# Patient Record
Sex: Male | Born: 1960 | Race: Asian | Hispanic: Yes | Marital: Married | Smoking: Never smoker
Health system: Southern US, Community
[De-identification: ages and names within clinical notes are randomized; demographics above are authoritative.]

## PROBLEM LIST (undated history)

## (undated) DIAGNOSIS — D649 Anemia, unspecified: Secondary | ICD-10-CM

## (undated) DIAGNOSIS — E119 Type 2 diabetes mellitus without complications: Secondary | ICD-10-CM

## (undated) DIAGNOSIS — I10 Essential (primary) hypertension: Secondary | ICD-10-CM

## (undated) DIAGNOSIS — N183 Chronic kidney disease, stage 3 unspecified: Secondary | ICD-10-CM

## (undated) DIAGNOSIS — G603 Idiopathic progressive neuropathy: Secondary | ICD-10-CM

## (undated) DIAGNOSIS — N2581 Secondary hyperparathyroidism of renal origin: Secondary | ICD-10-CM

## (undated) HISTORY — DX: Type 2 diabetes mellitus without complications: E11.9

## (undated) HISTORY — DX: Secondary hyperparathyroidism of renal origin: N25.81

## (undated) HISTORY — DX: Chronic kidney disease, stage 3 unspecified: N18.30

## (undated) HISTORY — DX: Essential (primary) hypertension: I10

## (undated) HISTORY — DX: Idiopathic progressive neuropathy: G60.3

## (undated) HISTORY — DX: Anemia, unspecified: D64.9

---

## 2020-06-03 NOTE — Progress Notes (Signed)
Patient referred by Tim Seashore, MD for leg edema, orthostatic hypotension  Subjective:   Tim Spencer, male    DOB: 1961-06-12, 59 y.o.   MRN: [id number]   Chief Complaint  Patient presents with  . Orthostatic Hypotension    With Orthostatic Vitals  . New Patient (Initial Visit)    Dr. Ashby Dawes     HPI  59 y.o. Tim Spencer male with hypertension, CKD 3, idiopathic progressive neuropathy, referred for evaluation of leg edema, orthostatic hypotension  Patient is here with his wife today.  He has been diabetic for over 10 years, has strugglesd with peripheral neuropathy. He denies chest pain, shortness of breath, palpitations, leg edema, orthopnea, PND, TIA/syncope.   He has had significant postural dizziness and significant stops in blood pressure with standing, to as low as 94/64 mmHg. Fortunately, he has never had a fall with it. His blood pressure while supine, is as high as SBP 200s at times, at night. He also had leg edema, which improved with stopping gabapentin but did not change with stopping amlodipine. While the amlodipine was held, the orthostatic hypotension was no better.      Past Medical History:  Diagnosis Date  . Anemia   . CKD (chronic kidney disease) stage 3, GFR 30-59 ml/min   . Hypertension   . Idiopathic progressive neuropathy   . Secondary hyperparathyroidism, renal ([place])   . Type 2 diabetes mellitus ([place])      History reviewed. No pertinent surgical history.   Social History   Tobacco Use  Smoking Status Never Smoker  Smokeless Tobacco Never Used    Social History   Substance and Sexual Activity  Alcohol Use Never     Family History  Problem Relation Age of Onset  . Stroke Mother   . Hypertension Mother   . Diabetes Father      Current Outpatient Medications on File Prior to Visit  Medication Sig Dispense Refill  . amLODipine (NORVASC) 5 MG tablet Take 5 mg by mouth daily.    . Ascorbic Acid (VITAMIN C CR)  500 MG TBCR Take 1 tablet by mouth daily.    Marland Kitchen aspirin EC 81 MG tablet Take 81 mg by mouth daily. Swallow whole.    Marland Kitchen atorvastatin (LIPITOR) 20 MG tablet Take 20 mg by mouth daily.    . Cholecalciferol (VITAMIN D3) 125 MCG (5000 UT) CAPS Take 1 capsule by mouth daily.    . Continuous Blood Gluc Sensor (FREESTYLE LIBRE 2 SENSOR) MISC Inject into the skin every 14 (fourteen) days.    . ferrous sulfate 325 (65 FE) MG tablet Take 325 mg by mouth daily with breakfast.    . gabapentin (NEURONTIN) 300 MG capsule Take 300 mg by mouth 2 (two) times daily.    Marland Kitchen HUMALOG KWIKPEN 100 UNIT/ML KwikPen Inject into the skin as directed.    Marland Kitchen levothyroxine (SYNTHROID) 100 MCG tablet Take 100 mcg by mouth every morning.    Marland Kitchen losartan (COZAAR) 100 MG tablet Take 100 mg by mouth daily.    . Magnesium 250 MG TABS Take 1 tablet by mouth daily.    . metoprolol succinate (TOPROL-XL) 50 MG 24 hr tablet Take 50 mg by mouth daily. Reduce to 1/2 tab daily, then stop (9/13/20210    . Multiple Vitamin (MULTIVITAMIN) tablet Take 1 tablet by mouth daily.    . Multiple Vitamins-Minerals (EQ VISION FORMULA 50+ PO) Take 1 tablet by mouth daily.    . Omega-3 1000 MG CAPS Take  2 capsules by mouth daily.    Nelva Nay SOLOSTAR 300 UNIT/ML Solostar Pen Inject 30 Units into the skin daily. 30    . vitamin B-6 (PYRIDOXINE) 25 MG tablet Take 25 mg by mouth daily.     No current facility-administered medications on file prior to visit.    Cardiovascular and other pertinent studies:  EKG 06/06/2020: Sinus rhythm 70 bpm Occaisonal PVC   Echocardiogram 03/03/2020:  Normal LVEF Mild left atrial enlargement Trace MR, TR. RVSP 23 mmHg   Recent labs: 03/29/2020: Glucose 131, BUN/Cr 24/1.6. EGFR 47. Na/K 135/4.9.  ALB: 3.3 Chol 117, TG 40, HDL 53, LDL 56 TSH 6.48 normal BNP 183    Review of Systems  Cardiovascular: Negative for chest pain, dyspnea on exertion, leg swelling, palpitations and syncope.  Musculoskeletal:        Right toe pain  Neurological: Positive for dizziness and light-headedness.         Vitals:   06/06/20 1258 06/06/20 1259  BP:    Pulse:    Resp:    SpO2: 100% 99%    Orthostatic vitals: Supine: 153/74 mmHg, HR 70/min Sitting: 129/81 mmHg, HR 69/min standing: 121/57 mmHg, HR 81/min    Body mass index is 24.28 kg/m. Filed Weights   06/06/20 1257  Weight: 155 lb (70.3 kg)     Objective:   Physical Exam Vitals and nursing note reviewed.  Constitutional:      General: He is not in acute distress. Neck:     Vascular: No JVD.  Cardiovascular:     Rate and Rhythm: Normal rate and regular rhythm.     Pulses:          Carotid pulses are on the right side with bruit.      Dorsalis pedis pulses are 2+ on the right side and 2+ on the left side.       Posterior tibial pulses are 2+ on the right side and 2+ on the left side.     Heart sounds: Normal heart sounds. No murmur heard.   Pulmonary:     Effort: Pulmonary effort is normal.     Breath sounds: Normal breath sounds. No wheezing or rales.           Assessment & Recommendations:   59 y.o. Tim Spencer male with hypertension, CKD 3, idiopathic progressive neuropathy, with orthostatic hypotension  Orthostatic hypotension/supine hypertension: Likely related to autonomic dysfunction in diabetic patient. Metoprolol and amlodipine, both could contribute.  His orthostatic hypotension did not improve while he was holding amlodipine.  Therefore, I suspect metoprolol could potentially be the culprit.  I recommend following.  Reduce metoprolol succinate to 1/2 tab of 50 mg, and then stop should there be no significant increase in hypertension. Recommend starting clonidine 0.1 mg at night. He would take additional 0.1 mg, should his BP be >180 mmHg at night.   Recommend liberal hydration and using compression stockings.   Right carotid bruit: Recommend carotid US  Toe pain: Do not suspect critical limb ischemia.  Likely musculoskeletal or neuropathic etiology.   F/u in 3-4 weeks   Thank you for referring the patient to [place]. Please feel free to contact with any questions.   Nigel Mormon, MD Pager: [phone] Office: [phone]

## 2020-06-06 ENCOUNTER — Other Ambulatory Visit: Payer: Self-pay

## 2020-06-06 ENCOUNTER — Encounter: Payer: Self-pay | Admitting: Cardiology

## 2020-06-06 ENCOUNTER — Ambulatory Visit: Payer: BC Managed Care – PPO | Admitting: Cardiology

## 2020-06-06 VITALS — BP 171/73 | HR 70 | Resp 16 | Ht 67.0 in | Wt 155.0 lb

## 2020-06-06 DIAGNOSIS — I951 Orthostatic hypotension: Secondary | ICD-10-CM | POA: Insufficient documentation

## 2020-06-06 DIAGNOSIS — I10 Essential (primary) hypertension: Secondary | ICD-10-CM

## 2020-06-06 DIAGNOSIS — R0989 Other specified symptoms and signs involving the circulatory and respiratory systems: Secondary | ICD-10-CM

## 2020-06-06 MED ORDER — CLONIDINE HCL 0.1 MG PO TABS: 0.1000 mg | ORAL_TABLET | Freq: Every day | ORAL | 2 refills | Status: DC

## 2020-06-07 ENCOUNTER — Other Ambulatory Visit: Payer: Self-pay

## 2020-06-07 DIAGNOSIS — I951 Orthostatic hypotension: Secondary | ICD-10-CM

## 2020-06-07 MED ORDER — CLONIDINE HCL 0.1 MG PO TABS: 0.1000 mg | ORAL_TABLET | Freq: Every day | ORAL | 2 refills | Status: DC

## 2020-06-20 ENCOUNTER — Ambulatory Visit: Payer: BC Managed Care – PPO

## 2020-06-20 ENCOUNTER — Other Ambulatory Visit: Payer: Self-pay

## 2020-06-20 DIAGNOSIS — R0989 Other specified symptoms and signs involving the circulatory and respiratory systems: Secondary | ICD-10-CM

## 2020-06-22 NOTE — Progress Notes (Signed)
Called and spoke with patient regarding his CAD results.

## 2020-07-04 ENCOUNTER — Telehealth: Payer: Self-pay

## 2020-07-04 DIAGNOSIS — E871 Hypo-osmolality and hyponatremia: Secondary | ICD-10-CM

## 2020-07-04 DIAGNOSIS — I951 Orthostatic hypotension: Secondary | ICD-10-CM

## 2020-07-04 DIAGNOSIS — I10 Essential (primary) hypertension: Secondary | ICD-10-CM

## 2020-07-04 NOTE — Telephone Encounter (Signed)
Patient called wanting to speak to you eagerly regarding his sodium levels patient states he had an appt with his pcp and they told him his levels were low patient is wanting to know if he can take sodium due to him having hypertension and not controlled patient would like to speak to you personally

## 2020-07-05 DIAGNOSIS — E871 Hypo-osmolality and hyponatremia: Secondary | ICD-10-CM | POA: Insufficient documentation

## 2020-07-05 MED ORDER — HYDRALAZINE HCL 25 MG PO TABS: 25.0000 mg | ORAL_TABLET | Freq: Three times a day (TID) | ORAL | 2 refills | Status: DC | PRN

## 2020-07-05 NOTE — Telephone Encounter (Addendum)
I had a long conversation with the patient.  It appears that his serum sodium level is down to 125, based on recent blood work obtained by his PCP.  Patient is very concerned about this finding.  Etiology is include possible SIADH, or medication induced hyponatremia from clonidine.  Patient is also very concerned about his elevated blood pressure readings, especially while laying down.  I have recommended him the following.  Reduce clonidine from 0.1 mg twice daily in the evening, to once daily.  Take losartan 100 mg in the evening, instead of morning.  Should he have systolic blood pressure greater than 180 mmHg while sitting or standing, he may take hydralazine 25 mg 3 times daily as needed.  I will obtain labs from PCP.  I also recommend repeating BMP on 07/18/2020 prior to his appointment with me on 07/21/2020.  I will forward our conversation to his PCP Dr. Ashby Dawes, nephrologist Dr. Elmarie Shiley, and endocrinologist Dr. Amalia Greenhouse.  Thanks MJP  Staff, please obtain labs from PCP, and forward this encounter to the physicians mentioned above. Thanks.  Time spent: 10 min   --------------------------------------------------  Addendum 07/06/2020:  06/30/2020: Glucose 169, BUN/Cr 26/2.0. EGFR 36. Na/K 125/5.5. Chloride 92. Ca 10.1. Rest of the CMP normal H/H 10.6/29.8. MCV 84. Platelets 262 HbA1C 7.3% Chol 99, TG 83, HDL 41, LDL 45 TSH 11.2 high  05/10/2020: Glucose 230, BUN/Cr 33/1.84. EGFR 39. Na/K 127/5.3. Cl 92.   03/29/2020: BNP 183  I received further labs from Dr. Mathis Fare office. Review of above labs suggests that his hyponatremia predates initiation of clonidine. Thus, less likely to be the primary etiology. His BNP was 183 in 03/2020, however clinical exam and echocardiogram do not suggest florid heart failure, and therefore, hypervolemic or dilutaitonal hyponatremia. His CKD has progressed, Cr now 2.0. He has multiple electrolyte abnormalities, that may be at least  partially related to his CKD. I would request Dr. Elmarie Shiley to guide with management of this situation.    Nigel Mormon, MD Pager: [phone] Office: [phone]

## 2020-07-06 ENCOUNTER — Other Ambulatory Visit: Payer: Self-pay

## 2020-07-07 NOTE — Telephone Encounter (Signed)
Thank you :)

## 2020-07-07 NOTE — Telephone Encounter (Signed)
This has been completed.   Tim Spencer

## 2020-07-21 ENCOUNTER — Other Ambulatory Visit: Payer: Self-pay

## 2020-07-21 ENCOUNTER — Ambulatory Visit: Payer: BC Managed Care – PPO | Admitting: Cardiology

## 2020-07-21 ENCOUNTER — Encounter: Payer: Self-pay | Admitting: Cardiology

## 2020-07-21 VITALS — Resp 17 | Ht 67.0 in | Wt 161.0 lb

## 2020-07-21 DIAGNOSIS — I951 Orthostatic hypotension: Secondary | ICD-10-CM

## 2020-07-21 DIAGNOSIS — E871 Hypo-osmolality and hyponatremia: Secondary | ICD-10-CM

## 2020-07-21 DIAGNOSIS — I10 Essential (primary) hypertension: Secondary | ICD-10-CM

## 2020-07-21 MED ORDER — FUROSEMIDE 20 MG PO TABS: 20.0000 mg | ORAL_TABLET | Freq: Every day | ORAL | 3 refills | Status: DC

## 2020-07-21 MED ORDER — HYDRALAZINE HCL 25 MG PO TABS: 25.0000 mg | ORAL_TABLET | Freq: Three times a day (TID) | ORAL | 2 refills | Status: DC

## 2020-07-21 NOTE — Progress Notes (Signed)
Patient referred by Tim Seashore, MD for leg edema, orthostatic hypotension  Subjective:   Tim Spencer, male    DOB: 1961/08/09, 59 y.o.   MRN: [id number]   Chief Complaint  Patient presents with  . hyponatremia  . Follow-up    3 -4 week     HPI  59 y.o. Tim Spencer male with hypertension, CKD 3, idiopathic progressive neuropathy, orthostatic hypotension/ supine hypertension, hyponatremia.  Hypertension chronic renal, and detailed, contacted patient concerning his hyponatremia.  Recommended him to reduce clonidine.  Subsequent lab work shows increase in sodium from 125 to 130, and improvement in creatinine from 2.0-1.6.  Serum osmolality is reduced at 267.  Patient has noticed recent leg edema, after he increased his sodium intake, as he felt that would increase his serum sodium level.  Home blood pressure log reviewed.  Blood pressure ranges from 1 40-80/70/90 mmHg.  Patient has a lot of anxiety related to his blood pressure numbers.  He constantly worries about his hypertension causing him stroke and what that would mean for his wife.  On a separate not, he recently had an equivocal cologuard test. He is going to see gastroenterologist Dr. Collene Spencer  Please see telephone encounter 06/30/2020: I had a long conversation with the patient.  It appears that his serum sodium level is down to 125, based on recent blood work obtained by his PCP.  Patient is very concerned about this finding.  Etiology is include possible SIADH, or medication induced hyponatremia from clonidine.  Patient is also very concerned about his elevated blood pressure readings, especially while laying down.  I have recommended him the following.  Reduce clonidine from 0.1 mg twice daily in the evening, to once daily.  Take losartan 100 mg in the evening, instead of morning.  Should he have systolic blood pressure greater than 180 mmHg while sitting or standing, he may take hydralazine 25 mg 3 times daily  as needed.  I will obtain labs from PCP.  I also recommend repeating BMP on 07/18/2020 prior to his appointment with me on 07/21/2020.  I will forward our conversation to his PCP Dr. Ashby Spencer, nephrologist Dr. Elmarie Spencer, and endocrinologist Dr. Amalia Spencer.  Thanks MJP  Staff, please obtain labs from PCP, and forward this encounter to the physicians mentioned above. Thanks.  Time spent: 10 min   --------------------------------------------------  Addendum 07/06/2020:  06/30/2020: Glucose 169, BUN/Cr 26/2.0. EGFR 36. Na/K 125/5.5. Chloride 92. Ca 10.1. Rest of the CMP normal H/H 10.6/29.8. MCV 84. Platelets 262 HbA1C 7.3% Chol 99, TG 83, HDL 41, LDL 45 TSH 11.2 high  05/10/2020: Glucose 230, BUN/Cr 33/1.84. EGFR 39. Na/K 127/5.3. Cl 92.   03/29/2020: BNP 183  I received further labs from Dr. Mathis Spencer office. Review of above labs suggests that his hyponatremia predates initiation of clonidine. Thus, less likely to be the primary etiology. His BNP was 183 in 03/2020, however clinical exam and echocardiogram do not suggest florid heart failure, and therefore, hypervolemic or dilutaitonal hyponatremia. His CKD has progressed, Cr now 2.0. He has multiple electrolyte abnormalities, that may be at least partially related to his CKD. I would request Dr. Elmarie Spencer to guide with management of this situation.   Initial consultation HPI 05/2020:  Patient is here with his wife today.  He has been diabetic for over 10 years, has strugglesd with peripheral neuropathy. He denies chest pain, shortness of breath, palpitations, leg edema, orthopnea, PND, TIA/syncope.   He has had significant postural dizziness and  significant stops in blood pressure with standing, to as low as 94/64 mmHg. Fortunately, he has never had a fall with it. His blood pressure while supine, is as high as SBP 200s at times, at night. He also had leg edema, which improved with stopping gabapentin but did not  change with stopping amlodipine. While the amlodipine was held, the orthostatic hypotension was no better.      Current Outpatient Medications on File Prior to Visit  Medication Sig Dispense Refill  . amLODipine (NORVASC) 5 MG tablet Take 5 mg by mouth in the morning and at bedtime.     . Ascorbic Acid (VITAMIN C CR) 500 MG TBCR Take 1 tablet by mouth daily.    Marland Kitchen aspirin EC 81 MG tablet Take 81 mg by mouth daily. Swallow whole.    Marland Kitchen atorvastatin (LIPITOR) 20 MG tablet Take 20 mg by mouth daily.    . cloNIDine (CATAPRES) 0.1 MG tablet Take 1 tablet (0.1 mg total) by mouth daily. Take 0.1 mg at night. May take additional 0.1 mg at night,>180 90 tablet 2  . Continuous Blood Gluc Sensor (FREESTYLE LIBRE 2 SENSOR) MISC Inject into the skin every 14 (fourteen) days.    . ferrous sulfate 325 (65 FE) MG tablet Take 325 mg by mouth daily with breakfast.    . gabapentin (NEURONTIN) 300 MG capsule Take 300 mg by mouth 2 (two) times daily.    Marland Kitchen HUMALOG KWIKPEN 100 UNIT/ML KwikPen Inject into the skin as directed.    . hydrALAZINE (APRESOLINE) 25 MG tablet Take 1 tablet (25 mg total) by mouth 3 (three) times daily as needed. 90 tablet 2  . levothyroxine (SYNTHROID) 125 MCG tablet Take 125 mcg by mouth every morning.     Marland Kitchen losartan (COZAAR) 100 MG tablet Take 100 mg by mouth daily.    . Magnesium 250 MG TABS Take 1 tablet by mouth daily.    . metoprolol succinate (TOPROL-XL) 50 MG 24 hr tablet Take 50 mg by mouth daily. Reduce to 1/2 tab daily, then stop (9/13/20210    . Multiple Vitamin (MULTIVITAMIN) tablet Take 1 tablet by mouth daily.    . Multiple Vitamins-Minerals (EQ VISION FORMULA 50+ PO) Take 1 tablet by mouth daily.    . Omega-3 1000 MG CAPS Take 2 capsules by mouth daily.    Nelva Nay SOLOSTAR 300 UNIT/ML Solostar Pen Inject 30 Units into the skin daily. 30    . vitamin B-6 (PYRIDOXINE) 25 MG tablet Take 25 mg by mouth daily.     No current facility-administered medications on file prior to  visit.    Cardiovascular and other pertinent studies:  Carotid artery duplex 06/20/2020:  Minimal stenosis in the right internal carotid artery (1-15%).  Minimal stenosis in the left internal carotid artery (1-15%).  Mild heterogeneous plaque noted in R>L carotid arteries.  Antegrade right vertebral artery flow. Antegrade left vertebral artery  Flow.  EKG 06/06/2020: Sinus rhythm 70 bpm Occaisonal PVC   Echocardiogram 03/03/2020:  Normal LVEF Mild left atrial enlargement Trace MR, TR. RVSP 23 mmHg   Recent labs: 07/18/2020: Glucose 130 BUN/creatinine 27/1.6.  eGFR 47/56.  NA/K 130/4.5. H/H 10.6/29.8 MCV 84.  Platelets 262. Serum osmolality 267 (275-295)  06/30/2020: Glucose 169, BUN/Cr 26/2.0. EGFR 36. Na/K 125/5.5. Chloride 92. Ca 10.1. Rest of the CMP normal H/H 10.6/29.8. MCV 84. Platelets 262 HbA1C 7.3% Chol 99, TG 83, HDL 41, LDL 45 TSH 11.2 high  05/10/2020: Glucose 230, BUN/Cr 33/1.84. EGFR 39. Na/K 127/5.3.  Cl 92.   03/29/2020: BNP 183  03/29/2020: Glucose 131, BUN/Cr 24/1.6. EGFR 47. Na/K 135/4.9.  ALB: 3.3 Chol 117, TG 40, HDL 53, LDL 56 TSH 6.48 normal BNP 183    Review of Systems  Cardiovascular: Negative for chest pain, dyspnea on exertion, leg swelling, palpitations and syncope.  Musculoskeletal:       Right toe pain  Neurological: Positive for dizziness and light-headedness.         Vitals:   07/21/20 0948 07/21/20 0949  Resp:    SpO2: 98% 98%    Orthostatic VS for the past 72 hrs (Last 3 readings):  Orthostatic BP Patient Position BP Location Cuff Size Orthostatic Pulse  07/21/20 0949 164/73 Standing Right Arm Normal 81  07/21/20 0948 175/83 Sitting Right Arm Normal 79  07/21/20 0947 185/89 Supine Right Arm Normal 87      Body mass index is 25.22 kg/m. Filed Weights   07/21/20 0947  Weight: 161 lb (73 kg)     Objective:   Physical Exam Vitals and nursing note reviewed.  Constitutional:      General: He is not in  acute distress. Neck:     Vascular: No JVD.  Cardiovascular:     Rate and Rhythm: Normal rate and regular rhythm.     Pulses:          Carotid pulses are on the right side with bruit.      Dorsalis pedis pulses are 2+ on the right side and 2+ on the left side.       Posterior tibial pulses are 2+ on the right side and 2+ on the left side.     Heart sounds: Normal heart sounds. No murmur heard.   Pulmonary:     Effort: Pulmonary effort is normal.     Breath sounds: Normal breath sounds. No wheezing or rales.           Assessment & Recommendations:   59 y.o. Tim Spencer male with hypertension, CKD 3, idiopathic progressive neuropathy, orthostatic hypotension/ supine hypertension, hyponatremia.  Orthostatic hypotension/supine hypertension: Likely related to autonomic dysfunction in diabetic patient. Metoprolol and amlodipine, both could contribute.  His orthostatic hypotension did not improve while he was holding amlodipine.  Difficult to stop metoprolol when his BP still remains elevated.  Started hydralazine 25 mg tid.  Take losartan, metoprolol, and clonidine at night, amlodipine in the morning. He know sto take additional 1-2 25 mg doses of hydralazome   Hyponatremia: Likely dilutional. While his echocardiogram was unremarkable, leg edema, elevated BNP, and low sr osmolality point towards hyponatremia related to volume overload. Added lasix 20 mg daily. Repeat BMP, BNP, Sr osmolality in 3 weeks  On a separate note, I have asked him to stop Aspirin, given equivocal cologuard test. In absence of known ASCVD, risks of Aspirin outweigh benefits.  F/u in 4 weeks  Time spent: 27 min   Nigel Mormon, MD Pager: [phone] Office: [phone]

## 2020-08-25 ENCOUNTER — Ambulatory Visit: Payer: BC Managed Care – PPO | Admitting: Cardiology

## 2020-08-29 ENCOUNTER — Ambulatory Visit: Payer: BC Managed Care – PPO | Admitting: Cardiology

## 2020-08-29 ENCOUNTER — Telehealth: Payer: Self-pay

## 2020-08-29 NOTE — Telephone Encounter (Signed)
Telephone encounter:  Reason for call: patient called to ask if you still need to see him . He said that his sodium level is now 141.   Usual provider: MP  Last office visit: 07/21/20  Next office visit: NA   Last hospitalization:NA  Current Outpatient Medications on File Prior to Visit  Medication Sig Dispense Refill  . amLODipine (NORVASC) 5 MG tablet Take 5 mg by mouth every evening.     . Ascorbic Acid (VITAMIN C CR) 500 MG TBCR Take 500 mg by mouth daily after lunch.     Marland Kitchen atorvastatin (LIPITOR) 20 MG tablet Take 20 mg by mouth daily after lunch.     . B Complex-C (B-COMPLEX WITH VITAMIN C) tablet Take 1 tablet by mouth daily after supper.    . cloNIDine (CATAPRES) 0.1 MG tablet Take 1 tablet (0.1 mg total) by mouth daily. Take 0.1 mg at night. May take additional 0.1 mg at night,>180 (Patient taking differently: Take 0.1 mg by mouth every evening. ) 90 tablet 2  . Continuous Blood Gluc Sensor (FREESTYLE LIBRE 2 SENSOR) MISC Inject into the skin every 14 (fourteen) days.    . ferrous sulfate 325 (65 FE) MG tablet Take 650 mg by mouth daily after supper.     . furosemide (LASIX) 20 MG tablet Take 1 tablet (20 mg total) by mouth daily. (Patient taking differently: Take 20 mg by mouth daily after lunch. ) 90 tablet 3  . HUMALOG KWIKPEN 100 UNIT/ML KwikPen Inject 10-15 Units into the skin 3 (three) times daily before meals. Sliding Scale Insulin    . hydrALAZINE (APRESOLINE) 25 MG tablet Take 1 tablet (25 mg total) by mouth 3 (three) times daily. 270 tablet 2  . levothyroxine (SYNTHROID) 125 MCG tablet Take 125 mcg by mouth daily before breakfast.     . losartan (COZAAR) 100 MG tablet Take 100 mg by mouth daily.    . magnesium oxide (MAG-OX) 400 MG tablet Take 400 mg by mouth daily after lunch.    . metoprolol succinate (TOPROL-XL) 50 MG 24 hr tablet Take 25 mg by mouth daily.     . Multiple Vitamin (MULTIVITAMIN WITH MINERALS) TABS tablet Take 1 tablet by mouth daily after lunch.    .  Multiple Vitamins-Minerals (EQ VISION FORMULA 50+ PO) Take 1 tablet by mouth every evening.     . Omega-3 1000 MG CAPS Take 2,000 mg by mouth at bedtime.     Nelva Nay SOLOSTAR 300 UNIT/ML Solostar Pen Inject 30 Units into the skin daily. 30    . vitamin B-12 (CYANOCOBALAMIN) 1000 MCG tablet Take 1,000 mcg by mouth daily.     No current facility-administered medications on file prior to visit.

## 2020-08-29 NOTE — Telephone Encounter (Signed)
I leave it to the patient. Either is fine with me.  Thanks MJP

## 2020-08-30 NOTE — Progress Notes (Signed)
Mr/ Posey Pronto left a voice message asking to cal him, he has questions. I called Mr. Degroat back and informed him that he needs to call Dr. Lorie Apley office with questions, that our department does not work with the procedure he is having. "I talked to someone at [facility] for those questions, I just nee to Know if there is valet parking and if there is a wheel chair I can use when I arrive."   I said yes to both questions.

## 2020-09-01 ENCOUNTER — Encounter (HOSPITAL_COMMUNITY)
Admission: RE | Disposition: A | Discharge status: Home / Self Care | Payer: Self-pay | Source: Home / Self Care | Attending: Gastroenterology

## 2020-09-01 ENCOUNTER — Ambulatory Visit (HOSPITAL_COMMUNITY)
Admission: RE | Admit: 2020-09-01 | Discharge: 2020-09-01 | Disposition: A | Discharge status: Home / Self Care | Payer: BC Managed Care – PPO | Attending: Gastroenterology | Admitting: Gastroenterology

## 2020-09-01 DIAGNOSIS — K633 Ulcer of intestine: Secondary | ICD-10-CM | POA: Diagnosis not present

## 2020-09-01 DIAGNOSIS — D509 Iron deficiency anemia, unspecified: Secondary | ICD-10-CM | POA: Diagnosis not present

## 2020-09-01 HISTORY — PX: GIVENS CAPSULE STUDY: SHX5432

## 2020-09-01 SURGERY — IMAGING PROCEDURE, GI TRACT, INTRALUMINAL, VIA CAPSULE

## 2020-09-01 SURGICAL SUPPLY — 1 items: TOWEL COTTON PACK 4EA (MISCELLANEOUS) ×6 IMPLANT

## 2020-09-01 NOTE — Progress Notes (Signed)
Pt scheduled for Givens capsule study at [facility].  Givens capsule ingested at 7:45am.  No issues swallowing.  Instructions given.  Pt verbalized understanding.  Pt to return belt and monitor to [facility] later today or tomorrow.

## 2020-09-04 ENCOUNTER — Encounter (HOSPITAL_COMMUNITY): Payer: Self-pay | Admitting: Gastroenterology

## 2020-09-13 ENCOUNTER — Other Ambulatory Visit: Payer: Self-pay | Admitting: Gastroenterology

## 2020-09-13 DIAGNOSIS — K6389 Other specified diseases of intestine: Secondary | ICD-10-CM

## 2020-09-13 DIAGNOSIS — D372 Neoplasm of uncertain behavior of small intestine: Secondary | ICD-10-CM

## 2020-09-14 ENCOUNTER — Other Ambulatory Visit: Payer: Self-pay

## 2020-09-14 ENCOUNTER — Ambulatory Visit
Admission: RE | Admit: 2020-09-14 | Discharge: 2020-09-14 | Disposition: A | Discharge status: Home / Self Care | Payer: BC Managed Care – PPO | Source: Ambulatory Visit | Attending: Gastroenterology | Admitting: Gastroenterology

## 2020-09-14 DIAGNOSIS — K6389 Other specified diseases of intestine: Secondary | ICD-10-CM

## 2020-09-14 MED ORDER — IOPAMIDOL (ISOVUE-300) INJECTION 61%
100.0000 mL | Freq: Once | INTRAVENOUS | Status: AC | PRN
  Administered 2020-09-14: 09:00:00 100 mL via INTRAVENOUS

## 2020-09-21 ENCOUNTER — Ambulatory Visit: Payer: BC Managed Care – PPO | Admitting: Cardiology

## 2021-06-28 ENCOUNTER — Inpatient Hospital Stay (HOSPITAL_COMMUNITY)
Admission: EM | Admit: 2021-06-28 | Discharge: 2021-07-01 | DRG: 641 | Disposition: A | Discharge status: Home / Self Care | Payer: BC Managed Care – PPO | Attending: Internal Medicine | Admitting: Internal Medicine

## 2021-06-28 ENCOUNTER — Other Ambulatory Visit: Payer: Self-pay

## 2021-06-28 ENCOUNTER — Encounter (HOSPITAL_COMMUNITY): Payer: Self-pay | Admitting: Oncology

## 2021-06-28 DIAGNOSIS — E871 Hypo-osmolality and hyponatremia: Principal | ICD-10-CM | POA: Diagnosis present

## 2021-06-28 DIAGNOSIS — E1122 Type 2 diabetes mellitus with diabetic chronic kidney disease: Secondary | ICD-10-CM | POA: Diagnosis present

## 2021-06-28 DIAGNOSIS — E119 Type 2 diabetes mellitus without complications: Secondary | ICD-10-CM | POA: Diagnosis not present

## 2021-06-28 DIAGNOSIS — I10 Essential (primary) hypertension: Secondary | ICD-10-CM

## 2021-06-28 DIAGNOSIS — N2581 Secondary hyperparathyroidism of renal origin: Secondary | ICD-10-CM | POA: Diagnosis present

## 2021-06-28 DIAGNOSIS — E861 Hypovolemia: Secondary | ICD-10-CM | POA: Diagnosis present

## 2021-06-28 DIAGNOSIS — Z23 Encounter for immunization: Secondary | ICD-10-CM

## 2021-06-28 DIAGNOSIS — E1165 Type 2 diabetes mellitus with hyperglycemia: Secondary | ICD-10-CM | POA: Diagnosis present

## 2021-06-28 DIAGNOSIS — G603 Idiopathic progressive neuropathy: Secondary | ICD-10-CM | POA: Diagnosis present

## 2021-06-28 DIAGNOSIS — Z833 Family history of diabetes mellitus: Secondary | ICD-10-CM

## 2021-06-28 DIAGNOSIS — I129 Hypertensive chronic kidney disease with stage 1 through stage 4 chronic kidney disease, or unspecified chronic kidney disease: Secondary | ICD-10-CM | POA: Diagnosis present

## 2021-06-28 DIAGNOSIS — Z20822 Contact with and (suspected) exposure to covid-19: Secondary | ICD-10-CM | POA: Diagnosis present

## 2021-06-28 DIAGNOSIS — Z8249 Family history of ischemic heart disease and other diseases of the circulatory system: Secondary | ICD-10-CM

## 2021-06-28 DIAGNOSIS — I160 Hypertensive urgency: Secondary | ICD-10-CM | POA: Diagnosis not present

## 2021-06-28 DIAGNOSIS — N1831 Chronic kidney disease, stage 3a: Secondary | ICD-10-CM | POA: Diagnosis not present

## 2021-06-28 DIAGNOSIS — Z794 Long term (current) use of insulin: Secondary | ICD-10-CM

## 2021-06-28 DIAGNOSIS — Z79890 Hormone replacement therapy (postmenopausal): Secondary | ICD-10-CM

## 2021-06-28 DIAGNOSIS — Z79899 Other long term (current) drug therapy: Secondary | ICD-10-CM

## 2021-06-28 DIAGNOSIS — E039 Hypothyroidism, unspecified: Secondary | ICD-10-CM | POA: Diagnosis present

## 2021-06-28 DIAGNOSIS — G4700 Insomnia, unspecified: Secondary | ICD-10-CM | POA: Diagnosis present

## 2021-06-28 DIAGNOSIS — R350 Frequency of micturition: Secondary | ICD-10-CM | POA: Diagnosis present

## 2021-06-28 DIAGNOSIS — D649 Anemia, unspecified: Secondary | ICD-10-CM | POA: Diagnosis present

## 2021-06-28 LAB — CBC WITH DIFFERENTIAL/PLATELET
Abs Immature Granulocytes: 0.03 10*3/uL (ref 0.00–0.07)
Basophils Absolute: 0 10*3/uL (ref 0.0–0.1)
Basophils Relative: 0 %
Eosinophils Absolute: 0 10*3/uL (ref 0.0–0.5)
Eosinophils Relative: 0 %
HCT: 32.4 % — ABNORMAL LOW (ref 39.0–52.0)
Hemoglobin: 11.3 g/dL — ABNORMAL LOW (ref 13.0–17.0)
Immature Granulocytes: 0 %
Lymphocytes Relative: 19 %
Lymphs Abs: 1.8 10*3/uL (ref 0.7–4.0)
MCH: 30.6 pg (ref 26.0–34.0)
MCHC: 34.9 g/dL (ref 30.0–36.0)
MCV: 87.8 fL (ref 80.0–100.0)
Monocytes Absolute: 0.8 10*3/uL (ref 0.1–1.0)
Monocytes Relative: 8 %
Neutro Abs: 7.1 10*3/uL (ref 1.7–7.7)
Neutrophils Relative %: 73 %
Platelets: 233 10*3/uL (ref 150–400)
RBC: 3.69 MIL/uL — ABNORMAL LOW (ref 4.22–5.81)
RDW: 11.9 % (ref 11.5–15.5)
WBC: 9.7 10*3/uL (ref 4.0–10.5)
nRBC: 0 % (ref 0.0–0.2)

## 2021-06-28 LAB — URINALYSIS, ROUTINE W REFLEX MICROSCOPIC
Bacteria, UA: NONE SEEN
Bilirubin Urine: NEGATIVE
Glucose, UA: 150 mg/dL — AB
Hgb urine dipstick: NEGATIVE
Ketones, ur: 5 mg/dL — AB
Leukocytes,Ua: NEGATIVE
Nitrite: NEGATIVE
Protein, ur: >=300 mg/dL — AB
Specific Gravity, Urine: 1.009 (ref 1.005–1.030)
pH: 7 (ref 5.0–8.0)

## 2021-06-28 LAB — RESP PANEL BY RT-PCR (FLU A&B, COVID) ARPGX2
Influenza A by PCR: NEGATIVE
Influenza B by PCR: NEGATIVE
SARS Coronavirus 2 by RT PCR: NEGATIVE

## 2021-06-28 LAB — COMPREHENSIVE METABOLIC PANEL
ALT: 28 U/L (ref 0–44)
AST: 40 U/L (ref 15–41)
Albumin: 3.4 g/dL — ABNORMAL LOW (ref 3.5–5.0)
Alkaline Phosphatase: 55 U/L (ref 38–126)
Anion gap: 11 (ref 5–15)
BUN: 21 mg/dL (ref 8–23)
CO2: 21 mmol/L — ABNORMAL LOW (ref 22–32)
Calcium: 10 mg/dL (ref 8.9–10.3)
Chloride: 91 mmol/L — ABNORMAL LOW (ref 98–111)
Creatinine, Ser: 1.73 mg/dL — ABNORMAL HIGH (ref 0.61–1.24)
GFR, Estimated: 44 mL/min — ABNORMAL LOW (ref >60)
Glucose, Bld: 221 mg/dL — ABNORMAL HIGH (ref 70–99)
Potassium: 4.2 mmol/L (ref 3.5–5.1)
Sodium: 123 mmol/L — ABNORMAL LOW (ref 135–145)
Total Bilirubin: 0.9 mg/dL (ref 0.3–1.2)
Total Protein: 6.6 g/dL (ref 6.5–8.1)

## 2021-06-28 LAB — LACTIC ACID, PLASMA: Lactic Acid, Venous: 1.2 mmol/L (ref 0.5–1.9)

## 2021-06-28 LAB — CBG MONITORING, ED
Glucose-Capillary: 185 mg/dL — ABNORMAL HIGH (ref 70–99)
Glucose-Capillary: 299 mg/dL — ABNORMAL HIGH (ref 70–99)

## 2021-06-28 LAB — ETHANOL: Alcohol, Ethyl (B): <10 mg/dL (ref <10)

## 2021-06-28 LAB — BRAIN NATRIURETIC PEPTIDE: B Natriuretic Peptide: 429.3 pg/mL — ABNORMAL HIGH (ref 0.0–100.0)

## 2021-06-28 MED ORDER — ONDANSETRON 8 MG PO TBDP
8.0000 mg | ORAL_TABLET | Freq: Once | ORAL | Status: AC
  Administered 2021-06-28: 8 mg via ORAL
  Filled 2021-06-28: qty 1

## 2021-06-28 MED ORDER — SODIUM CHLORIDE 0.9 % IV SOLN: INTRAVENOUS | Status: AC

## 2021-06-28 MED ORDER — HYDRALAZINE HCL 25 MG PO TABS
25.0000 mg | ORAL_TABLET | Freq: Four times a day (QID) | ORAL | Status: DC | PRN
  Administered 2021-06-28 – 2021-06-29 (×3): 25 mg via ORAL
  Filled 2021-06-28 (×2): qty 1

## 2021-06-28 MED ORDER — SENNOSIDES-DOCUSATE SODIUM 8.6-50 MG PO TABS
1.0000 | ORAL_TABLET | Freq: Every evening | ORAL | Status: DC | PRN
  Administered 2021-07-01: 1 via ORAL
  Filled 2021-06-28: qty 1

## 2021-06-28 MED ORDER — ATORVASTATIN CALCIUM 10 MG PO TABS
20.0000 mg | ORAL_TABLET | Freq: Every day | ORAL | Status: DC
  Administered 2021-06-29 – 2021-07-01 (×3): 20 mg via ORAL
  Filled 2021-06-28 (×3): qty 2

## 2021-06-28 MED ORDER — DILTIAZEM HCL 60 MG PO TABS
60.0000 mg | ORAL_TABLET | Freq: Every day | ORAL | Status: DC
  Administered 2021-06-29: 60 mg via ORAL
  Filled 2021-06-28: qty 2
  Filled 2021-06-28: qty 1

## 2021-06-28 MED ORDER — ACETAMINOPHEN 325 MG PO TABS: 650.0000 mg | ORAL_TABLET | Freq: Four times a day (QID) | ORAL | Status: DC | PRN

## 2021-06-28 MED ORDER — INSULIN GLARGINE-YFGN 100 UNIT/ML ~~LOC~~ SOLN
15.0000 [IU] | Freq: Every day | SUBCUTANEOUS | Status: DC
  Administered 2021-06-29: 15 [IU] via SUBCUTANEOUS
  Filled 2021-06-28: qty 0.15

## 2021-06-28 MED ORDER — LEVOTHYROXINE SODIUM 125 MCG PO TABS
125.0000 ug | ORAL_TABLET | Freq: Every day | ORAL | Status: DC
  Administered 2021-06-29 – 2021-07-01 (×3): 125 ug via ORAL
  Filled 2021-06-28 (×3): qty 1

## 2021-06-28 MED ORDER — ENOXAPARIN SODIUM 40 MG/0.4ML IJ SOSY
40.0000 mg | PREFILLED_SYRINGE | INTRAMUSCULAR | Status: DC
  Administered 2021-06-28 – 2021-06-30 (×3): 40 mg via SUBCUTANEOUS
  Filled 2021-06-28 (×2): qty 0.4

## 2021-06-28 MED ORDER — CLONIDINE HCL 0.1 MG PO TABS
0.1000 mg | ORAL_TABLET | Freq: Every evening | ORAL | Status: DC
  Administered 2021-06-29: 0.1 mg via ORAL
  Filled 2021-06-28: qty 1

## 2021-06-28 MED ORDER — AMLODIPINE BESYLATE 5 MG PO TABS: 5.0000 mg | ORAL_TABLET | Freq: Every evening | ORAL | Status: DC

## 2021-06-28 MED ORDER — INSULIN ASPART 100 UNIT/ML IJ SOLN
0.0000 [IU] | Freq: Every day | INTRAMUSCULAR | Status: DC
  Administered 2021-06-28 – 2021-06-30 (×3): 3 [IU] via SUBCUTANEOUS
  Filled 2021-06-28: qty 0.05

## 2021-06-28 MED ORDER — GABAPENTIN 300 MG PO CAPS
300.0000 mg | ORAL_CAPSULE | Freq: Three times a day (TID) | ORAL | Status: DC
  Administered 2021-06-28 – 2021-07-01 (×9): 300 mg via ORAL
  Filled 2021-06-28 (×9): qty 1

## 2021-06-28 MED ORDER — INSULIN GLARGINE-YFGN 100 UNIT/ML ~~LOC~~ SOLN
15.0000 [IU] | Freq: Every day | SUBCUTANEOUS | Status: DC
  Filled 2021-06-28: qty 0.15

## 2021-06-28 MED ORDER — LABETALOL HCL 5 MG/ML IV SOLN
10.0000 mg | Freq: Once | INTRAVENOUS | Status: AC
  Administered 2021-06-28: 10 mg via INTRAVENOUS
  Filled 2021-06-28: qty 4

## 2021-06-28 MED ORDER — SODIUM CHLORIDE 0.9 % IV SOLN: Freq: Once | INTRAVENOUS | Status: DC

## 2021-06-28 MED ORDER — ACETAMINOPHEN 650 MG RE SUPP: 650.0000 mg | Freq: Four times a day (QID) | RECTAL | Status: DC | PRN

## 2021-06-28 MED ORDER — INFLUENZA VAC SPLIT QUAD 0.5 ML IM SUSY
0.5000 mL | PREFILLED_SYRINGE | INTRAMUSCULAR | Status: AC
  Administered 2021-06-29: 0.5 mL via INTRAMUSCULAR
  Filled 2021-06-28: qty 0.5

## 2021-06-28 MED ORDER — LORAZEPAM 0.5 MG PO TABS
0.5000 mg | ORAL_TABLET | Freq: Every evening | ORAL | Status: DC | PRN
  Administered 2021-06-28: 0.5 mg via ORAL
  Filled 2021-06-28: qty 1

## 2021-06-28 MED ORDER — METOPROLOL SUCCINATE ER 25 MG PO TB24
25.0000 mg | ORAL_TABLET | Freq: Every day | ORAL | Status: DC
  Administered 2021-06-28 – 2021-06-29 (×2): 25 mg via ORAL
  Filled 2021-06-28 (×2): qty 1

## 2021-06-28 MED ORDER — INSULIN ASPART 100 UNIT/ML IJ SOLN
0.0000 [IU] | Freq: Three times a day (TID) | INTRAMUSCULAR | Status: DC
  Administered 2021-06-29 (×2): 5 [IU] via SUBCUTANEOUS
  Administered 2021-06-29: 3 [IU] via SUBCUTANEOUS
  Administered 2021-06-30: 2 [IU] via SUBCUTANEOUS
  Administered 2021-06-30 – 2021-07-01 (×2): 3 [IU] via SUBCUTANEOUS
  Administered 2021-07-01: 2 [IU] via SUBCUTANEOUS
  Filled 2021-06-28: qty 0.09

## 2021-06-28 MED ORDER — HYDRALAZINE HCL 25 MG PO TABS
25.0000 mg | ORAL_TABLET | Freq: Three times a day (TID) | ORAL | Status: DC
  Administered 2021-06-29 (×2): 25 mg via ORAL
  Filled 2021-06-28 (×3): qty 1

## 2021-06-28 NOTE — ED Provider Notes (Addendum)
[name]   CSN: [id number] Arrival date & time: 06/28/21  1614     History Chief Complaint  Patient presents with   Urinary Frequency    Tim Spencer is a 60 y.o. male.  Patient presents to ER chief complaint of generalized weakness urinary frequency and tremors.  Symptoms ongoing for the past 3 days.  Otherwise denies any pain at this time.  No complaints of headache no chest pain.  He previously had some abdominal pain but currently denies.  Denies any flank pain or back pain.  Denies any fall or trauma.  He states he had been on Lasix for the past couple of weeks but has not taken it for the past 3 to 4 days.      Past Medical History:  Diagnosis Date   Anemia    CKD (chronic kidney disease) stage 3, GFR 30-59 ml/min (HCC)    Hypertension    Idiopathic progressive neuropathy    Secondary hyperparathyroidism, renal (HCC)    Type 2 diabetes mellitus ([place])     Patient Active Problem List   Diagnosis Date Noted   Hypertensive urgency 06/28/2021   Chronic kidney disease, stage 3a ([place])    Hyponatremia 07/05/2020   Orthostatic hypotension 06/06/2020   Bruit of right carotid artery 06/06/2020    Past Surgical History:  Procedure Laterality Date   GIVENS CAPSULE STUDY N/A 09/01/2020   Procedure: GIVENS CAPSULE STUDY;  Surgeon: Juanita Craver, MD;  Location: [facility] ENDOSCOPY;  Service: Endoscopy;  Laterality: N/A;       Family History  Problem Relation Age of Onset   Stroke Mother    Hypertension Mother    Diabetes Father     Social History   Tobacco Use   Smoking status: Never   Smokeless tobacco: Never  Vaping Use   Vaping Use: Never used  Substance Use Topics   Alcohol use: Never   Drug use: Never    Home Medications Prior to Admission medications   Medication Sig Start Date End Date Taking? Authorizing Provider  amLODipine (NORVASC) 5 MG tablet Take 5 mg by mouth every evening.  05/13/20   [provider]  Ascorbic Acid (VITAMIN C CR) 500 MG TBCR Take 500 mg by mouth daily after lunch.     [provider]  atorvastatin (LIPITOR) 20 MG tablet Take 20 mg by mouth daily after lunch.  04/12/20   [provider]  B Complex-C (B-COMPLEX WITH VITAMIN C) tablet Take 1 tablet by mouth daily after supper.    [provider]  cloNIDine (CATAPRES) 0.1 MG tablet Take 1 tablet (0.1 mg total) by mouth daily. Take 0.1 mg at night. May take additional 0.1 mg at night,>180 Patient taking differently: Take 0.1 mg by mouth every evening.  06/07/20   Patwardhan, Reynold Bowen, MD  Continuous Blood Gluc Sensor (FREESTYLE LIBRE 2 SENSOR) MISC Inject into the skin every 14 (fourteen) days. 05/08/20   [provider]  ferrous sulfate 325 (65 FE) MG tablet Take 650 mg by mouth daily after supper.     [provider]  furosemide (LASIX) 20 MG tablet Take 1 tablet (20 mg total) by mouth daily. Patient taking differently: Take 20 mg by mouth daily after lunch.  07/21/20 10/19/20  Patwardhan, Reynold Bowen, MD  HUMALOG KWIKPEN 100 UNIT/ML KwikPen Inject 10-15 Units into the skin 3 (three) times daily before meals. Sliding Scale Insulin 05/09/20   [provider]  hydrALAZINE (APRESOLINE)  25 MG tablet Take 1 tablet (25 mg total) by mouth 3 (three) times daily. 07/21/20 10/19/20  Nigel Mormon, MD  levothyroxine (SYNTHROID) 125 MCG tablet Take 125 mcg by mouth daily before breakfast.  04/12/20   [provider]  losartan (COZAAR) 100 MG tablet Take 100 mg by mouth daily. 04/12/20   [provider]  magnesium oxide (MAG-OX) 400 MG tablet Take 400 mg by mouth daily after lunch.    [provider]  metoprolol succinate (TOPROL-XL) 50 MG 24 hr tablet Take 25 mg by mouth daily.  05/29/20   [provider]  Multiple Vitamin (MULTIVITAMIN WITH MINERALS) TABS tablet Take 1 tablet by mouth daily after lunch.    [provider]  Multiple  Vitamins-Minerals (EQ VISION FORMULA 50+ PO) Take 1 tablet by mouth every evening.     [provider]  Omega-3 1000 MG CAPS Take 2,000 mg by mouth at bedtime.     [provider]  TOUJEO SOLOSTAR 300 UNIT/ML Solostar Pen Inject 30 Units into the skin daily. 30 05/08/20   [provider]  vitamin B-12 (CYANOCOBALAMIN) 1000 MCG tablet Take 1,000 mcg by mouth daily.    [provider]    Allergies    Patient has no known allergies.  Review of Systems   Review of Systems  Constitutional:  Negative for fever.  HENT:  Negative for ear pain and sore throat.   Eyes:  Negative for pain.  Respiratory:  Negative for cough.   Cardiovascular:  Negative for chest pain.  Gastrointestinal:  Positive for abdominal pain.  Genitourinary:  Negative for flank pain.  Musculoskeletal:  Negative for back pain.  Skin:  Negative for color change and rash.  Neurological:  Negative for syncope.  All other systems reviewed and are negative.  Physical Exam Updated Vital Signs BP (!) 186/111   Pulse 83   Temp 98.9 F (37.2 C) (Oral)   Resp (!) 22   Ht 5\' 7"  (1.702 m)   Wt 73 kg   SpO2 100%   BMI 25.22 kg/m   Physical Exam Constitutional:      Appearance: He is well-developed.  HENT:     Head: Normocephalic.     Nose: Nose normal.  Eyes:     Extraocular Movements: Extraocular movements intact.  Cardiovascular:     Rate and Rhythm: Normal rate.  Pulmonary:     Effort: Pulmonary effort is normal.  Abdominal:     Tenderness: There is no abdominal tenderness. There is no right CVA tenderness, left CVA tenderness, guarding or rebound.  Skin:    Coloration: Skin is not jaundiced.  Neurological:     General: No focal deficit present.     Mental Status: He is alert and oriented to person, place, and time. Mental status is at baseline.     Cranial Nerves: No cranial nerve deficit.     Motor: No weakness.    ED Results / Procedures / Treatments   Labs (all labs  ordered are listed, but only abnormal results are displayed) Labs Reviewed  COMPREHENSIVE METABOLIC PANEL - Abnormal; Notable for the following components:      Result Value   Sodium 123 (*)    Chloride 91 (*)    CO2 21 (*)    Glucose, Bld 221 (*)    Creatinine, Ser 1.73 (*)    Albumin 3.4 (*)    GFR, Estimated 44 (*)    All other components within normal limits  CBC WITH DIFFERENTIAL/PLATELET - Abnormal; Notable for the following components:   RBC 3.69 (*)    Hemoglobin 11.3 (*)    HCT 32.4 (*)    All other components within normal limits  URINALYSIS, ROUTINE W REFLEX MICROSCOPIC - Abnormal; Notable for the following components:   Glucose, UA 150 (*)    Ketones, ur 5 (*)    Protein, ur >=300 (*)    All other components within normal limits  BRAIN NATRIURETIC PEPTIDE - Abnormal; Notable for the following components:   B Natriuretic Peptide 429.3 (*)    All other components within normal limits  CBG MONITORING, ED - Abnormal; Notable for the following components:   Glucose-Capillary 185 (*)    All other components within normal limits  RESP PANEL BY RT-PCR (FLU A&B, COVID) ARPGX2  CULTURE, BLOOD (ROUTINE X 2)  CULTURE, BLOOD (ROUTINE X 2)  URINE CULTURE  ETHANOL  LACTIC ACID, PLASMA    EKG EKG Interpretation  Date/Time:  Wednesday June 28 2021 17:24:12 EDT Ventricular Rate:  84 PR Interval:    QRS Duration: 92 QT Interval:  373 QTC Calculation: 441 R Axis:   65 Text Interpretation: Sinus rhythm Confirmed by Godfrey Pick ([id number]) on 06/28/2021 5:35:29 PM  Radiology No results found.  Procedures Procedures   Medications Ordered in ED Medications  ondansetron (ZOFRAN-ODT) disintegrating tablet 8 mg (8 mg Oral Given 06/28/21 1730)  labetalol (NORMODYNE) injection 10 mg (10 mg Intravenous Given 06/28/21 1849)    ED Course  I have reviewed the triage vital signs and the nursing notes.  Pertinent labs & imaging results that were available during my care of the  patient were reviewed by me and considered in my medical decision making (see chart for details).    MDM Rules/Calculators/A&P                           Labs show sodium of 123, creatinine of 1.7.  Unable to find any recent baseline lab levels.  Blood pressure appears improved with labetalol given here.  Given his symptoms of shakiness and hyponatremia, patient to be admitted to the hospitalist team.  Patient otherwise has no focal weakness noted.  Final Clinical Impression(s) / ED Diagnoses Final diagnoses:  Hyponatremia  Hypertension, unspecified type    Rx / DC Orders ED Discharge Orders     None        Luna Fuse, MD 06/28/21 1940    Luna Fuse, MD 06/28/21 [phone]

## 2021-06-28 NOTE — H&P (Signed)
History and Physical    Tim Spencer UQJ:[id number] DOB: 12/19/1960 DOA: 06/28/2021  PCP: [name], MD   Patient coming from: Home   Chief Complaint: Nausea, general weakness, malaise   HPI: Tim Spencer is a 60 y.o. male with medical history significant for hypertension, hypothyroidism, insulin-dependent diabetes mellitus, and CKD III A, now presenting to the emergency department with general weakness/malaise and nausea.  Patient reports that he had some leg swelling recently for which she was started on Lasix approximately 3 weeks ago.  His leg swelling resolved but then over the past 3 days or so he developed general weakness, malaise, loss of appetite, and nausea which have all been worsening.  He has also had insomnia for the past 3 nights.  He had 2 episodes of vomiting yesterday and has not eaten much at all.  He denies any chest pain, shortness of breath, cough, burning with urination, abdominal pain, or flank pain.  ED Course: Upon arrival to the ED, patient is found to be afebrile, saturating well on room air, and blood pressure as high as 200/90.  EKG features a sinus rhythm.  Chemistry panel notable for glucose 221, creatinine 1.73, and sodium 123.  CBC with mild normocytic anemia.  Lactic acid is normal and BNP elevated 429.  Urinalysis with proteinuria, ketonuria, and glucose urea.  Urine and blood cultures were collected in the emergency department and the patient was treated with IV labetalol and Zofran.  Review of Systems:  All other systems reviewed and apart from HPI, are negative.  Past Medical History:  Diagnosis Date   Anemia    CKD (chronic kidney disease) stage 3, GFR 30-59 ml/min (HCC)    Hypertension    Idiopathic progressive neuropathy    Secondary hyperparathyroidism, renal (HCC)    Type 2 diabetes mellitus ([place])     Past Surgical History:  Procedure Laterality Date   GIVENS CAPSULE STUDY N/A 09/01/2020   Procedure: GIVENS CAPSULE STUDY;   Surgeon: Juanita Craver, MD;  Location: [facility] ENDOSCOPY;  Service: Endoscopy;  Laterality: N/A;    Social History:   reports that he has never smoked. He has never used smokeless tobacco. He reports that he does not drink alcohol and does not use drugs.  No Known Allergies  Family History  Problem Relation Age of Onset   Stroke Mother    Hypertension Mother    Diabetes Father      Prior to Admission medications   Medication Sig Start Date End Date Taking? Authorizing Provider  amLODipine (NORVASC) 5 MG tablet Take 5 mg by mouth every evening.  05/13/20   [provider]  Ascorbic Acid (VITAMIN C CR) 500 MG TBCR Take 500 mg by mouth daily after lunch.     [provider]  atorvastatin (LIPITOR) 20 MG tablet Take 20 mg by mouth daily after lunch.  04/12/20   [provider]  B Complex-C (B-COMPLEX WITH VITAMIN C) tablet Take 1 tablet by mouth daily after supper.    [provider]  cloNIDine (CATAPRES) 0.1 MG tablet Take 1 tablet (0.1 mg total) by mouth daily. Take 0.1 mg at night. May take additional 0.1 mg at night,>180 Patient taking differently: Take 0.1 mg by mouth every evening.  06/07/20   Patwardhan, Reynold Bowen, MD  Continuous Blood Gluc Sensor (FREESTYLE LIBRE 2 SENSOR) MISC Inject into the skin every 14 (fourteen) days. 05/08/20   [provider]  ferrous sulfate 325 (65 FE) MG tablet Take 650 mg by mouth  daily after supper.     [provider]  furosemide (LASIX) 20 MG tablet Take 1 tablet (20 mg total) by mouth daily. Patient taking differently: Take 20 mg by mouth daily after lunch.  07/21/20 10/19/20  Patwardhan, Reynold Bowen, MD  HUMALOG KWIKPEN 100 UNIT/ML KwikPen Inject 10-15 Units into the skin 3 (three) times daily before meals. Sliding Scale Insulin 05/09/20   [provider]  hydrALAZINE (APRESOLINE) 25 MG tablet Take 1 tablet (25 mg total) by mouth 3 (three) times daily. 07/21/20 10/19/20  Nigel Mormon, MD   levothyroxine (SYNTHROID) 125 MCG tablet Take 125 mcg by mouth daily before breakfast.  04/12/20   [provider]  losartan (COZAAR) 100 MG tablet Take 100 mg by mouth daily. 04/12/20   [provider]  magnesium oxide (MAG-OX) 400 MG tablet Take 400 mg by mouth daily after lunch.    [provider]  metoprolol succinate (TOPROL-XL) 50 MG 24 hr tablet Take 25 mg by mouth daily.  05/29/20   [provider]  Multiple Vitamin (MULTIVITAMIN WITH MINERALS) TABS tablet Take 1 tablet by mouth daily after lunch.    [provider]  Multiple Vitamins-Minerals (EQ VISION FORMULA 50+ PO) Take 1 tablet by mouth every evening.     [provider]  Omega-3 1000 MG CAPS Take 2,000 mg by mouth at bedtime.     [provider]  TOUJEO SOLOSTAR 300 UNIT/ML Solostar Pen Inject 30 Units into the skin daily. 30 05/08/20   [provider]  vitamin B-12 (CYANOCOBALAMIN) 1000 MCG tablet Take 1,000 mcg by mouth daily.    [provider]    Physical Exam: Vitals:   06/28/21 1638 06/28/21 1800 06/28/21 1848 06/28/21 1900  BP: (!) 199/92 (!) 199/85 (!) 189/81 (!) 186/111  Pulse: 82 81 86 83  Resp: 20 19 (!) 27 (!) 22  Temp: 98.5 F (36.9 C) 98.9 F (37.2 C)    TempSrc: Oral Oral    SpO2: 99% 100% 100% 100%  Weight:      Height:        Constitutional: NAD, calm  Eyes: PERTLA, lids and conjunctivae normal ENMT: Mucous membranes are moist. Posterior pharynx clear of any exudate or lesions.   Neck: normal, supple, no masses, no thyromegaly Respiratory: no wheezing, no crackles. No accessory muscle use.  Cardiovascular: S1 & S2 heard, regular rate and rhythm. No extremity edema.   Abdomen: No distension, no tenderness, soft. Bowel sounds active.  Musculoskeletal: no clubbing / cyanosis. No joint deformity upper and lower extremities.   Skin: no significant rashes, lesions, ulcers. Warm, dry, well-perfused. Neurologic: CN 2-12 grossly  intact. Moving all extremities. Alert and oriented.  Psychiatric: Anxious. Cooperative.    Labs and Imaging on Admission: I have personally reviewed following labs and imaging studies  CBC: Recent Labs  Lab 06/28/21 1727  WBC 9.7  NEUTROABS 7.1  HGB 11.3*  HCT 32.4*  MCV 87.8  PLT 967   Basic Metabolic Panel: Recent Labs  Lab 06/28/21 1727  NA 123*  K 4.2  CL 91*  CO2 21*  GLUCOSE 221*  BUN 21  CREATININE 1.73*  CALCIUM 10.0   GFR: Estimated Creatinine Clearance: 41.9 mL/min (A) (by C-G formula based on SCr of 1.73 mg/dL (H)). Liver Function Tests: Recent Labs  Lab 06/28/21 1727  AST 40  ALT 28  ALKPHOS 55  BILITOT 0.9  PROT 6.6  ALBUMIN 3.4*   No results for input(s): LIPASE, AMYLASE in the  last 168 hours. No results for input(s): AMMONIA in the last 168 hours. Coagulation Profile: No results for input(s): INR, PROTIME in the last 168 hours. Cardiac Enzymes: No results for input(s): CKTOTAL, CKMB, CKMBINDEX, TROPONINI in the last 168 hours. BNP (last 3 results) No results for input(s): PROBNP in the last 8760 hours. HbA1C: No results for input(s): HGBA1C in the last 72 hours. CBG: Recent Labs  Lab 06/28/21 1715  GLUCAP 185*   Lipid Profile: No results for input(s): CHOL, HDL, LDLCALC, TRIG, CHOLHDL, LDLDIRECT in the last 72 hours. Thyroid Function Tests: No results for input(s): TSH, T4TOTAL, FREET4, T3FREE, THYROIDAB in the last 72 hours. Anemia Panel: No results for input(s): VITAMINB12, FOLATE, FERRITIN, TIBC, IRON, RETICCTPCT in the last 72 hours. Urine analysis:    Component Value Date/Time   COLORURINE YELLOW 06/28/2021 1721   APPEARANCEUR CLEAR 06/28/2021 1721   LABSPEC 1.009 06/28/2021 1721   PHURINE 7.0 06/28/2021 1721   GLUCOSEU 150 (A) 06/28/2021 1721   HGBUR NEGATIVE 06/28/2021 1721   BILIRUBINUR NEGATIVE 06/28/2021 1721   KETONESUR 5 (A) 06/28/2021 1721   PROTEINUR >=300 (A) 06/28/2021 1721   NITRITE NEGATIVE 06/28/2021 1721    LEUKOCYTESUR NEGATIVE 06/28/2021 1721   Sepsis Labs: @LABRCNTIP (procalcitonin:4,lacticidven:4) ) Recent Results (from the past 240 hour(s))  Resp Panel by RT-PCR (Flu A&B, Covid)     Status: None   Collection Time: 06/28/21  5:20 PM   Specimen: Nasopharyngeal(NP) swabs in vial transport medium  Result Value Ref Range Status   SARS Coronavirus 2 by RT PCR NEGATIVE NEGATIVE Final    Comment: (NOTE) SARS-CoV-2 target nucleic acids are NOT DETECTED.  The SARS-CoV-2 RNA is generally detectable in upper respiratory specimens during the acute phase of infection. The lowest concentration of SARS-CoV-2 viral copies this assay can detect is 138 copies/mL. A negative result does not preclude SARS-Cov-2 infection and should not be used as the sole basis for treatment or other patient management decisions. A negative result may occur with  improper specimen collection/handling, submission of specimen other than nasopharyngeal swab, presence of viral mutation(s) within the areas targeted by this assay, and inadequate number of viral copies(<138 copies/mL). A negative result must be combined with clinical observations, patient history, and epidemiological information. The expected result is Negative.  Fact Sheet for Patients:  EntrepreneurPulse.com.au  Fact Sheet for Healthcare Providers:  IncredibleEmployment.be  This test is no t yet approved or cleared by the [place] FDA and  has been authorized for detection and/or diagnosis of SARS-CoV-2 by FDA under an Emergency Use Authorization (EUA). This EUA will remain  in effect (meaning this test can be used) for the duration of the COVID-19 declaration under Section 564(b)(1) of the Act, 21 U.S.C.section 360bbb-3(b)(1), unless the authorization is terminated  or revoked sooner.       Influenza A by PCR NEGATIVE NEGATIVE Final   Influenza B by PCR NEGATIVE NEGATIVE Final    Comment: (NOTE) The  Xpert Xpress SARS-CoV-2/FLU/RSV plus assay is intended as an aid in the diagnosis of influenza from Nasopharyngeal swab specimens and should not be used as a sole basis for treatment. Nasal washings and aspirates are unacceptable for Xpert Xpress SARS-CoV-2/FLU/RSV testing.  Fact Sheet for Patients: EntrepreneurPulse.com.au  Fact Sheet for Healthcare Providers: IncredibleEmployment.be  This test is not yet approved or cleared by the [place] FDA and has been authorized for detection and/or diagnosis of SARS-CoV-2 by FDA under an Emergency Use Authorization (EUA). This EUA will remain in effect (meaning this test  can be used) for the duration of the COVID-19 declaration under Section 564(b)(1) of the Act, 21 U.S.C. section 360bbb-3(b)(1), unless the authorization is terminated or revoked.  Performed at [facility], [place] [address] [place], [place] [address]      Radiological Exams on Admission: No results found.  EKG: Independently reviewed. Sinus rhythm.   Assessment/Plan   1. Hyponatremia  - Serum sodium is 123 on admission, corrected to 126 when accounting for hyperglycemia  - He appears hypovolemic in setting of recent course or Lasix, poor appetite, and N/V  - He has nausea and malaise but no severe symptoms  - Hold diuretic, hydrate with NS, and repeat labs in am   2. Hypertensive urgency  - BP as high as 199/92 in ED without signs or symptoms  - Continue Norvasc, clonidine, diltiazem, Toprol, and hydralazine    3. CKD IIIa  - SCr is 1.73 on admission, up from 1.51 in May 2021  - Renally-dose medications, hold ARB initially, monitor    4. Type II DM  - A1c was 6.9% a year ago  - Continue CBG checks and insulin    DVT prophylaxis: Lovenox  Code Status: Full  Level of Care: Level of care: Telemetry Family Communication: nephew updated at bedside   Disposition Plan:  Patient is from: Home   Anticipated d/c is to: Home  Anticipated d/c date is: Possible as early as 06/29/21 Patient currently: Pending repeat labs, BP control  Consults called: None  Admission status: Observation     Vianne Bulls, MD [facility]  06/28/2021, 7:49 PM

## 2021-06-28 NOTE — ED Provider Notes (Signed)
Emergency Medicine Provider Triage Evaluation Note  Jacorian Golaszewski , a 60 y.o. male  was evaluated in triage.  Pt complains of nausea/vomiting.  Patient states he was referred to a nephrologist about 3 weeks ago due to pedal edema and was started on Lasix.  States he has been taking that but stopped about 2 days ago due to his pedal edema improving.  States he is feeling shaky and having intermittent bouts of increased warmth as well as chills.  Reports intermittent nausea/vomiting.  Also reports urinary frequency.  Denies any chest pain, shortness of breath, dysuria.  Physical Exam  BP (!) 199/92 (BP Location: Right Arm)   Pulse 82   Temp 98.5 F (36.9 C) (Oral)   Resp 20   Ht 5\' 7"  (1.702 m)   Wt 73 kg   SpO2 99%   BMI 25.22 kg/m  Gen:   Awake, no distress   Resp:  Normal effort  MSK:   Moves extremities without difficulty  Other:    Medical Decision Making  Medically screening exam initiated at 4:44 PM.  Appropriate orders placed.  Tryson Glassburn was informed that the remainder of the evaluation will be completed by another provider, this initial triage assessment does not replace that evaluation, and the importance of remaining in the ED until their evaluation is complete.   Rayna Sexton, PA-C 06/28/21 1645    Luna Fuse, MD 06/28/21 [phone]

## 2021-06-28 NOTE — ED Triage Notes (Addendum)
Pt bib [facility] from home d/t frequent urination, N/V.  Pt has been on lasix approximately 3 weeks.  Pt has not taken lasix x 2 days and reported to EMS no decrease in urinary frequency.  Pt told EMS he has hx of hyponatremia that felt similar. Pt reports to this writer feeling uneasy.  Denies pain currently.

## 2021-06-29 DIAGNOSIS — N1831 Chronic kidney disease, stage 3a: Secondary | ICD-10-CM | POA: Diagnosis present

## 2021-06-29 DIAGNOSIS — Z23 Encounter for immunization: Secondary | ICD-10-CM | POA: Diagnosis not present

## 2021-06-29 DIAGNOSIS — Z79890 Hormone replacement therapy (postmenopausal): Secondary | ICD-10-CM | POA: Diagnosis not present

## 2021-06-29 DIAGNOSIS — E039 Hypothyroidism, unspecified: Secondary | ICD-10-CM | POA: Diagnosis present

## 2021-06-29 DIAGNOSIS — Z794 Long term (current) use of insulin: Secondary | ICD-10-CM | POA: Diagnosis not present

## 2021-06-29 DIAGNOSIS — Z79899 Other long term (current) drug therapy: Secondary | ICD-10-CM | POA: Diagnosis not present

## 2021-06-29 DIAGNOSIS — Z833 Family history of diabetes mellitus: Secondary | ICD-10-CM | POA: Diagnosis not present

## 2021-06-29 DIAGNOSIS — I129 Hypertensive chronic kidney disease with stage 1 through stage 4 chronic kidney disease, or unspecified chronic kidney disease: Secondary | ICD-10-CM | POA: Diagnosis present

## 2021-06-29 DIAGNOSIS — G4700 Insomnia, unspecified: Secondary | ICD-10-CM | POA: Diagnosis present

## 2021-06-29 DIAGNOSIS — E871 Hypo-osmolality and hyponatremia: Secondary | ICD-10-CM | POA: Diagnosis present

## 2021-06-29 DIAGNOSIS — R350 Frequency of micturition: Secondary | ICD-10-CM | POA: Diagnosis present

## 2021-06-29 DIAGNOSIS — Z20822 Contact with and (suspected) exposure to covid-19: Secondary | ICD-10-CM | POA: Diagnosis present

## 2021-06-29 DIAGNOSIS — Z8249 Family history of ischemic heart disease and other diseases of the circulatory system: Secondary | ICD-10-CM | POA: Diagnosis not present

## 2021-06-29 DIAGNOSIS — E861 Hypovolemia: Secondary | ICD-10-CM | POA: Diagnosis present

## 2021-06-29 DIAGNOSIS — G603 Idiopathic progressive neuropathy: Secondary | ICD-10-CM | POA: Diagnosis present

## 2021-06-29 DIAGNOSIS — E1122 Type 2 diabetes mellitus with diabetic chronic kidney disease: Secondary | ICD-10-CM | POA: Diagnosis present

## 2021-06-29 DIAGNOSIS — E1165 Type 2 diabetes mellitus with hyperglycemia: Secondary | ICD-10-CM | POA: Diagnosis present

## 2021-06-29 DIAGNOSIS — D649 Anemia, unspecified: Secondary | ICD-10-CM | POA: Diagnosis present

## 2021-06-29 DIAGNOSIS — N2581 Secondary hyperparathyroidism of renal origin: Secondary | ICD-10-CM | POA: Diagnosis present

## 2021-06-29 DIAGNOSIS — I160 Hypertensive urgency: Secondary | ICD-10-CM | POA: Diagnosis present

## 2021-06-29 LAB — BASIC METABOLIC PANEL
Anion gap: 5 (ref 5–15)
BUN: 17 mg/dL (ref 8–23)
CO2: 26 mmol/L (ref 22–32)
Calcium: 9.5 mg/dL (ref 8.9–10.3)
Chloride: 97 mmol/L — ABNORMAL LOW (ref 98–111)
Creatinine, Ser: 1.82 mg/dL — ABNORMAL HIGH (ref 0.61–1.24)
GFR, Estimated: 42 mL/min — ABNORMAL LOW (ref >60)
Glucose, Bld: 200 mg/dL — ABNORMAL HIGH (ref 70–99)
Potassium: 3.9 mmol/L (ref 3.5–5.1)
Sodium: 128 mmol/L — ABNORMAL LOW (ref 135–145)

## 2021-06-29 LAB — HEMOGLOBIN A1C
Hgb A1c MFr Bld: 6.9 % — ABNORMAL HIGH (ref 4.8–5.6)
Mean Plasma Glucose: 151.33 mg/dL

## 2021-06-29 LAB — CBC
HCT: 27.3 % — ABNORMAL LOW (ref 39.0–52.0)
Hemoglobin: 9.6 g/dL — ABNORMAL LOW (ref 13.0–17.0)
MCH: 30.6 pg (ref 26.0–34.0)
MCHC: 35.2 g/dL (ref 30.0–36.0)
MCV: 86.9 fL (ref 80.0–100.0)
Platelets: 161 10*3/uL (ref 150–400)
RBC: 3.14 MIL/uL — ABNORMAL LOW (ref 4.22–5.81)
RDW: 11.9 % (ref 11.5–15.5)
WBC: 8.3 10*3/uL (ref 4.0–10.5)
nRBC: 0 % (ref 0.0–0.2)

## 2021-06-29 LAB — GLUCOSE, CAPILLARY
Glucose-Capillary: 273 mg/dL — ABNORMAL HIGH (ref 70–99)
Glucose-Capillary: 260 mg/dL — ABNORMAL HIGH (ref 70–99)
Glucose-Capillary: 259 mg/dL — ABNORMAL HIGH (ref 70–99)

## 2021-06-29 LAB — BRAIN NATRIURETIC PEPTIDE: B Natriuretic Peptide: 530.5 pg/mL — ABNORMAL HIGH (ref 0.0–100.0)

## 2021-06-29 LAB — CBG MONITORING, ED: Glucose-Capillary: 241 mg/dL — ABNORMAL HIGH (ref 70–99)

## 2021-06-29 LAB — OSMOLALITY, URINE: Osmolality, Ur: 225 mOsm/kg — ABNORMAL LOW (ref 300–900)

## 2021-06-29 LAB — SODIUM, URINE, RANDOM
Sodium, Ur: 17 mmol/L
Sodium, Ur: 18 mmol/L

## 2021-06-29 LAB — SODIUM: Sodium: 128 mmol/L — ABNORMAL LOW (ref 135–145)

## 2021-06-29 MED ORDER — INSULIN GLARGINE-YFGN 100 UNIT/ML ~~LOC~~ SOLN
25.0000 [IU] | Freq: Every day | SUBCUTANEOUS | Status: DC
  Administered 2021-06-30 – 2021-07-01 (×2): 25 [IU] via SUBCUTANEOUS
  Filled 2021-06-29 (×2): qty 0.25

## 2021-06-29 MED ORDER — INSULIN ASPART 100 UNIT/ML IJ SOLN: 5.0000 [IU] | Freq: Three times a day (TID) | INTRAMUSCULAR | Status: DC

## 2021-06-29 MED ORDER — HYDRALAZINE HCL 50 MG PO TABS
50.0000 mg | ORAL_TABLET | Freq: Three times a day (TID) | ORAL | Status: DC
  Administered 2021-06-29 – 2021-06-30 (×2): 50 mg via ORAL
  Filled 2021-06-29 (×3): qty 1

## 2021-06-29 MED ORDER — CLONIDINE HCL 0.1 MG PO TABS
0.2000 mg | ORAL_TABLET | Freq: Every evening | ORAL | Status: DC
  Administered 2021-06-30: 0.2 mg via ORAL
  Filled 2021-06-29: qty 2

## 2021-06-29 MED ORDER — HYDRALAZINE HCL 25 MG PO TABS
25.0000 mg | ORAL_TABLET | Freq: Four times a day (QID) | ORAL | Status: DC | PRN
  Administered 2021-06-29 – 2021-06-30 (×3): 25 mg via ORAL
  Filled 2021-06-29 (×2): qty 1

## 2021-06-29 MED ORDER — CARVEDILOL 12.5 MG PO TABS
12.5000 mg | ORAL_TABLET | Freq: Two times a day (BID) | ORAL | Status: DC
  Administered 2021-06-29 – 2021-06-30 (×2): 12.5 mg via ORAL
  Filled 2021-06-29 (×2): qty 1

## 2021-06-29 MED ORDER — SODIUM CHLORIDE 0.9 % IV SOLN: INTRAVENOUS | Status: DC

## 2021-06-29 MED ORDER — CLONIDINE HCL 0.1 MG PO TABS
0.1000 mg | ORAL_TABLET | Freq: Every evening | ORAL | Status: DC | PRN
  Administered 2021-06-30: 0.1 mg via ORAL
  Filled 2021-06-29: qty 1

## 2021-06-29 NOTE — Progress Notes (Addendum)
PROGRESS NOTE    Tim Spencer   IFO:[id number]  DOB: [birth year]  PCP: Aretta Nip, MD    DOA: 06/28/2021 LOS: 0    Brief Narrative / Hospital Course to Date:   60 year old male with past medical history of hypertension, hypothyroidism, insulin-dependent type 2 diabetes and CKD stage IIIa presented to the [facility] ED on 06/28/2021 with progressive generalized weakness/malaise, loss of appetite and nausea with 2 episodes of nonbloody nonbilious emesis the day prior to presenting.  He had been prescribed course of Lasix for lower extremity swelling proximately 3 weeks ago.  Evaluation in the ED revealed uncontrolled blood pressure 200/90, hyperglycemia glucose 221, creatinine 1.73 and hyponatremia with sodium 123.  UA showed proteinuria, glucosuria and ketonuria.  Admitted to [facility] service for further evaluation management of hyponatremia and hypertensive urgency.  Assessment & Plan   Principal Problem:   Hyponatremia Active Problems:   Hypertensive urgency   Chronic kidney disease, stage 3a ([place])   Insulin-requiring or dependent type II diabetes mellitus (HCC)   Hypothyroidism   Hyponatremia -presented with sodium 123, corrected to 126 in setting of hyperglycemia.  Suspect hypovolemic in the setting of 2 weeks of Lasix, poor appetite with nausea and vomiting.  Patient was prescribed 2 weeks Lasix for lower extremity edema. -- Diuretics on hold --Continue IV hydration --Repeat sodium level this afternoon --BMP in the morning --Check AM cortisol, TSH, serum and urine osmolality, urine sodium for further evaluation.  Hypertensive urgency -BP as high as 199/92 in the ED.  Patient asymptomatic.  Recent changes to antihypertensives made in the outpatient setting.  He and wife report current medications include hydralazine, olmesartan, metoprolol, amlodipine had been changed to diltiazem, clonidine. 10/6: BP remains poorly controlled today. --Amlodipine changed to diltiazem as  outpatient. --DC amlodipine --Continue diltiazem, clonidine, hydralazine (increased to 50) --Continue losartan substituted for home olmesartan --Change metoprolol to Coreg for better BP effect --As needed oral hydralazine if systolic BP above 676  CKD stage IIIa -presented with creatinine 1.73, up slightly from 1.51 in May 2021.  Suspect this is near his baseline.  On IV hydration for hyponatremia as above. --Monitor BMP  Type 2 diabetes with hyperglycemia -A1c a year ago 6.9%. Continue basal insulin and sliding scale NovoLog. Home regimen appears to be Toujeo 30 units daily, Humalog 10 to 15 units 3 times daily before meals. -- Increase glargine to 25 units daily --Add mealtime NovoLog 5 units in addition to sliding scale   Patient BMI: Body mass index is 25.22 kg/m.   DVT prophylaxis: enoxaparin (LOVENOX) injection 40 mg Start: 06/28/21 2200   Diet:  Diet Orders (From admission, onward)     Start     Ordered   06/29/21 0944  Diet Carb Modified Fluid consistency: Thin; Room service appropriate? Yes; Fluid restriction: 1500 mL Fluid  Diet effective now       Comments: Vegetarian  Question Answer Comment  Diet-HS Snack? Nothing   Calorie Level Medium 1600-2000   Fluid consistency: Thin   Room service appropriate? Yes   Fluid restriction: 1500 mL Fluid      06/29/21 0943              Code Status: Full Code   Subjective 06/29/21    Patient seen with wife at bedside on rounds today.  He reports feeling better but still feels somewhat weak due to lack of sleep past few days.  The shaking and nausea have improved.  Expresses concern over elevated BP.  Denies chest pain, headache or vision changes, shortness of breath.  No other acute complaints.  Hopes to go home tomorrow.   Disposition Plan & Communication   Status is: Inpatient  Remains inpatient appropriate because:IV treatments appropriate due to intensity of illness or inability to take PO  Dispo: The patient is  from: Home              Anticipated d/c is to: Home              Patient currently is not medically stable to d/c.   Difficult to place patient No   Family Communication: Wife at bedside on rounds today 10/6.   Consults, Procedures, Significant Events   Consultants:  none  Procedures:  none  Antimicrobials:  Anti-infectives (From admission, onward)    None         Micro    Objective   Vitals:   06/29/21 1132 06/29/21 1307 06/29/21 1439 06/29/21 1935  BP: (!) 188/77 (!) 176/74 (!) 155/71 (!) 182/77  Pulse: 81 78 72 72  Resp:  19 15 18   Temp:  99.1 F (37.3 C) 98.7 F (37.1 C) (!) 97.5 F (36.4 C)  TempSrc:  Oral Oral Oral  SpO2:  99% 100% 100%  Weight:      Height:       No intake or output data in the 24 hours ending 06/29/21 2020 Filed Weights   06/28/21 1634  Weight: 73 kg    Physical Exam:  General exam: awake, alert, no acute distress HEENT: atraumatic, clear conjunctiva, anicteric sclera, moist mucus membranes, hearing grossly normal  Respiratory system: CTAB, no wheezes, rales or rhonchi, normal respiratory effort. Cardiovascular system: normal S1/S2,  RRR, no JVD, murmurs, rubs, gallops,  no pedal edema.   Gastrointestinal system: soft, NT, ND, no HSM felt, +bowel sounds. Central nervous system: A&O x3. no gross focal neurologic deficits, normal speech Extremities: moves all , no edema, normal tone Psychiatry: normal mood, congruent affect, judgement and insight appear normal  Labs   Data Reviewed: I have personally reviewed following labs and imaging studies  CBC: Recent Labs  Lab 06/28/21 1727 06/29/21 0443  WBC 9.7 8.3  NEUTROABS 7.1  --   HGB 11.3* 9.6*  HCT 32.4* 27.3*  MCV 87.8 86.9  PLT 233 790   Basic Metabolic Panel: Recent Labs  Lab 06/28/21 1727 06/29/21 0443 06/29/21 1521  NA 123* 128* 128*  K 4.2 3.9  --   CL 91* 97*  --   CO2 21* 26  --   GLUCOSE 221* 200*  --   BUN 21 17  --   CREATININE 1.73* 1.82*  --    CALCIUM 10.0 9.5  --    GFR: Estimated Creatinine Clearance: 39.8 mL/min (A) (by C-G formula based on SCr of 1.82 mg/dL (H)). Liver Function Tests: Recent Labs  Lab 06/28/21 1727  AST 40  ALT 28  ALKPHOS 55  BILITOT 0.9  PROT 6.6  ALBUMIN 3.4*   No results for input(s): LIPASE, AMYLASE in the last 168 hours. No results for input(s): AMMONIA in the last 168 hours. Coagulation Profile: No results for input(s): INR, PROTIME in the last 168 hours. Cardiac Enzymes: No results for input(s): CKTOTAL, CKMB, CKMBINDEX, TROPONINI in the last 168 hours. BNP (last 3 results) No results for input(s): PROBNP in the last 8760 hours. HbA1C: Recent Labs    06/29/21 0443  HGBA1C 6.9*   CBG: Recent Labs  Lab 06/28/21 1715 06/28/21 2200  06/29/21 0825 06/29/21 1215 06/29/21 1712  GLUCAP 185* 299* 241* 273* 260*   Lipid Profile: No results for input(s): CHOL, HDL, LDLCALC, TRIG, CHOLHDL, LDLDIRECT in the last 72 hours. Thyroid Function Tests: No results for input(s): TSH, T4TOTAL, FREET4, T3FREE, THYROIDAB in the last 72 hours. Anemia Panel: No results for input(s): VITAMINB12, FOLATE, FERRITIN, TIBC, IRON, RETICCTPCT in the last 72 hours. Sepsis Labs: Recent Labs  Lab 06/28/21 1805  LATICACIDVEN 1.2    Recent Results (from the past 240 hour(s))  Resp Panel by RT-PCR (Flu A&B, Covid)     Status: None   Collection Time: 06/28/21  5:20 PM   Specimen: Nasopharyngeal(NP) swabs in vial transport medium  Result Value Ref Range Status   SARS Coronavirus 2 by RT PCR NEGATIVE NEGATIVE Final    Comment: (NOTE) SARS-CoV-2 target nucleic acids are NOT DETECTED.  The SARS-CoV-2 RNA is generally detectable in upper respiratory specimens during the acute phase of infection. The lowest concentration of SARS-CoV-2 viral copies this assay can detect is 138 copies/mL. A negative result does not preclude SARS-Cov-2 infection and should not be used as the sole basis for treatment or other  patient management decisions. A negative result may occur with  improper specimen collection/handling, submission of specimen other than nasopharyngeal swab, presence of viral mutation(s) within the areas targeted by this assay, and inadequate number of viral copies(<138 copies/mL). A negative result must be combined with clinical observations, patient history, and epidemiological information. The expected result is Negative.  Fact Sheet for Patients:  EntrepreneurPulse.com.au  Fact Sheet for Healthcare Providers:  IncredibleEmployment.be  This test is no t yet approved or cleared by the [place] FDA and  has been authorized for detection and/or diagnosis of SARS-CoV-2 by FDA under an Emergency Use Authorization (EUA). This EUA will remain  in effect (meaning this test can be used) for the duration of the COVID-19 declaration under Section 564(b)(1) of the Act, 21 U.S.C.section 360bbb-3(b)(1), unless the authorization is terminated  or revoked sooner.       Influenza A by PCR NEGATIVE NEGATIVE Final   Influenza B by PCR NEGATIVE NEGATIVE Final    Comment: (NOTE) The Xpert Xpress SARS-CoV-2/FLU/RSV plus assay is intended as an aid in the diagnosis of influenza from Nasopharyngeal swab specimens and should not be used as a sole basis for treatment. Nasal washings and aspirates are unacceptable for Xpert Xpress SARS-CoV-2/FLU/RSV testing.  Fact Sheet for Patients: EntrepreneurPulse.com.au  Fact Sheet for Healthcare Providers: IncredibleEmployment.be  This test is not yet approved or cleared by the [place] FDA and has been authorized for detection and/or diagnosis of SARS-CoV-2 by FDA under an Emergency Use Authorization (EUA). This EUA will remain in effect (meaning this test can be used) for the duration of the COVID-19 declaration under Section 564(b)(1) of the Act, 21 U.S.C. section  360bbb-3(b)(1), unless the authorization is terminated or revoked.  Performed at [facility], [place] [address], [place], [place] [address]   Culture, blood (routine x 2)     Status: None (Preliminary result)   Collection Time: 06/28/21  6:05 PM   Specimen: BLOOD RIGHT WRIST  Result Value Ref Range Status   Specimen Description   Final    BLOOD RIGHT WRIST Performed at [place] [address], [place],  [address]    Special Requests   Final    BOTTLES DRAWN AEROBIC AND ANAEROBIC Blood Culture adequate volume Performed at [place] [address]  [place], [place] [address]    Culture   Final    NO GROWTH < 12 HOURS Performed at [place] [address], [place], [place] [address]    Report Status PENDING  Incomplete  Culture, blood (routine x 2)     Status: None (Preliminary result)   Collection Time: 06/28/21  6:10 PM   Specimen: BLOOD RIGHT HAND  Result Value Ref Range Status   Specimen Description   Final    BLOOD RIGHT HAND Performed at [place] [address], [place], [place] [address]    Special Requests   Final    BOTTLES DRAWN AEROBIC AND ANAEROBIC Blood Culture adequate volume Performed at [place] [address], [place], [place] [address]    Culture   Final    NO GROWTH < 12 HOURS Performed at [place] [address] [place],  [address]    Report Status PENDING  Incomplete      Imaging Studies   No results found.   Medications   Scheduled Meds:  atorvastatin  20 mg Oral QPC lunch   carvedilol  12.5 mg Oral BID WC   cloNIDine  0.1 mg Oral QPM   diltiazem  60 mg Oral Daily   enoxaparin (LOVENOX) injection  40 mg Subcutaneous Q24H   gabapentin  300 mg Oral TID   hydrALAZINE  25 mg Oral TID   insulin aspart  0-5 Units Subcutaneous QHS   insulin aspart  0-9 Units Subcutaneous TID WC   insulin  glargine-yfgn  15 Units Subcutaneous Daily   levothyroxine  125 mcg Oral Q0600   Continuous Infusions:  sodium chloride 100 mL/hr at 06/29/21 0949       LOS: 0 days    Time spent: 30 minutes    Ezekiel Slocumb, DO [facility]  06/29/2021, 8:20 PM      If 7PM-7AM, please contact night-coverage. How to contact the [facility] Attending or Consulting provider [place] or covering provider during after hours [place], for this patient?    Check the care team in [facility] and look for a) attending/consulting TRH provider listed and b) the [facility] team listed Log into [email or web address] and use [place]'s universal password to access. If you do not have the password, please contact the hospital operator. Locate the [facility] provider you are looking for under [facility] and page to a number that you can be directly reached. If you still have difficulty reaching the provider, please page the [facility] (Director on Call) for the Hospitalists listed on amion for assistance.

## 2021-06-29 NOTE — Plan of Care (Signed)

## 2021-06-29 NOTE — TOC Transition Note (Signed)
Transition of Care [facility]) - CM/SW Discharge Note   Patient Details  Name: Tim Spencer MRN: [id number] Date of Birth: [birth year]  Transition of [facility]) CM/SW Contact:  Trish Mage, LCSW Phone Number: 06/29/2021, 1:40 PM   Clinical Narrative:   Patient seen in follow up to RN request.  Mr Walls and wife were together in room.  He currently has no income and is on his wife's insurance.  They wanted to know about options for insurance and possible income.  I explained application process for disability, and gave them both website information as well as physical address and phone number.  No further needs identified. TOC will continue to follow during the course of hospitalization.     Final next level of care: Home/Self Care Barriers to Discharge: No Barriers Identified   Patient Goals and CMS Choice        Discharge Placement                       Discharge Plan and Services                                     Social Determinants of Health (SDOH) Interventions     Readmission Risk Interventions No flowsheet data found.

## 2021-06-29 NOTE — Progress Notes (Signed)
Chaplain engaged in an initial visit with Tim Spencer and Tim Spencer.  Chaplain provided education on Scientist, physiological, Healthcare POA.  Chaplain was able to answer questions that they had around the document.  Tim Spencer desires to assign Tim Spencer as Tim healthcare agent.  Chaplain also offered assurance that because they are married, there is already a legally binding agreement in place to honor Tim Spencer's ability to have decision making power.  Chaplain let them know how to reach her when they have completed that paperwork.    Chaplain will follow-up.    06/29/21 1300  Clinical Encounter Type  Visited With Patient and family together  Visit Type Initial;Social support  Referral From Patient  Consult/Referral To Chaplain

## 2021-06-30 DIAGNOSIS — E871 Hypo-osmolality and hyponatremia: Secondary | ICD-10-CM | POA: Diagnosis not present

## 2021-06-30 LAB — GLUCOSE, CAPILLARY
Glucose-Capillary: 211 mg/dL — ABNORMAL HIGH (ref 70–99)
Glucose-Capillary: 183 mg/dL — ABNORMAL HIGH (ref 70–99)
Glucose-Capillary: 47 mg/dL — ABNORMAL LOW (ref 70–99)
Glucose-Capillary: 99 mg/dL (ref 70–99)
Glucose-Capillary: 262 mg/dL — ABNORMAL HIGH (ref 70–99)

## 2021-06-30 LAB — BASIC METABOLIC PANEL
Anion gap: 6 (ref 5–15)
BUN: 21 mg/dL (ref 8–23)
CO2: 23 mmol/L (ref 22–32)
Calcium: 9.4 mg/dL (ref 8.9–10.3)
Chloride: 101 mmol/L (ref 98–111)
Creatinine, Ser: 1.76 mg/dL — ABNORMAL HIGH (ref 0.61–1.24)
GFR, Estimated: 43 mL/min — ABNORMAL LOW (ref >60)
Glucose, Bld: 214 mg/dL — ABNORMAL HIGH (ref 70–99)
Potassium: 4.1 mmol/L (ref 3.5–5.1)
Sodium: 130 mmol/L — ABNORMAL LOW (ref 135–145)

## 2021-06-30 LAB — OSMOLALITY, URINE: Osmolality, Ur: 171 mOsm/kg — ABNORMAL LOW (ref 300–900)

## 2021-06-30 LAB — CBC
HCT: 30.4 % — ABNORMAL LOW (ref 39.0–52.0)
Hemoglobin: 10.6 g/dL — ABNORMAL LOW (ref 13.0–17.0)
MCH: 30.7 pg (ref 26.0–34.0)
MCHC: 34.9 g/dL (ref 30.0–36.0)
MCV: 88.1 fL (ref 80.0–100.0)
Platelets: 190 10*3/uL (ref 150–400)
RBC: 3.45 MIL/uL — ABNORMAL LOW (ref 4.22–5.81)
RDW: 12.4 % (ref 11.5–15.5)
WBC: 7 10*3/uL (ref 4.0–10.5)
nRBC: 0 % (ref 0.0–0.2)

## 2021-06-30 LAB — URINE CULTURE: Culture: <10000 — AB

## 2021-06-30 LAB — CORTISOL: Cortisol, Plasma: 7.9 ug/dL

## 2021-06-30 LAB — MAGNESIUM: Magnesium: 1.9 mg/dL (ref 1.7–2.4)

## 2021-06-30 LAB — OSMOLALITY: Osmolality: 285 mOsm/kg (ref 275–295)

## 2021-06-30 LAB — TSH: TSH: 4.407 u[IU]/mL (ref 0.350–4.500)

## 2021-06-30 LAB — HIV ANTIBODY (ROUTINE TESTING W REFLEX): HIV Screen 4th Generation wRfx: NONREACTIVE

## 2021-06-30 MED ORDER — CARVEDILOL 25 MG PO TABS
25.0000 mg | ORAL_TABLET | Freq: Two times a day (BID) | ORAL | Status: DC
  Administered 2021-06-30 – 2021-07-01 (×2): 25 mg via ORAL
  Filled 2021-06-30 (×2): qty 1

## 2021-06-30 MED ORDER — GLUCERNA SHAKE PO LIQD
237.0000 mL | Freq: Two times a day (BID) | ORAL | Status: DC
  Administered 2021-07-01 (×2): 237 mL via ORAL
  Filled 2021-06-30 (×2): qty 237

## 2021-06-30 MED ORDER — HYDRALAZINE HCL 50 MG PO TABS
100.0000 mg | ORAL_TABLET | Freq: Three times a day (TID) | ORAL | Status: DC
  Administered 2021-06-30 – 2021-07-01 (×3): 100 mg via ORAL
  Filled 2021-06-30 (×3): qty 2

## 2021-06-30 MED ORDER — INSULIN ASPART 100 UNIT/ML IJ SOLN
5.0000 [IU] | Freq: Three times a day (TID) | INTRAMUSCULAR | Status: DC
  Administered 2021-07-01 (×2): 5 [IU] via SUBCUTANEOUS

## 2021-06-30 MED ORDER — DEXTROSE 50 % IV SOLN
INTRAVENOUS | Status: AC
  Administered 2021-06-30: 50 mL
  Filled 2021-06-30: qty 50

## 2021-06-30 MED ORDER — DILTIAZEM HCL ER COATED BEADS 120 MG PO CP24
120.0000 mg | ORAL_CAPSULE | Freq: Every day | ORAL | Status: DC
  Administered 2021-06-30 – 2021-07-01 (×2): 120 mg via ORAL
  Filled 2021-06-30 (×2): qty 1

## 2021-06-30 MED ORDER — ADULT MULTIVITAMIN W/MINERALS CH
1.0000 | ORAL_TABLET | Freq: Every day | ORAL | Status: DC
  Administered 2021-06-30 – 2021-07-01 (×2): 1 via ORAL
  Filled 2021-06-30 (×2): qty 1

## 2021-06-30 MED ORDER — INSULIN ASPART 100 UNIT/ML IJ SOLN
10.0000 [IU] | Freq: Three times a day (TID) | INTRAMUSCULAR | Status: DC
  Administered 2021-06-30: 10 [IU] via SUBCUTANEOUS

## 2021-06-30 NOTE — Progress Notes (Signed)
Chaplain assisted patient in having his HCPOA notarized and witnessed.  A copy was placed in patient's chart and the original (plus two copies for each proxy) were given to patient.  [place], Bcc Pager, [phone] 10:41 PM

## 2021-06-30 NOTE — Progress Notes (Addendum)
PROGRESS NOTE    Tim Spencer   EGB:[id number]  DOB: 04/27/1961  PCP: Aretta Nip, MD    DOA: 06/28/2021 LOS: 1    Brief Narrative / Hospital Course to Date:   60 year old male with past medical history of hypertension, hypothyroidism, insulin-dependent type 2 diabetes and CKD stage IIIa presented to the [facility] long ED on 06/28/2021 with progressive generalized weakness/malaise, loss of appetite and nausea with 2 episodes of nonbloody nonbilious emesis the day prior to presenting.  He had been prescribed course of Lasix for lower extremity swelling proximately 3 weeks ago.  Evaluation in the ED revealed uncontrolled blood pressure 200/90, hyperglycemia glucose 221, creatinine 1.73 and hyponatremia with sodium 123.  UA showed proteinuria, glucosuria and ketonuria.  Admitted to [facility] service for further evaluation management of hyponatremia and hypertensive urgency.  Assessment & Plan   Principal Problem:   Hyponatremia Active Problems:   Hypertensive urgency   Chronic kidney disease, stage 3a ([place])   Insulin-requiring or dependent type II diabetes mellitus (HCC)   Hypothyroidism   Hyponatremia -presented with sodium 123, corrected to 126 in setting of hyperglycemia.  Suspect hypovolemic in the setting of 2 weeks of Lasix, poor appetite with nausea and vomiting.  Patient was prescribed 2 weeks Lasix for lower extremity edema. Chart reviewed and patient has history of intermittent hyponatremia 10/7: Sodium improving with IV hydration, 130 today -- Diuretics on hold --Continue IV hydration --BMP in the morning --Check AM cortisol, TSH, serum and urine osmolality, urine sodium for further evaluation.  Hypertensive urgency -BP as high as 199/92 in the ED.  Patient asymptomatic.  Recent changes to antihypertensives made in the outpatient setting.  He and wife report current medications include hydralazine, olmesartan, metoprolol, amlodipine had been changed to diltiazem,  clonidine. 10/7: BP remains poorly controlled today. --Amlodipine changed to diltiazem as outpatient. --DC amlodipine --Continue diltiazem, clonidine, hydralazine (increased to 100) --Continue losartan substituted for home olmesartan --Changed metoprolol to Coreg for better BP effect -- Patient was on short acting diltiazem once daily (typically 4 times daily dosing), changed to long-acting diltiazem at 120 mg daily --As needed oral hydralazine if systolic BP above 371 06/2: Increase Coreg and hydralazine  History of orthostatic hypotension, chronic - orthostatic vitals negative today 10/7.  Caution with antihypertensive regimen.  Patient counseled extensively on using caution when getting up to prevent falls or loss of consciousness.  CKD stage IIIa -presented with creatinine 1.73, up slightly from 1.51 in May 2021.  Suspect this is near his baseline.  On IV hydration for hyponatremia as above. Renal function improving with IV hydration --Monitor BMP  Type 2 diabetes with hyperglycemia -A1c a year ago 6.9%. Continue basal insulin and sliding scale NovoLog. Home regimen appears to be Toujeo 30 units daily, Humalog 10 to 15 units 3 times daily before meals. --Increased glargine to 25 units daily --NovoLog 5 units in addition to sliding scale   Patient BMI: Body mass index is 25.22 kg/m.   DVT prophylaxis: enoxaparin (LOVENOX) injection 40 mg Start: 06/28/21 2200   Diet:  Diet Orders (From admission, onward)     Start     Ordered   06/29/21 0944  Diet Carb Modified Fluid consistency: Thin; Room service appropriate? Yes; Fluid restriction: 1500 mL Fluid  Diet effective now       Comments: Vegetarian  Question Answer Comment  Diet-HS Snack? Nothing   Calorie Level Medium 1600-2000   Fluid consistency: Thin   Room service appropriate? Yes   Fluid  restriction: 1500 mL Fluid      06/29/21 0943              Code Status: Full Code   Subjective 06/30/21    Patient seen  with wife at bedside on rounds today.  He reports sleeping well overnight.  Feeling better in general.  Tolerating meals without any abdominal pain nausea or vomiting.  Still concern blood pressure running high.  He talks about his history of orthostatic hypotension and concern for this as we get blood pressure under control.   Disposition Plan & Communication   Status is: Inpatient  Remains inpatient appropriate because:IV treatments appropriate due to intensity of illness or inability to take PO  Dispo: The patient is from: Home              Anticipated d/c is to: Home              Patient currently is not medically stable to d/c.   Difficult to place patient No   Family Communication: Wife at bedside on rounds today 10/7.   Consults, Procedures, Significant Events   Consultants:  none  Procedures:  none  Antimicrobials:  Anti-infectives (From admission, onward)    None         Micro    Objective   Vitals:   06/30/21 1242 06/30/21 1245 06/30/21 1330 06/30/21 1442  BP: (!) 181/85 (!) 164/72 (!) 199/77 (!) 170/73  Pulse: 72 73  72  Resp: 20     Temp: 98.3 F (36.8 C)     TempSrc: Oral     SpO2: 100% 100%  100%  Weight:      Height:        Intake/Output Summary (Last 24 hours) at 06/30/2021 1635 Last data filed at 06/30/2021 1500 Gross per 24 hour  Intake 3146.48 ml  Output --  Net 3146.48 ml   Filed Weights   06/28/21 1634  Weight: 73 kg    Physical Exam:  General exam: awake, alert, no acute distress Respiratory system: Clear lungs bilaterally without wheezes rales or rhonchi, normal respiratory effort, on room air. Cardiovascular system: normal S1/S2,  RRR, no JVD, murmurs, rubs, gallops,  no pedal edema.   Central nervous system: Alert and oriented x3, normal speech, grossly nonfocal exam Extremities: moves all , no lower extremity edema Psychiatry: normal mood, congruent affect, judgement and insight appear normal  Labs   Data Reviewed: I  have personally reviewed following labs and imaging studies  CBC: Recent Labs  Lab 06/28/21 1727 06/29/21 0443 06/30/21 0511  WBC 9.7 8.3 7.0  NEUTROABS 7.1  --   --   HGB 11.3* 9.6* 10.6*  HCT 32.4* 27.3* 30.4*  MCV 87.8 86.9 88.1  PLT 233 161 867   Basic Metabolic Panel: Recent Labs  Lab 06/28/21 1727 06/29/21 0443 06/29/21 1521 06/30/21 0511  NA 123* 128* 128* 130*  K 4.2 3.9  --  4.1  CL 91* 97*  --  101  CO2 21* 26  --  23  GLUCOSE 221* 200*  --  214*  BUN 21 17  --  21  CREATININE 1.73* 1.82*  --  1.76*  CALCIUM 10.0 9.5  --  9.4  MG  --   --   --  1.9   GFR: Estimated Creatinine Clearance: 41.2 mL/min (A) (by C-G formula based on SCr of 1.76 mg/dL (H)). Liver Function Tests: Recent Labs  Lab 06/28/21 1727  AST 40  ALT 28  ALKPHOS 55  BILITOT 0.9  PROT 6.6  ALBUMIN 3.4*   No results for input(s): LIPASE, AMYLASE in the last 168 hours. No results for input(s): AMMONIA in the last 168 hours. Coagulation Profile: No results for input(s): INR, PROTIME in the last 168 hours. Cardiac Enzymes: No results for input(s): CKTOTAL, CKMB, CKMBINDEX, TROPONINI in the last 168 hours. BNP (last 3 results) No results for input(s): PROBNP in the last 8760 hours. HbA1C: Recent Labs    06/29/21 0443  HGBA1C 6.9*   CBG: Recent Labs  Lab 06/29/21 2039 06/30/21 0848 06/30/21 1146 06/30/21 1552 06/30/21 1617  GLUCAP 259* 211* 183* 47* 99   Lipid Profile: No results for input(s): CHOL, HDL, LDLCALC, TRIG, CHOLHDL, LDLDIRECT in the last 72 hours. Thyroid Function Tests: Recent Labs    06/30/21 0511  TSH 4.407   Anemia Panel: No results for input(s): VITAMINB12, FOLATE, FERRITIN, TIBC, IRON, RETICCTPCT in the last 72 hours. Sepsis Labs: Recent Labs  Lab 06/28/21 1805  LATICACIDVEN 1.2    Recent Results (from the past 240 hour(s))  Resp Panel by RT-PCR (Flu A&B, Covid)     Status: None   Collection Time: 06/28/21  5:20 PM   Specimen:  Nasopharyngeal(NP) swabs in vial transport medium  Result Value Ref Range Status   SARS Coronavirus 2 by RT PCR NEGATIVE NEGATIVE Final    Comment: (NOTE) SARS-CoV-2 target nucleic acids are NOT DETECTED.  The SARS-CoV-2 RNA is generally detectable in upper respiratory specimens during the acute phase of infection. The lowest concentration of SARS-CoV-2 viral copies this assay can detect is 138 copies/mL. A negative result does not preclude SARS-Cov-2 infection and should not be used as the sole basis for treatment or other patient management decisions. A negative result may occur with  improper specimen collection/handling, submission of specimen other than nasopharyngeal swab, presence of viral mutation(s) within the areas targeted by this assay, and inadequate number of viral copies(<138 copies/mL). A negative result must be combined with clinical observations, patient history, and epidemiological information. The expected result is Negative.  Fact Sheet for Patients:  EntrepreneurPulse.com.au  Fact Sheet for Healthcare Providers:  IncredibleEmployment.be  This test is no t yet approved or cleared by the [place] FDA and  has been authorized for detection and/or diagnosis of SARS-CoV-2 by FDA under an Emergency Use Authorization (EUA). This EUA will remain  in effect (meaning this test can be used) for the duration of the COVID-19 declaration under Section 564(b)(1) of the Act, 21 U.S.C.section 360bbb-3(b)(1), unless the authorization is terminated  or revoked sooner.       Influenza A by PCR NEGATIVE NEGATIVE Final   Influenza B by PCR NEGATIVE NEGATIVE Final    Comment: (NOTE) The Xpert Xpress SARS-CoV-2/FLU/RSV plus assay is intended as an aid in the diagnosis of influenza from Nasopharyngeal swab specimens and should not be used as a sole basis for treatment. Nasal washings and aspirates are unacceptable for Xpert Xpress  SARS-CoV-2/FLU/RSV testing.  Fact Sheet for Patients: EntrepreneurPulse.com.au  Fact Sheet for Healthcare Providers: IncredibleEmployment.be  This test is not yet approved or cleared by the [place] FDA and has been authorized for detection and/or diagnosis of SARS-CoV-2 by FDA under an Emergency Use Authorization (EUA). This EUA will remain in effect (meaning this test can be used) for the duration of the COVID-19 declaration under Section 564(b)(1) of the Act, 21 U.S.C. section 360bbb-3(b)(1), unless the authorization is terminated or revoked.  Performed at [facility], [place]  [address] [place], [place] [address]   Urine Culture     Status: Abnormal   Collection Time: 06/28/21  5:21 PM   Specimen: Urine, Clean Catch  Result Value Ref Range Status   Specimen Description   Final    URINE, CLEAN CATCH Performed at [facility], [place] [address] [place], [place] [address]    Special Requests   Final    NONE Performed at [facility], [place] [address] [place], [place] [address]    Culture (A)  Final    <10,000 COLONIES/mL INSIGNIFICANT GROWTH Performed at [facility] [address], [place], [place] [address]    Report Status 06/30/2021 FINAL  Final  Culture, blood (routine x 2)     Status: None (Preliminary result)   Collection Time: 06/28/21  6:05 PM   Specimen: BLOOD RIGHT WRIST  Result Value Ref Range Status   Specimen Description   Final    BLOOD RIGHT WRIST Performed at [facility] [address], [place], [place] [address]    Special Requests   Final    BOTTLES DRAWN AEROBIC AND ANAEROBIC Blood Culture adequate volume Performed at [facility] [address], [place], [place] [address]    Culture   Final    NO GROWTH 2 DAYS Performed at [facility] [address], [place], [place] [address]    Report Status  PENDING  Incomplete  Culture, blood (routine x 2)     Status: None (Preliminary result)   Collection Time: 06/28/21  6:10 PM   Specimen: BLOOD RIGHT HAND  Result Value Ref Range Status   Specimen Description   Final    BLOOD RIGHT HAND Performed at [facility] [address], [place], [place] [address]    Special Requests   Final    BOTTLES DRAWN AEROBIC AND ANAEROBIC Blood Culture adequate volume Performed at [facility] [address], [place], [place] [address]    Culture   Final    NO GROWTH 2 DAYS Performed at [facility] [address], [place], [place] [address]    Report Status PENDING  Incomplete      Imaging Studies   No results found.   Medications   Scheduled Meds:  atorvastatin  20 mg Oral QPC lunch   carvedilol  25 mg Oral BID WC   cloNIDine  0.2 mg Oral QPM   diltiazem  120 mg Oral Daily   enoxaparin (LOVENOX) injection  40 mg Subcutaneous Q24H   gabapentin  300 mg Oral TID   hydrALAZINE  50 mg Oral TID   insulin aspart  0-5 Units Subcutaneous QHS   insulin aspart  0-9 Units Subcutaneous TID WC   insulin aspart  5 Units Subcutaneous TID WC   insulin glargine-yfgn  25 Units Subcutaneous Daily   levothyroxine  125 mcg Oral Q0600   Continuous Infusions:  sodium chloride 100 mL/hr at 06/30/21 0531       LOS: 1 day    Time spent: 30 minutes with > 50% spent at bedside and in coordination of care     Ezekiel Slocumb, DO [facility]  06/30/2021, 4:35 PM      If 7PM-7AM, please contact night-coverage. How to contact the [facility] Attending or Consulting provider [place] or covering provider during after hours [place], for this patient?    Check the care team in [facility] and look  for a) attending/consulting [place] provider listed and b) the [facility] team listed Log into [email or web address] and use [place]'s universal password to access. If you do not have the password, please contact the hospital  operator. Locate the [facility] provider you are looking for under [facility] and page to a number that you can be directly reached. If you still have difficulty reaching the provider, please page the [facility] (Director on Call) for the Hospitalists listed on amion for assistance.

## 2021-06-30 NOTE — Progress Notes (Signed)
Pt and spouse informed they have completed Living Will and would like to complete the process as well as organ donation.

## 2021-06-30 NOTE — Progress Notes (Signed)
Initial Nutrition Assessment  DOCUMENTATION CODES:   Not applicable  INTERVENTION:   -Glucerna Shake po BID, each supplement provides 220 kcal and 10 grams of protein  -MVI with minerals daily  NUTRITION DIAGNOSIS:   Inadequate oral intake related to poor appetite as evidenced by per patient/family report.  GOAL:   Patient will meet greater than or equal to 90% of their needs  MONITOR:   PO intake, Supplement acceptance, Labs, Weight trends, Skin, I & O's  REASON FOR ASSESSMENT:   Malnutrition Screening Tool    ASSESSMENT:   Tim Spencer is a 60 y.o. male with medical history significant for hypertension, hypothyroidism, insulin-dependent diabetes mellitus, and CKD III A, now presenting to the emergency department with general weakness/malaise and nausea.  Patient reports that he had some leg swelling recently for which she was started on Lasix approximately 3 weeks ago.  His leg swelling resolved but then over the past 3 days or so he developed general weakness, malaise, loss of appetite, and nausea which have all been worsening.  He has also had insomnia for the past 3 nights.  He had 2 episodes of vomiting yesterday and has not eaten much at all.  He denies any chest pain, shortness of breath, cough, burning with urination, abdominal pain, or flank pain.  Pt admitted with hyponatremia.   Reviewed I/O's: +2 L x 24 hours   Pt unavailable at time of visit. Attempted to speak with pt via call to hospital room phone, however, unable to reach. RD unable to obtain further nutrition-related history or complete nutrition-focused physical exam at this time.    Pt with poor oral intake for 2-3 days PTA. Noted documented meal completion 100%.   Reviewed wt hx; wt has been stable over the past 3 months.   Medications reviewed and include cardizem and 0.9% sodium chloride infusion @ 100 ml/hr.   Labs reviewed: Na: 130, CBGS: 47-211 (inpatient orders for glycemic control are 0-9  units insulin aspart TID with meals, 5 units insulin aspart TID with meals, and 25 units insulin glargine-yfgn daily).    Diet Order:   Diet Order             Diet Carb Modified Fluid consistency: Thin; Room service appropriate? Yes; Fluid restriction: 1500 mL Fluid  Diet effective now                   EDUCATION NEEDS:   No education needs have been identified at this time  Skin:  Skin Assessment: Reviewed RN Assessment  Last BM:  06/29/21  Height:   Ht Readings from Last 1 Encounters:  06/28/21 5\' 7"  (1.702 m)    Weight:   Wt Readings from Last 1 Encounters:  06/28/21 73 kg    Ideal Body Weight:  67.3 kg  BMI:  Body mass index is 25.22 kg/m.  Estimated Nutritional Needs:   Kcal:  1850-2050  Protein:  95-110 grams  Fluid:  > 1.8 L    Loistine Chance, RD, LDN, [place] Registered Dietitian II Certified Diabetes Care and Education Specialist Please refer to [facility] for RD and/or RD on-call/weekend/after hours pager

## 2021-06-30 NOTE — Plan of Care (Signed)
  Problem: Education: Goal: Knowledge of General Education information will improve Description: Including pain rating scale, medication(s)/side effects and non-pharmacologic comfort measures Outcome: Progressing   Problem: Health Behavior/Discharge Planning: Goal: Ability to manage health-related needs will improve Outcome: Progressing   Problem: Clinical Measurements: Goal: Ability to maintain clinical measurements within normal limits will improve Outcome: Progressing Goal: Will remain free from infection Outcome: Progressing Goal: Diagnostic test results will improve Outcome: Progressing   Problem: Nutrition: Goal: Adequate nutrition will be maintained Outcome: Progressing   Problem: Safety: Goal: Ability to remain free from injury will improve Outcome: Progressing   Problem: Skin Integrity: Goal: Risk for impaired skin integrity will decrease Outcome: Progressing

## 2021-07-01 LAB — GLUCOSE, CAPILLARY
Glucose-Capillary: 349 mg/dL — ABNORMAL HIGH (ref 70–99)
Glucose-Capillary: 220 mg/dL — ABNORMAL HIGH (ref 70–99)
Glucose-Capillary: 199 mg/dL — ABNORMAL HIGH (ref 70–99)

## 2021-07-01 LAB — COMPREHENSIVE METABOLIC PANEL
ALT: 24 U/L (ref 0–44)
AST: 29 U/L (ref 15–41)
Albumin: 2.3 g/dL — ABNORMAL LOW (ref 3.5–5.0)
Alkaline Phosphatase: 44 U/L (ref 38–126)
Anion gap: 4 — ABNORMAL LOW (ref 5–15)
BUN: 21 mg/dL (ref 8–23)
CO2: 22 mmol/L (ref 22–32)
Calcium: 9.2 mg/dL (ref 8.9–10.3)
Chloride: 107 mmol/L (ref 98–111)
Creatinine, Ser: 1.46 mg/dL — ABNORMAL HIGH (ref 0.61–1.24)
GFR, Estimated: 54 mL/min — ABNORMAL LOW (ref >60)
Glucose, Bld: 370 mg/dL — ABNORMAL HIGH (ref 70–99)
Potassium: 5.1 mmol/L (ref 3.5–5.1)
Sodium: 133 mmol/L — ABNORMAL LOW (ref 135–145)
Total Bilirubin: 0.4 mg/dL (ref 0.3–1.2)
Total Protein: 4.7 g/dL — ABNORMAL LOW (ref 6.5–8.1)

## 2021-07-01 MED ORDER — INSULIN ASPART 100 UNIT/ML IJ SOLN
4.0000 [IU] | Freq: Once | INTRAMUSCULAR | Status: AC
  Administered 2021-07-01: 4 [IU] via SUBCUTANEOUS

## 2021-07-01 MED ORDER — HYDRALAZINE HCL 100 MG PO TABS: 100.0000 mg | ORAL_TABLET | Freq: Three times a day (TID) | ORAL | 1 refills | Status: DC

## 2021-07-01 MED ORDER — CLONIDINE HCL 0.2 MG PO TABS: 0.2000 mg | ORAL_TABLET | Freq: Every evening | ORAL | 1 refills | Status: DC

## 2021-07-01 MED ORDER — DILTIAZEM HCL ER COATED BEADS 120 MG PO CP24: 120.0000 mg | ORAL_CAPSULE | Freq: Every day | ORAL | 1 refills | Status: DC

## 2021-07-01 MED ORDER — COVID-19MRNA BIVAL VACC PFIZER 30 MCG/0.3ML IM SUSP
0.3000 mL | Freq: Once | INTRAMUSCULAR | Status: AC
  Administered 2021-07-01: 0.3 mL via INTRAMUSCULAR
  Filled 2021-07-01: qty 0.3

## 2021-07-01 MED ORDER — CARVEDILOL 25 MG PO TABS: 25.0000 mg | ORAL_TABLET | Freq: Two times a day (BID) | ORAL | 1 refills | Status: DC

## 2021-07-01 NOTE — Plan of Care (Signed)
Education of discharge instructions. Education of insulin injections. Education of importance of followup with PCP.

## 2021-07-01 NOTE — Discharge Summary (Signed)
Physician Discharge Summary  Antwann Preziosi EQA:[id number] DOB: 25-Jun-1961 DOA: 06/28/2021  PCP: Aretta Nip, MD  Admit date: 06/28/2021 Discharge date: 07/01/2021  Admitted From: home Disposition:  home  Recommendations for Outpatient Follow-up:  Follow up with PCP in 1-2 weeks Please obtain BMP/CBC in one week Please follow up on blood glucose control Please follow up on blood pressure control and orthostatic hypotension  Home Health: no Equipment/Devices: none  Discharge Condition: stable CODE STATUS: full  Diet recommendation: Carb Modified     Discharge Diagnoses: Principal Problem:   Hyponatremia Active Problems:   Hypertensive urgency   Chronic kidney disease, stage 3a ([place])   Insulin-requiring or dependent type II diabetes mellitus ([place])   Hypothyroidism    Summary of HPI and Hospital Course:  60 year old male with past medical history of hypertension, hypothyroidism, insulin-dependent type 2 diabetes and CKD stage IIIa presented to the [facility] long ED on 06/28/2021 with progressive generalized weakness/malaise, loss of appetite and nausea with 2 episodes of nonbloody nonbilious emesis the day prior to presenting.  He had been prescribed course of Lasix for lower extremity swelling proximately 3 weeks ago.   Evaluation in the ED revealed uncontrolled blood pressure 200/90, hyperglycemia glucose 221, creatinine 1.73 and hyponatremia with sodium 123.  UA showed proteinuria, glucosuria and ketonuria.   Admitted to [facility] service for further evaluation management of hyponatremia and hypertensive urgency    Hyponatremia -presented with sodium 123, corrected to 126 in setting of hyperglycemia.  Suspect hypovolemic in the setting of 2 weeks of Lasix, poor appetite with nausea and vomiting.  Patient was prescribed 2 weeks Lasix for lower extremity edema. Chart reviewed and patient has history of intermittent hyponatremia 10/7: Sodium improving with IV hydration, 130  today -- Diuretics on hold --Continue IV hydration --BMP in the morning --Check AM cortisol, TSH, serum and urine osmolality, urine sodium for further evaluation.   Hypertensive urgency -BP as high as 199/92 in the ED.  Patient asymptomatic.   Recent changes to antihypertensives made in the outpatient setting.  He and wife report current medications include hydralazine, olmesartan, metoprolol, amlodipine had been changed to diltiazem, clonidine. --Antihpyertensive regiment was adjusted, meds at discharge as below. --Amlodipine changed to diltiazem as outpatient. --Continue diltiazem, clonidine --Losartan substituted for home olmesartan --Changed metoprolol to Coreg for better BP effect -- Patient was on short acting diltiazem once daily (typically 4 times daily dosing), changed to long-acting diltiazem at 120 mg daily --No diuretic given stable volume status and p/w hyponatremia after course of Lasix outpatient.  Patient advised monitor for swelling and weight daily.   History of orthostatic hypotension, chronic - orthostatic vitals negative today 10/7.   Caution with antihypertensive regimen.   Patient counseled extensively on using caution when getting up to prevent falls or loss of consciousness. --Close outpatient follow up   CKD stage IIIa -presented with creatinine 1.73, up slightly from 1.51 in May 2021.  Suspect this is near his baseline.  Treated with IV hydration for hyponatremia as above.  Renal function improved --Monitor BMP in follow up   Type 2 diabetes with hyperglycemia -A1c a year ago 6.9%. Treated with basal insulin and sliding scale NovoLog. Home regimen Toujeo 30 units daily, Humalog 10 to 15 units 3 times daily before meals. --Resume home regimen at discharge --Close outpatient follow up.     Discharge Instructions   Discharge Instructions     Call MD for:  extreme fatigue   Complete by: As directed    Call  MD for:  persistant dizziness or light-headedness    Complete by: As directed    Call MD for:  persistant nausea and vomiting   Complete by: As directed    Call MD for:  temperature >100.4   Complete by: As directed    Diet - low sodium heart healthy   Complete by: As directed    Discharge instructions   Complete by: As directed    Please check your blood pressure at home 2-3 times each day until you follow-up with your primary care doctor.  We made several changes to blood pressure medications.  If you have any questions or concerns about your medications contact your primary care, cardiologist or nephrologist.  If you are having uncontrolled high blood pressures at home be sure to call your doctor for instructions. (Top BP number staying above 150 or bottom BP numbers staying above 90).  If you are having low blood pressures, with top number below 100 or bottom number below 60, you should not take your blood pressure medications and call your doctor.  If you see BP at that low you may feel dizzy or lightheaded.  Because you have history of orthostatic hypotension (blood pressure dropping when you stand up), please be careful when getting up from a laying down or sitting to make sure you are not dizzy and have a fall.   Staying well-hydrated helps to prevent this problem.  Please drink normal amount of fluids each day, about 64 ounces. You should consume a relatively normal amount of sodium in your diet each day as well, about 2000 mg or less.  If you start having swelling in your legs again, you may need a very short course of Lasix.  I will not prescribe this today because it was likely part of what caused your sodium to drop below.  Please contact your primary care doctor on Monday morning for an appointment to recheck your labs within the next week or 2.  --Dr. Nicole Kindred, DO [facility]   Increase activity slowly   Complete by: As directed       Allergies as of 07/01/2021   No Known Allergies      Medication  List     STOP taking these medications    amLODipine 5 MG tablet Commonly known as: NORVASC   diltiazem 60 MG tablet Commonly known as: CARDIZEM   furosemide 20 MG tablet Commonly known as: LASIX   losartan 100 MG tablet Commonly known as: COZAAR   metoprolol succinate 50 MG 24 hr tablet Commonly known as: TOPROL-XL   olmesartan 40 MG tablet Commonly known as: BENICAR       TAKE these medications    atorvastatin 20 MG tablet Commonly known as: LIPITOR Take 20 mg by mouth daily after lunch.   carvedilol 25 MG tablet Commonly known as: COREG Take 1 tablet (25 mg total) by mouth 2 (two) times daily with a meal.   cloNIDine 0.2 MG tablet Commonly known as: CATAPRES Take 1 tablet (0.2 mg total) by mouth every evening. What changed:  medication strength how much to take when to take this additional instructions   diltiazem 120 MG 24 hr capsule Commonly known as: CARDIZEM CD Take 1 capsule (120 mg total) by mouth daily. Start taking on: July 02, 2021   ferrous sulfate 325 (65 FE) MG tablet Take 650 mg by mouth daily after supper.   FreeStyle Libre 2 Sensor Misc Inject into the skin every 14 (fourteen) days.  gabapentin 300 MG capsule Commonly known as: NEURONTIN Take 300 mg by mouth 3 (three) times daily.   HumaLOG KwikPen 100 UNIT/ML KwikPen Generic drug: insulin lispro Inject 10-15 Units into the skin 3 (three) times daily before meals. Sliding Scale Insulin   hydrALAZINE 100 MG tablet Commonly known as: APRESOLINE Take 1 tablet (100 mg total) by mouth 3 (three) times daily. What changed:  medication strength how much to take   levothyroxine 125 MCG tablet Commonly known as: SYNTHROID Take 125 mcg by mouth daily before breakfast.   magnesium oxide 400 MG tablet Commonly known as: MAG-OX Take 400 mg by mouth daily after lunch.   multivitamin with minerals Tabs tablet Take 1 tablet by mouth daily after lunch.   Omega-3 1000 MG Caps Take  2,000 mg by mouth at bedtime.   Toujeo SoloStar 300 UNIT/ML Solostar Pen Generic drug: insulin glargine (1 Unit Dial) Inject 30 Units into the skin daily. 30   vitamin B-12 1000 MCG tablet Commonly known as: CYANOCOBALAMIN Take 1,000 mcg by mouth daily.        No Known Allergies   If you experience worsening of your admission symptoms, develop shortness of breath, life threatening emergency, suicidal or homicidal thoughts you must seek medical attention immediately by calling 911 or calling your MD immediately  if symptoms less severe.    Please note   You were cared for by a hospitalist during your hospital stay. If you have any questions about your discharge medications or the care you received while you were in the hospital after you are discharged, you can call the unit and asked to speak with the hospitalist on call if the hospitalist that took care of you is not available. Once you are discharged, your primary care physician will handle any further medical issues. Please note that NO REFILLS for any discharge medications will be authorized once you are discharged, as it is imperative that you return to your primary care physician (or establish a relationship with a primary care physician if you do not have one) for your aftercare needs so that they can reassess your need for medications and monitor your lab values.   Consultations: none    Procedures/Studies: None  Subjective: Pt feels better.  Less dizzy on standing today.  No acute complaints.  Requests discharge home.  He was informed today his aunt in [place] has passed away unexpected.  He is clearly upset and feeling stressed by this.  Wife at bedside will be able to help him at home if needed.    Discharge Exam: Vitals:   07/01/21 1059 07/01/21 1101  BP: (!) 157/62 (!) 162/57  Pulse: 67 64  Resp:    Temp:    SpO2: 100% 100%   Vitals:   07/01/21 1056 07/01/21 1057 07/01/21 1059 07/01/21 1101  BP: (!) 179/70 (!)  167/69 (!) 157/62 (!) 162/57  Pulse: 67 69 67 64  Resp: 19     Temp:      TempSrc:      SpO2: 100% 99% 100% 100%  Weight:      Height:        General: Pt is alert, awake, not in acute distress Cardiovascular: RRR, S1/S2 +, no rubs, no gallops Respiratory: CTA bilaterally, no wheezing, no rhonchi Abdominal: Soft, NT, ND, bowel sounds + Extremities: no edema, no cyanosis    The results of significant diagnostics from this hospitalization (including imaging, microbiology, ancillary and laboratory) are listed below for reference.  Microbiology: Recent Results (from the past 240 hour(s))  Resp Panel by RT-PCR (Flu A&B, Covid)     Status: None   Collection Time: 06/28/21  5:20 PM   Specimen: Nasopharyngeal(NP) swabs in vial transport medium  Result Value Ref Range Status   SARS Coronavirus 2 by RT PCR NEGATIVE NEGATIVE Final    Comment: (NOTE) SARS-CoV-2 target nucleic acids are NOT DETECTED.  The SARS-CoV-2 RNA is generally detectable in upper respiratory specimens during the acute phase of infection. The lowest concentration of SARS-CoV-2 viral copies this assay can detect is 138 copies/mL. A negative result does not preclude SARS-Cov-2 infection and should not be used as the sole basis for treatment or other patient management decisions. A negative result may occur with  improper specimen collection/handling, submission of specimen other than nasopharyngeal swab, presence of viral mutation(s) within the areas targeted by this assay, and inadequate number of viral copies(<138 copies/mL). A negative result must be combined with clinical observations, patient history, and epidemiological information. The expected result is Negative.  Fact Sheet for Patients:  EntrepreneurPulse.com.au  Fact Sheet for Healthcare Providers:  IncredibleEmployment.be  This test is no t yet approved or cleared by the [place] FDA and  has been  authorized for detection and/or diagnosis of SARS-CoV-2 by FDA under an Emergency Use Authorization (EUA). This EUA will remain  in effect (meaning this test can be used) for the duration of the COVID-19 declaration under Section 564(b)(1) of the Act, 21 U.S.C.section 360bbb-3(b)(1), unless the authorization is terminated  or revoked sooner.       Influenza A by PCR NEGATIVE NEGATIVE Final   Influenza B by PCR NEGATIVE NEGATIVE Final    Comment: (NOTE) The Xpert Xpress SARS-CoV-2/FLU/RSV plus assay is intended as an aid in the diagnosis of influenza from Nasopharyngeal swab specimens and should not be used as a sole basis for treatment. Nasal washings and aspirates are unacceptable for Xpert Xpress SARS-CoV-2/FLU/RSV testing.  Fact Sheet for Patients: EntrepreneurPulse.com.au  Fact Sheet for Healthcare Providers: IncredibleEmployment.be  This test is not yet approved or cleared by the [place] FDA and has been authorized for detection and/or diagnosis of SARS-CoV-2 by FDA under an Emergency Use Authorization (EUA). This EUA will remain in effect (meaning this test can be used) for the duration of the COVID-19 declaration under Section 564(b)(1) of the Act, 21 U.S.C. section 360bbb-3(b)(1), unless the authorization is terminated or revoked.  Performed at [facility], [place] [address] [place], [place] [address]   Urine Culture     Status: Abnormal   Collection Time: 06/28/21  5:21 PM   Specimen: Urine, Clean Catch  Result Value Ref Range Status   Specimen Description   Final    URINE, CLEAN CATCH Performed at [facility], [place] [address] [place], [place] [address]    Special Requests   Final    NONE Performed at [facility], [place] [address], [place], [place] [address]    Culture (A)  Final    <10,000 COLONIES/mL INSIGNIFICANT GROWTH Performed at [place] [address] [place], [place] [address]    Report Status 06/30/2021 FINAL  Final  Culture, blood (routine x 2)     Status: None (Preliminary result)   Collection Time: 06/28/21  6:05 PM   Specimen: BLOOD RIGHT WRIST  Result Value Ref Range Status   Specimen Description   Final    BLOOD RIGHT WRIST Performed at [facility],  [place] [address] [place], [place] [address]    Special Requests   Final    BOTTLES DRAWN AEROBIC AND ANAEROBIC Blood Culture adequate volume Performed at [place] [address], [place], [place] [address]    Culture   Final    NO GROWTH 2 DAYS Performed at [place] [address], [place], [place] [address]    Report Status PENDING  Incomplete  Culture, blood (routine x 2)     Status: None (Preliminary result)   Collection Time: 06/28/21  6:10 PM   Specimen: BLOOD RIGHT HAND  Result Value Ref Range Status   Specimen Description   Final    BLOOD RIGHT HAND Performed at [place] [address], [place], [place] [address]    Special Requests   Final    BOTTLES DRAWN AEROBIC AND ANAEROBIC Blood Culture adequate volume Performed at [facility] [address], [place], [place] [address]    Culture   Final    NO GROWTH 2 DAYS Performed at [facility] [address], [place], [place] [address]    Report Status PENDING  Incomplete     Labs: BNP (last 3 results) Recent Labs    06/28/21 1727 06/29/21 1521  BNP 429.3* 654.6*   Basic Metabolic Panel: Recent Labs  Lab 06/28/21 1727 06/29/21 0443 06/29/21 1521 06/30/21 0511 07/01/21 0154  NA 123* 128* 128* 130* 133*  K 4.2 3.9  --  4.1 5.1  CL 91* 97*  --  101 107  CO2 21* 26  --  23 22  GLUCOSE 221* 200*  --  214* 370*  BUN 21 17  --  21 21  CREATININE 1.73* 1.82*  --  1.76* 1.46*  CALCIUM 10.0 9.5  --  9.4 9.2  MG  --   --   --  1.9  --    Liver Function Tests: Recent Labs  Lab  06/28/21 1727 07/01/21 0154  AST 40 29  ALT 28 24  ALKPHOS 55 44  BILITOT 0.9 0.4  PROT 6.6 4.7*  ALBUMIN 3.4* 2.3*   No results for input(s): LIPASE, AMYLASE in the last 168 hours. No results for input(s): AMMONIA in the last 168 hours. CBC: Recent Labs  Lab 06/28/21 1727 06/29/21 0443 06/30/21 0511  WBC 9.7 8.3 7.0  NEUTROABS 7.1  --   --   HGB 11.3* 9.6* 10.6*  HCT 32.4* 27.3* 30.4*  MCV 87.8 86.9 88.1  PLT 233 161 190   Cardiac Enzymes: No results for input(s): CKTOTAL, CKMB, CKMBINDEX, TROPONINI in the last 168 hours. BNP: Invalid input(s): POCBNP CBG: Recent Labs  Lab 06/30/21 1617 06/30/21 2133 07/01/21 0117 07/01/21 0457 07/01/21 0753  GLUCAP 99 262* 349* 220* 199*   D-Dimer No results for input(s): DDIMER in the last 72 hours. Hgb A1c Recent Labs    06/29/21 0443  HGBA1C 6.9*   Lipid Profile No results for input(s): CHOL, HDL, LDLCALC, TRIG, CHOLHDL, LDLDIRECT in the last 72 hours. Thyroid function studies Recent Labs    06/30/21 0511  TSH 4.407   Anemia work up No results for input(s): VITAMINB12, FOLATE, FERRITIN, TIBC, IRON, RETICCTPCT in the last 72 hours. Urinalysis    Component Value Date/Time   COLORURINE YELLOW 06/28/2021 1721   APPEARANCEUR CLEAR 06/28/2021 1721   LABSPEC 1.009 06/28/2021 1721   PHURINE 7.0 06/28/2021 1721   GLUCOSEU 150 (A) 06/28/2021 1721   HGBUR NEGATIVE 06/28/2021 1721  BILIRUBINUR NEGATIVE 06/28/2021 1721   KETONESUR 5 (A) 06/28/2021 1721   PROTEINUR >=300 (A) 06/28/2021 1721   NITRITE NEGATIVE 06/28/2021 1721   LEUKOCYTESUR NEGATIVE 06/28/2021 1721   Sepsis Labs Invalid input(s): PROCALCITONIN,  WBC,  LACTICIDVEN Microbiology Recent Results (from the past 240 hour(s))  Resp Panel by RT-PCR (Flu A&B, Covid)     Status: None   Collection Time: 06/28/21  5:20 PM   Specimen: Nasopharyngeal(NP) swabs in vial transport medium  Result Value Ref Range Status   SARS Coronavirus 2 by RT PCR NEGATIVE  NEGATIVE Final    Comment: (NOTE) SARS-CoV-2 target nucleic acids are NOT DETECTED.  The SARS-CoV-2 RNA is generally detectable in upper respiratory specimens during the acute phase of infection. The lowest concentration of SARS-CoV-2 viral copies this assay can detect is 138 copies/mL. A negative result does not preclude SARS-Cov-2 infection and should not be used as the sole basis for treatment or other patient management decisions. A negative result may occur with  improper specimen collection/handling, submission of specimen other than nasopharyngeal swab, presence of viral mutation(s) within the areas targeted by this assay, and inadequate number of viral copies(<138 copies/mL). A negative result must be combined with clinical observations, patient history, and epidemiological information. The expected result is Negative.  Fact Sheet for Patients:  EntrepreneurPulse.com.au  Fact Sheet for Healthcare Providers:  IncredibleEmployment.be  This test is no t yet approved or cleared by the [place] FDA and  has been authorized for detection and/or diagnosis of SARS-CoV-2 by FDA under an Emergency Use Authorization (EUA). This EUA will remain  in effect (meaning this test can be used) for the duration of the COVID-19 declaration under Section 564(b)(1) of the Act, 21 U.S.C.section 360bbb-3(b)(1), unless the authorization is terminated  or revoked sooner.       Influenza A by PCR NEGATIVE NEGATIVE Final   Influenza B by PCR NEGATIVE NEGATIVE Final    Comment: (NOTE) The Xpert Xpress SARS-CoV-2/FLU/RSV plus assay is intended as an aid in the diagnosis of influenza from Nasopharyngeal swab specimens and should not be used as a sole basis for treatment. Nasal washings and aspirates are unacceptable for Xpert Xpress SARS-CoV-2/FLU/RSV testing.  Fact Sheet for Patients: EntrepreneurPulse.com.au  Fact Sheet for Healthcare  Providers: IncredibleEmployment.be  This test is not yet approved or cleared by the [place] FDA and has been authorized for detection and/or diagnosis of SARS-CoV-2 by FDA under an Emergency Use Authorization (EUA). This EUA will remain in effect (meaning this test can be used) for the duration of the COVID-19 declaration under Section 564(b)(1) of the Act, 21 U.S.C. section 360bbb-3(b)(1), unless the authorization is terminated or revoked.  Performed at [facility], [place] [address], [place], [place] [address]   Urine Culture     Status: Abnormal   Collection Time: 06/28/21  5:21 PM   Specimen: Urine, Clean Catch  Result Value Ref Range Status   Specimen Description   Final    URINE, CLEAN CATCH Performed at [facility], [place] [address], [place], [place] [address]    Special Requests   Final    NONE Performed at [facility], [place] [address], [place], [place] [address]    Culture (A)  Final    <10,000 COLONIES/mL INSIGNIFICANT GROWTH Performed at [place] [address], [place], [place] [address]    Report Status 06/30/2021 FINAL  Final  Culture, blood (routine x 2)     Status: None (  Preliminary result)   Collection Time: 06/28/21  6:05 PM   Specimen: BLOOD RIGHT WRIST  Result Value Ref Range Status   Specimen Description   Final    BLOOD RIGHT WRIST Performed at [place] [address], [place], [place] [address]    Special Requests   Final    BOTTLES DRAWN AEROBIC AND ANAEROBIC Blood Culture adequate volume Performed at [place] [address], [place], [place] [address]    Culture   Final    NO GROWTH 2 DAYS Performed at [place] [address] [place], [place] [address]    Report Status PENDING  Incomplete  Culture, blood (routine x 2)     Status: None (Preliminary result)   Collection Time: 06/28/21  6:10 PM    Specimen: BLOOD RIGHT HAND  Result Value Ref Range Status   Specimen Description   Final    BLOOD RIGHT HAND Performed at [place] [address], [place], [place] [address]    Special Requests   Final    BOTTLES DRAWN AEROBIC AND ANAEROBIC Blood Culture adequate volume Performed at [place] [address] [place], [place] [address]    Culture   Final    NO GROWTH 2 DAYS Performed at [facility] [address] [place],  [address]    Report Status PENDING  Incomplete     Time coordinating discharge: Over 30 minutes  SIGNED:   Ezekiel Slocumb, DO [facility] 07/01/2021, 11:57 AM   If 7PM-7AM, please contact night-coverage [email or web address]

## 2021-07-01 NOTE — Progress Notes (Signed)
Pt now leaving with his spouse. Alert and oriented and wanting to go home. Third Phizer booster shot give to Right Deltoid. Discharge instructions given/explained with pt and his spouse verbalizing understanding.  Pt aware to pickup medications at said pharmacy.  Pt and spouse aware of importance of followup with PCP.

## 2021-07-03 LAB — CULTURE, BLOOD (ROUTINE X 2)
Culture: NO GROWTH
Culture: NO GROWTH
Special Requests: ADEQUATE
Special Requests: ADEQUATE

## 2021-07-21 ENCOUNTER — Emergency Department (HOSPITAL_COMMUNITY): Payer: BC Managed Care – PPO

## 2021-07-21 ENCOUNTER — Encounter (HOSPITAL_COMMUNITY): Payer: Self-pay

## 2021-07-21 ENCOUNTER — Other Ambulatory Visit: Payer: Self-pay

## 2021-07-21 ENCOUNTER — Inpatient Hospital Stay (HOSPITAL_COMMUNITY)
Admission: EM | Admit: 2021-07-21 | Discharge: 2021-07-25 | DRG: 291 | Disposition: A | Discharge status: Home / Self Care | Payer: BC Managed Care – PPO | Attending: Family Medicine | Admitting: Family Medicine

## 2021-07-21 DIAGNOSIS — R0989 Other specified symptoms and signs involving the circulatory and respiratory systems: Secondary | ICD-10-CM

## 2021-07-21 DIAGNOSIS — E039 Hypothyroidism, unspecified: Secondary | ICD-10-CM | POA: Diagnosis present

## 2021-07-21 DIAGNOSIS — I160 Hypertensive urgency: Secondary | ICD-10-CM | POA: Diagnosis present

## 2021-07-21 DIAGNOSIS — Z79890 Hormone replacement therapy (postmenopausal): Secondary | ICD-10-CM

## 2021-07-21 DIAGNOSIS — R600 Localized edema: Secondary | ICD-10-CM

## 2021-07-21 DIAGNOSIS — Z20822 Contact with and (suspected) exposure to covid-19: Secondary | ICD-10-CM | POA: Diagnosis present

## 2021-07-21 DIAGNOSIS — D649 Anemia, unspecified: Secondary | ICD-10-CM

## 2021-07-21 DIAGNOSIS — I214 Non-ST elevation (NSTEMI) myocardial infarction: Secondary | ICD-10-CM | POA: Diagnosis not present

## 2021-07-21 DIAGNOSIS — Z833 Family history of diabetes mellitus: Secondary | ICD-10-CM

## 2021-07-21 DIAGNOSIS — D631 Anemia in chronic kidney disease: Secondary | ICD-10-CM | POA: Diagnosis present

## 2021-07-21 DIAGNOSIS — E8809 Other disorders of plasma-protein metabolism, not elsewhere classified: Secondary | ICD-10-CM | POA: Diagnosis present

## 2021-07-21 DIAGNOSIS — F419 Anxiety disorder, unspecified: Secondary | ICD-10-CM | POA: Diagnosis present

## 2021-07-21 DIAGNOSIS — I440 Atrioventricular block, first degree: Secondary | ICD-10-CM | POA: Diagnosis present

## 2021-07-21 DIAGNOSIS — Z6828 Body mass index (BMI) 28.0-28.9, adult: Secondary | ICD-10-CM | POA: Diagnosis not present

## 2021-07-21 DIAGNOSIS — N1831 Chronic kidney disease, stage 3a: Secondary | ICD-10-CM | POA: Diagnosis not present

## 2021-07-21 DIAGNOSIS — N1832 Chronic kidney disease, stage 3b: Secondary | ICD-10-CM | POA: Diagnosis present

## 2021-07-21 DIAGNOSIS — K529 Noninfective gastroenteritis and colitis, unspecified: Secondary | ICD-10-CM | POA: Diagnosis present

## 2021-07-21 DIAGNOSIS — R778 Other specified abnormalities of plasma proteins: Secondary | ICD-10-CM

## 2021-07-21 DIAGNOSIS — E871 Hypo-osmolality and hyponatremia: Secondary | ICD-10-CM | POA: Diagnosis present

## 2021-07-21 DIAGNOSIS — I130 Hypertensive heart and chronic kidney disease with heart failure and stage 1 through stage 4 chronic kidney disease, or unspecified chronic kidney disease: Principal | ICD-10-CM

## 2021-07-21 DIAGNOSIS — N179 Acute kidney failure, unspecified: Secondary | ICD-10-CM | POA: Diagnosis present

## 2021-07-21 DIAGNOSIS — K5909 Other constipation: Secondary | ICD-10-CM | POA: Diagnosis present

## 2021-07-21 DIAGNOSIS — Z794 Long term (current) use of insulin: Secondary | ICD-10-CM | POA: Diagnosis not present

## 2021-07-21 DIAGNOSIS — I951 Orthostatic hypotension: Secondary | ICD-10-CM | POA: Diagnosis present

## 2021-07-21 DIAGNOSIS — E119 Type 2 diabetes mellitus without complications: Secondary | ICD-10-CM

## 2021-07-21 DIAGNOSIS — E1165 Type 2 diabetes mellitus with hyperglycemia: Secondary | ICD-10-CM | POA: Diagnosis present

## 2021-07-21 DIAGNOSIS — E669 Obesity, unspecified: Secondary | ICD-10-CM | POA: Diagnosis present

## 2021-07-21 DIAGNOSIS — Z79899 Other long term (current) drug therapy: Secondary | ICD-10-CM

## 2021-07-21 DIAGNOSIS — E861 Hypovolemia: Secondary | ICD-10-CM | POA: Diagnosis present

## 2021-07-21 DIAGNOSIS — G603 Idiopathic progressive neuropathy: Secondary | ICD-10-CM | POA: Diagnosis present

## 2021-07-21 DIAGNOSIS — N2581 Secondary hyperparathyroidism of renal origin: Secondary | ICD-10-CM | POA: Diagnosis present

## 2021-07-21 DIAGNOSIS — I5031 Acute diastolic (congestive) heart failure: Secondary | ICD-10-CM | POA: Diagnosis present

## 2021-07-21 DIAGNOSIS — R7989 Other specified abnormal findings of blood chemistry: Secondary | ICD-10-CM

## 2021-07-21 DIAGNOSIS — I248 Other forms of acute ischemic heart disease: Secondary | ICD-10-CM | POA: Diagnosis present

## 2021-07-21 DIAGNOSIS — N183 Chronic kidney disease, stage 3 unspecified: Secondary | ICD-10-CM

## 2021-07-21 DIAGNOSIS — Z8249 Family history of ischemic heart disease and other diseases of the circulatory system: Secondary | ICD-10-CM

## 2021-07-21 DIAGNOSIS — Z823 Family history of stroke: Secondary | ICD-10-CM

## 2021-07-21 DIAGNOSIS — E1122 Type 2 diabetes mellitus with diabetic chronic kidney disease: Secondary | ICD-10-CM | POA: Diagnosis present

## 2021-07-21 LAB — PROTEIN / CREATININE RATIO, URINE
Creatinine, Urine: 49.77 mg/dL
Protein Creatinine Ratio: 7.03 mg/mg{Cre} — ABNORMAL HIGH (ref 0.00–0.15)
Total Protein, Urine: 350 mg/dL

## 2021-07-21 LAB — COMPREHENSIVE METABOLIC PANEL
ALT: 23 U/L (ref 0–44)
AST: 34 U/L (ref 15–41)
Albumin: 3.1 g/dL — ABNORMAL LOW (ref 3.5–5.0)
Alkaline Phosphatase: 55 U/L (ref 38–126)
Anion gap: 10 (ref 5–15)
BUN: 29 mg/dL — ABNORMAL HIGH (ref 8–23)
CO2: 20 mmol/L — ABNORMAL LOW (ref 22–32)
Calcium: 9.2 mg/dL (ref 8.9–10.3)
Chloride: 87 mmol/L — ABNORMAL LOW (ref 98–111)
Creatinine, Ser: 2.15 mg/dL — ABNORMAL HIGH (ref 0.61–1.24)
GFR, Estimated: 34 mL/min — ABNORMAL LOW (ref >60)
Glucose, Bld: 296 mg/dL — ABNORMAL HIGH (ref 70–99)
Potassium: 3.8 mmol/L (ref 3.5–5.1)
Sodium: 117 mmol/L — CL (ref 135–145)
Total Bilirubin: 1.4 mg/dL — ABNORMAL HIGH (ref 0.3–1.2)
Total Protein: 6.2 g/dL — ABNORMAL LOW (ref 6.5–8.1)

## 2021-07-21 LAB — MAGNESIUM: Magnesium: 1.9 mg/dL (ref 1.7–2.4)

## 2021-07-21 LAB — SODIUM, URINE, RANDOM: Sodium, Ur: 15 mmol/L

## 2021-07-21 LAB — CBC WITH DIFFERENTIAL/PLATELET
Abs Immature Granulocytes: 0.02 10*3/uL (ref 0.00–0.07)
Basophils Absolute: 0 10*3/uL (ref 0.0–0.1)
Basophils Relative: 0 %
Eosinophils Absolute: 0.1 10*3/uL (ref 0.0–0.5)
Eosinophils Relative: 1 %
HCT: 28.6 % — ABNORMAL LOW (ref 39.0–52.0)
Hemoglobin: 10.2 g/dL — ABNORMAL LOW (ref 13.0–17.0)
Immature Granulocytes: 0 %
Lymphocytes Relative: 18 %
Lymphs Abs: 1.7 10*3/uL (ref 0.7–4.0)
MCH: 30.9 pg (ref 26.0–34.0)
MCHC: 35.7 g/dL (ref 30.0–36.0)
MCV: 86.7 fL (ref 80.0–100.0)
Monocytes Absolute: 0.7 10*3/uL (ref 0.1–1.0)
Monocytes Relative: 8 %
Neutro Abs: 6.7 10*3/uL (ref 1.7–7.7)
Neutrophils Relative %: 73 %
Platelets: 246 10*3/uL (ref 150–400)
RBC: 3.3 MIL/uL — ABNORMAL LOW (ref 4.22–5.81)
RDW: 11.9 % (ref 11.5–15.5)
WBC: 9.2 10*3/uL (ref 4.0–10.5)
nRBC: 0 % (ref 0.0–0.2)

## 2021-07-21 LAB — BRAIN NATRIURETIC PEPTIDE: B Natriuretic Peptide: 579.4 pg/mL — ABNORMAL HIGH (ref 0.0–100.0)

## 2021-07-21 LAB — RESP PANEL BY RT-PCR (FLU A&B, COVID) ARPGX2
Influenza A by PCR: NEGATIVE
Influenza B by PCR: NEGATIVE
SARS Coronavirus 2 by RT PCR: NEGATIVE

## 2021-07-21 LAB — TROPONIN I (HIGH SENSITIVITY)
Troponin I (High Sensitivity): 750 ng/L (ref <18)
Troponin I (High Sensitivity): 822 ng/L (ref <18)
Troponin I (High Sensitivity): 437 ng/L (ref <18)
Troponin I (High Sensitivity): 581 ng/L (ref <18)

## 2021-07-21 LAB — CREATININE, URINE, RANDOM
Creatinine, Urine: 49.52 mg/dL
Creatinine, Urine: 49.64 mg/dL

## 2021-07-21 LAB — SODIUM: Sodium: 118 mmol/L — CL (ref 135–145)

## 2021-07-21 LAB — GLUCOSE, CAPILLARY
Glucose-Capillary: 434 mg/dL — ABNORMAL HIGH (ref 70–99)
Glucose-Capillary: 375 mg/dL — ABNORMAL HIGH (ref 70–99)
Glucose-Capillary: 355 mg/dL — ABNORMAL HIGH (ref 70–99)

## 2021-07-21 LAB — LIPASE, BLOOD: Lipase: 19 U/L (ref 11–51)

## 2021-07-21 LAB — OSMOLALITY: Osmolality: 275 mOsm/kg (ref 275–295)

## 2021-07-21 LAB — HEPARIN LEVEL (UNFRACTIONATED): Heparin Unfractionated: 0.28 IU/mL — ABNORMAL LOW (ref 0.30–0.70)

## 2021-07-21 LAB — GLUCOSE, RANDOM: Glucose, Bld: 408 mg/dL — ABNORMAL HIGH (ref 70–99)

## 2021-07-21 LAB — CBG MONITORING, ED: Glucose-Capillary: 243 mg/dL — ABNORMAL HIGH (ref 70–99)

## 2021-07-21 LAB — OSMOLALITY, URINE: Osmolality, Ur: 282 mOsm/kg — ABNORMAL LOW (ref 300–900)

## 2021-07-21 LAB — CORTISOL: Cortisol, Plasma: 20.9 ug/dL

## 2021-07-21 MED ORDER — ASPIRIN 81 MG PO CHEW: 324.0000 mg | CHEWABLE_TABLET | ORAL | Status: AC

## 2021-07-21 MED ORDER — INSULIN ASPART 100 UNIT/ML IJ SOLN
0.0000 [IU] | Freq: Every day | INTRAMUSCULAR | Status: DC
  Administered 2021-07-22: 2 [IU] via SUBCUTANEOUS
  Administered 2021-07-24: 4 [IU] via SUBCUTANEOUS
  Filled 2021-07-21: qty 0.05

## 2021-07-21 MED ORDER — ONDANSETRON HCL 4 MG/2ML IJ SOLN
4.0000 mg | Freq: Once | INTRAMUSCULAR | Status: AC
  Administered 2021-07-21: 4 mg via INTRAVENOUS
  Filled 2021-07-21: qty 2

## 2021-07-21 MED ORDER — MAGNESIUM OXIDE -MG SUPPLEMENT 400 (240 MG) MG PO TABS
400.0000 mg | ORAL_TABLET | Freq: Every day | ORAL | Status: DC
  Administered 2021-07-22 – 2021-07-24 (×3): 400 mg via ORAL
  Filled 2021-07-21 (×4): qty 1

## 2021-07-21 MED ORDER — LORAZEPAM 2 MG/ML IJ SOLN
0.5000 mg | Freq: Once | INTRAMUSCULAR | Status: AC
  Administered 2021-07-21: 0.5 mg via INTRAVENOUS
  Filled 2021-07-21: qty 1

## 2021-07-21 MED ORDER — LACTATED RINGERS IV BOLUS: 1000.0000 mL | Freq: Once | INTRAVENOUS | Status: DC

## 2021-07-21 MED ORDER — HEPARIN (PORCINE) 25000 UT/250ML-% IV SOLN
1150.0000 [IU]/h | INTRAVENOUS | Status: AC
  Administered 2021-07-21: 900 [IU]/h via INTRAVENOUS
  Administered 2021-07-22: 1200 [IU]/h via INTRAVENOUS
  Administered 2021-07-23: 1250 [IU]/h via INTRAVENOUS
  Filled 2021-07-21 (×3): qty 250

## 2021-07-21 MED ORDER — ADULT MULTIVITAMIN W/MINERALS CH
1.0000 | ORAL_TABLET | Freq: Every day | ORAL | Status: DC
  Administered 2021-07-22 – 2021-07-24 (×3): 1 via ORAL
  Filled 2021-07-21 (×3): qty 1

## 2021-07-21 MED ORDER — INSULIN GLARGINE-YFGN 100 UNIT/ML ~~LOC~~ SOLN
30.0000 [IU] | Freq: Every day | SUBCUTANEOUS | Status: DC
  Administered 2021-07-21 – 2021-07-23 (×3): 30 [IU] via SUBCUTANEOUS
  Filled 2021-07-21 (×4): qty 0.3

## 2021-07-21 MED ORDER — FUROSEMIDE 10 MG/ML IJ SOLN
40.0000 mg | Freq: Two times a day (BID) | INTRAMUSCULAR | Status: DC
  Administered 2021-07-22: 40 mg via INTRAVENOUS
  Filled 2021-07-21: qty 4

## 2021-07-21 MED ORDER — LIDOCAINE VISCOUS HCL 2 % MT SOLN
15.0000 mL | Freq: Four times a day (QID) | OROMUCOSAL | Status: DC | PRN
  Administered 2021-07-21: 15 mL via ORAL
  Filled 2021-07-21 (×2): qty 15

## 2021-07-21 MED ORDER — FUROSEMIDE 10 MG/ML IJ SOLN
20.0000 mg | Freq: Once | INTRAMUSCULAR | Status: AC
  Administered 2021-07-22: 20 mg via INTRAVENOUS
  Filled 2021-07-21 (×2): qty 2

## 2021-07-21 MED ORDER — MORPHINE SULFATE (PF) 4 MG/ML IV SOLN
4.0000 mg | Freq: Once | INTRAVENOUS | Status: AC
Start: 2021-07-21 — End: 2021-07-21
  Administered 2021-07-21: 4 mg via INTRAVENOUS
  Filled 2021-07-21: qty 1

## 2021-07-21 MED ORDER — ACETAMINOPHEN 325 MG PO TABS
650.0000 mg | ORAL_TABLET | ORAL | Status: DC | PRN
  Administered 2021-07-22: 650 mg via ORAL
  Filled 2021-07-21: qty 2

## 2021-07-21 MED ORDER — HEPARIN BOLUS VIA INFUSION
4000.0000 [IU] | Freq: Once | INTRAVENOUS | Status: AC
  Administered 2021-07-21: 4000 [IU] via INTRAVENOUS
  Filled 2021-07-21: qty 4000

## 2021-07-21 MED ORDER — ALUM & MAG HYDROXIDE-SIMETH 200-200-20 MG/5ML PO SUSP
30.0000 mL | Freq: Four times a day (QID) | ORAL | Status: DC | PRN
  Filled 2021-07-21: qty 30

## 2021-07-21 MED ORDER — GABAPENTIN 300 MG PO CAPS
300.0000 mg | ORAL_CAPSULE | Freq: Three times a day (TID) | ORAL | Status: DC
  Administered 2021-07-21 – 2021-07-25 (×11): 300 mg via ORAL
  Filled 2021-07-21: qty 1
  Filled 2021-07-21: qty 3
  Filled 2021-07-21 (×6): qty 1
  Filled 2021-07-21: qty 3
  Filled 2021-07-21 (×2): qty 1

## 2021-07-21 MED ORDER — ALPRAZOLAM 0.25 MG PO TABS
0.2500 mg | ORAL_TABLET | Freq: Two times a day (BID) | ORAL | Status: DC | PRN
  Administered 2021-07-22: 0.25 mg via ORAL
  Filled 2021-07-21: qty 1

## 2021-07-21 MED ORDER — ATORVASTATIN CALCIUM 10 MG PO TABS
20.0000 mg | ORAL_TABLET | Freq: Every day | ORAL | Status: DC
  Administered 2021-07-22 – 2021-07-24 (×3): 20 mg via ORAL
  Filled 2021-07-21 (×3): qty 2

## 2021-07-21 MED ORDER — FUROSEMIDE 10 MG/ML IJ SOLN: 40.0000 mg | Freq: Two times a day (BID) | INTRAMUSCULAR | Status: DC

## 2021-07-21 MED ORDER — SODIUM CHLORIDE 0.9 % IV SOLN: INTRAVENOUS | Status: DC

## 2021-07-21 MED ORDER — INSULIN ASPART 100 UNIT/ML IJ SOLN
0.0000 [IU] | Freq: Three times a day (TID) | INTRAMUSCULAR | Status: DC
  Administered 2021-07-22: 3 [IU] via SUBCUTANEOUS
  Administered 2021-07-22: 5 [IU] via SUBCUTANEOUS
  Administered 2021-07-23: 7 [IU] via SUBCUTANEOUS
  Administered 2021-07-23: 3 [IU] via SUBCUTANEOUS
  Administered 2021-07-23: 5 [IU] via SUBCUTANEOUS
  Administered 2021-07-24: 7 [IU] via SUBCUTANEOUS
  Administered 2021-07-24: 5 [IU] via SUBCUTANEOUS
  Administered 2021-07-24: 3 [IU] via SUBCUTANEOUS
  Administered 2021-07-25: 5 [IU] via SUBCUTANEOUS
  Filled 2021-07-21: qty 0.09

## 2021-07-21 MED ORDER — NITROGLYCERIN 0.4 MG SL SUBL: 0.4000 mg | SUBLINGUAL_TABLET | SUBLINGUAL | Status: DC | PRN

## 2021-07-21 MED ORDER — SODIUM CHLORIDE 0.9 % IV BOLUS
1000.0000 mL | Freq: Once | INTRAVENOUS | Status: AC
  Administered 2021-07-21: 1000 mL via INTRAVENOUS

## 2021-07-21 MED ORDER — DILTIAZEM HCL ER COATED BEADS 120 MG PO CP24
120.0000 mg | ORAL_CAPSULE | Freq: Every day | ORAL | Status: DC
  Administered 2021-07-22 – 2021-07-25 (×4): 120 mg via ORAL
  Filled 2021-07-21 (×4): qty 1

## 2021-07-21 MED ORDER — LEVOTHYROXINE SODIUM 25 MCG PO TABS
137.0000 ug | ORAL_TABLET | Freq: Every day | ORAL | Status: DC
  Administered 2021-07-22 – 2021-07-25 (×4): 137 ug via ORAL
  Filled 2021-07-21 (×4): qty 1

## 2021-07-21 MED ORDER — VITAMIN B-12 1000 MCG PO TABS
1000.0000 ug | ORAL_TABLET | Freq: Every day | ORAL | Status: DC
  Administered 2021-07-22 – 2021-07-25 (×4): 1000 ug via ORAL
  Filled 2021-07-21 (×4): qty 1

## 2021-07-21 MED ORDER — CARVEDILOL 25 MG PO TABS
25.0000 mg | ORAL_TABLET | Freq: Two times a day (BID) | ORAL | Status: DC
  Administered 2021-07-21 – 2021-07-25 (×8): 25 mg via ORAL
  Filled 2021-07-21: qty 1
  Filled 2021-07-21: qty 2
  Filled 2021-07-21 (×7): qty 1

## 2021-07-21 MED ORDER — ALUM & MAG HYDROXIDE-SIMETH 200-200-20 MG/5ML PO SUSP
30.0000 mL | Freq: Once | ORAL | Status: AC
  Administered 2021-07-21: 30 mL via ORAL
  Filled 2021-07-21: qty 30

## 2021-07-21 MED ORDER — ASPIRIN 300 MG RE SUPP: 300.0000 mg | RECTAL | Status: AC

## 2021-07-21 MED ORDER — ONDANSETRON HCL 4 MG/2ML IJ SOLN
4.0000 mg | Freq: Four times a day (QID) | INTRAMUSCULAR | Status: DC | PRN
  Administered 2021-07-21: 4 mg via INTRAVENOUS
  Filled 2021-07-21: qty 2

## 2021-07-21 MED ORDER — FERROUS SULFATE 325 (65 FE) MG PO TABS
650.0000 mg | ORAL_TABLET | Freq: Every day | ORAL | Status: DC
  Administered 2021-07-21 – 2021-07-24 (×4): 650 mg via ORAL
  Filled 2021-07-21 (×4): qty 2

## 2021-07-21 MED ORDER — PANTOPRAZOLE SODIUM 40 MG PO TBEC
40.0000 mg | DELAYED_RELEASE_TABLET | Freq: Every day | ORAL | Status: DC
  Administered 2021-07-22 – 2021-07-25 (×4): 40 mg via ORAL
  Filled 2021-07-21 (×5): qty 1

## 2021-07-21 MED ORDER — OMEGA-3-ACID ETHYL ESTERS 1 G PO CAPS
2000.0000 mg | ORAL_CAPSULE | Freq: Every day | ORAL | Status: DC
  Administered 2021-07-21 – 2021-07-24 (×4): 2000 mg via ORAL
  Filled 2021-07-21 (×5): qty 2

## 2021-07-21 MED ORDER — ALBUMIN HUMAN 25 % IV SOLN
25.0000 g | Freq: Once | INTRAVENOUS | Status: AC
  Administered 2021-07-21: 25 g via INTRAVENOUS
  Filled 2021-07-21 (×2): qty 100

## 2021-07-21 MED ORDER — ASPIRIN EC 81 MG PO TBEC
81.0000 mg | DELAYED_RELEASE_TABLET | Freq: Every day | ORAL | Status: DC
  Administered 2021-07-22 – 2021-07-25 (×4): 81 mg via ORAL
  Filled 2021-07-21 (×4): qty 1

## 2021-07-21 MED ORDER — LIDOCAINE VISCOUS HCL 2 % MT SOLN
15.0000 mL | Freq: Once | OROMUCOSAL | Status: AC
  Administered 2021-07-21: 15 mL via ORAL
  Filled 2021-07-21: qty 15

## 2021-07-21 MED ORDER — HYDRALAZINE HCL 50 MG PO TABS
100.0000 mg | ORAL_TABLET | Freq: Three times a day (TID) | ORAL | Status: DC
  Administered 2021-07-21 – 2021-07-23 (×5): 100 mg via ORAL
  Filled 2021-07-21 (×6): qty 2

## 2021-07-21 MED ORDER — ONDANSETRON HCL 4 MG/2ML IJ SOLN: 4.0000 mg | Freq: Once | INTRAMUSCULAR | Status: DC

## 2021-07-21 MED ORDER — CLONIDINE HCL 0.2 MG PO TABS
0.2000 mg | ORAL_TABLET | Freq: Every evening | ORAL | Status: DC
  Administered 2021-07-21 – 2021-07-24 (×4): 0.2 mg via ORAL
  Filled 2021-07-21 (×5): qty 1

## 2021-07-21 MED ORDER — INSULIN ASPART 100 UNIT/ML IJ SOLN
10.0000 [IU] | Freq: Once | INTRAMUSCULAR | Status: AC
  Administered 2021-07-21: 10 [IU] via SUBCUTANEOUS

## 2021-07-21 NOTE — Progress Notes (Signed)
ANTICOAGULATION CONSULT NOTE - Initial Consult  Pharmacy Consult for IV heparin Indication: chest pain/ACS  No Known Allergies  Patient Measurements: Height: 5\' 7"  (170.2 cm) Weight: 73 kg (161 lb) IBW/kg (Calculated) : 66.1 Heparin Dosing Weight: 73 kg   Vital Signs: Temp: 98.7 F (37.1 C) (10/28 0903) Temp Source: Oral (10/28 0903) BP: 170/86 (10/28 1350) Pulse Rate: 97 (10/28 1351)  Labs: Recent Labs    07/21/21 1125 07/21/21 1139  HGB  --  10.2*  HCT  --  28.6*  PLT  --  246  CREATININE  --  2.15*  TROPONINIHS 750* 822*    Estimated Creatinine Clearance: 33.7 mL/min (A) (by C-G formula based on SCr of 2.15 mg/dL (H)).   Medical History: Past Medical History:  Diagnosis Date   Anemia    CKD (chronic kidney disease) stage 3, GFR 30-59 ml/min (HCC)    Hypertension    Idiopathic progressive neuropathy    Secondary hyperparathyroidism, renal (HCC)    Type 2 diabetes mellitus (HCC)     Medications:  Scheduled:   ondansetron (ZOFRAN) IV  4 mg Intravenous Once    Assessment: Pharmacy is consulted to dose heparin in 60 yo male diagnosed with ACS.  No noted anticoagulation PTA   Today, 07/21/21  Hgb 10.2, plt is 246  Scr 2.15 mg/dl, CrCl 34 ml/min     Goal of Therapy:  Heparin level 0.3-0.7 units/ml Monitor platelets by anticoagulation protocol: Yes   Plan:  Heparin 4000 unit bolus followed by 900 units/hr Obtain HL 8 hours after start of infusion  Daily CBC  Monitor for signs and symptoms of bleeding  Royetta Asal, PharmD, BCPS 07/21/2021 3:56 PM

## 2021-07-21 NOTE — ED Triage Notes (Signed)
Per EMS- Patient c/o nausea, chills, and burning from the waist to his head x 3 days.

## 2021-07-21 NOTE — Consult Note (Addendum)
CARDIOLOGY CONSULT NOTE  Patient ID: Delance Weide MRN: [id number] DOB/AGE: May 20, 1961 60 y.o.  Admit date: 07/21/2021 Referring Physician: Dirk Dress ED Primary Physician:  Dr. Isidore Moos Reason for Consultation:  Elevated troponin  HPI:   60 y.o. [name] male with labile hypertension, CKD 3, idiopathic progressive neuropathy, orthostatic hypotension/ supine hypertension, hyponatremia, admitted with nausea, vomiting. Cardiology consulted for elevated troponin  Patient was recently hospitalized in 06/2021 for progressive generalized weakness/malaise, loss of appetite and nausea with 2 episodes of nonbloody nonbilious emesis. He was treated for hypertensive urgency as well as well orthostatic hypotension, hyponatremia. [name] cortisol was normal. He sees nephrologist Dr. Elmarie Shiley. His lasix was held after his recent hospitalization, after which he gained 10 lbs and developed leg swelling. Lasix was thus resumed, but he had Na reduced further. Therefore, it was changed to every other day.   Patient now presented with nausea, vomiting and generalized burning sensation. While in the ED, he had pleuritic chest pain. Workup in the ED showed severe hyponatremia at 117, nonspecific EKG, HS trop mildly elevated at 750-->822 (checked 14 min apart). His BNP has recently been elevated up to 530.   Past Medical History:  Diagnosis Date   Anemia    CKD (chronic kidney disease) stage 3, GFR 30-59 ml/min (HCC)    Hypertension    Idiopathic progressive neuropathy    Secondary hyperparathyroidism, renal (HCC)    Type 2 diabetes mellitus ([place])      Past Surgical History:  Procedure Laterality Date   GIVENS CAPSULE STUDY N/A 09/01/2020   Procedure: GIVENS CAPSULE STUDY;  Surgeon: Juanita Craver, MD;  Location: [facility] ENDOSCOPY;  Service: Endoscopy;  Laterality: N/A;      Family History  Problem Relation Age of Onset   Stroke Mother    Hypertension Mother    Diabetes Father      Social History: Social  History   Socioeconomic History   Marital status: Married    Spouse name: Not on file   Number of children: 0   Years of education: Not on file   Highest education level: Not on file  Occupational History   Not on file  Tobacco Use   Smoking status: Never   Smokeless tobacco: Never  Vaping Use   Vaping Use: Never used  Substance and Sexual Activity   Alcohol use: Never   Drug use: Never   Sexual activity: Not on file  Other Topics Concern   Not on file  Social History Narrative   Not on file   Social Determinants of Health   Financial Resource Strain: Not on file  Food Insecurity: Not on file  Transportation Needs: Not on file  Physical Activity: Not on file  Stress: Not on file  Social Connections: Not on file  Intimate Partner Violence: Not on file     (Not in a hospital admission)   Review of Systems  Constitutional: Negative for decreased appetite, malaise/fatigue, weight gain and weight loss.  HENT:  Negative for congestion.   Eyes:  Negative for visual disturbance.  Cardiovascular:  Positive for chest pain and leg swelling. Negative for dyspnea on exertion, palpitations and syncope.  Respiratory:  Negative for cough.   Endocrine: Negative for cold intolerance.  Hematologic/Lymphatic: Does not bruise/bleed easily.  Skin:  Negative for itching and rash.  Musculoskeletal:  Negative for myalgias.  Gastrointestinal:  Positive for nausea and vomiting. Negative for abdominal pain.  Genitourinary:  Negative for dysuria.  Neurological:  Negative for dizziness and weakness.  Psychiatric/Behavioral:  The patient is not nervous/anxious.   All other systems reviewed and are negative.    Physical Exam: Physical Exam Vitals and nursing note reviewed.  Constitutional:      General: He is not in acute distress.    Appearance: He is well-developed. He is ill-appearing.  HENT:     Head: Normocephalic and atraumatic.  Eyes:     Conjunctiva/sclera: Conjunctivae  normal.     Pupils: Pupils are equal, round, and reactive to light.  Neck:     Vascular: JVD present.  Cardiovascular:     Rate and Rhythm: Normal rate and regular rhythm.     Pulses: Normal pulses and intact distal pulses.     Heart sounds: No murmur heard. Pulmonary:     Effort: Pulmonary effort is normal.     Breath sounds: Normal breath sounds. No wheezing or rales.  Abdominal:     General: Bowel sounds are normal.     Palpations: Abdomen is soft.     Tenderness: There is no rebound.  Musculoskeletal:        General: No tenderness. Normal range of motion.     Right lower leg: Edema (2+) present.     Left lower leg: Edema (2+) present.  Lymphadenopathy:     Cervical: No cervical adenopathy.  Skin:    General: Skin is warm and dry.  Neurological:     Mental Status: He is alert and oriented to person, place, and time.     Cranial Nerves: No cranial nerve deficit.     Labs:   Lab Results  Component Value Date   WBC 9.2 07/21/2021   HGB 10.2 (L) 07/21/2021   HCT 28.6 (L) 07/21/2021   MCV 86.7 07/21/2021   PLT 246 07/21/2021    Recent Labs  Lab 07/21/21 1139  NA 117*  K 3.8  CL 87*  CO2 20*  BUN 29*  CREATININE 2.15*  CALCIUM 9.2  PROT 6.2*  BILITOT 1.4*  ALKPHOS 55  ALT 23  AST 34  GLUCOSE 296*     BNP (last 3 results) Recent Labs    06/28/21 1727 06/29/21 1521  BNP 429.3* 530.5*    HEMOGLOBIN A1C Lab Results  Component Value Date   HGBA1C 6.9 (H) 06/29/2021   MPG 151.33 06/29/2021    Cardiac Panel (last 3 results) Results for ARCHIBALD, MARCHETTA (MRN [id number]) as of 07/21/2021 16:44  Ref. Range 07/21/2021 11:25 07/21/2021 11:39 07/21/2021 15:17  Troponin I (High Sensitivity) Latest Ref Range: <18 ng/L 750 (HH) 822 (HH) 437 (HH)    TSH Recent Labs    06/30/21 0511  TSH 4.407      Radiology: DG Chest 2 View  Result Date: 07/21/2021 CLINICAL DATA:  Shortness of breath, dizziness, and chills for 3 days. EXAM: CHEST - 2 VIEW  COMPARISON:  None. FINDINGS: The heart size and mediastinal contours are within normal limits. Mild pulmonary vascular congestion. Trace bilateral pleural effusions. No consolidation or pneumothorax. No acute osseous abnormality. IMPRESSION: 1. Mild pulmonary vascular congestion with trace bilateral pleural effusions. Electronically Signed   By: Titus Dubin M.D.   On: 07/21/2021 09:52    Scheduled Meds:  Continuous Infusions:  heparin 900 Units/hr (07/21/21 1428)   PRN Meds:.  CARDIAC STUDIES:  EKG 07/21/2021: Sinus rhythm 93 bpm Possible old anteroseptal infarct Nonspecific ST-T changes  Echocardiogram 03/03/2020:  Normal LVEF Mild left atrial enlargement Trace MR, TR. RVSP 23 mmHg      Assessment & Recommendations:  60  y.o. [name] male with labile hypertension, CKD 3, idiopathic progressive neuropathy, orthostatic hypotension/ supine hypertension, hyponatremia, admitted with nausea, vomiting. Cardiology consulted for elevated troponin  Elevated troponin: Mildly elevated HS troponin, now trending down.  Initial presentation with nausea, vomiting, followed by pleuritic chest pain. He has risk factors for CAD with hypertension, type 2 DM. He will likely need ischemic workup at some point. Recommend Aspirin, heparin, statin for now. Continue beta blocker.  Obtain echocardiogram. Ideally, would need ischemic workup. I would prefer Cr to improve before any ischemic workup. If Cr remains elevated, could consider stress testing.  If EF preserved, could perform outpatient ischemic workup.  HFpEF: Previously known to have normal EF. Current presentation is either acute on chronic HFpEF, or new diagnosis HFrEF. He has bilateral leg edema and JVD.  Unless nephrology strongly recommends otherwise, I recommend diuresis with IV lasix 40 mg bid.  Hyponatremia: Could be dilutional due to heart failure. Unless nephrology strongly recommends otherwise, I recommend diuresis with  IV lasix 40 mg bid, with close monitoring of Na. Will look for nephrology recommendations.   Will continue to follow the patient.     Nigel Mormon, MD Pager: [phone] Office: [phone]

## 2021-07-21 NOTE — Progress Notes (Addendum)
Pt concerned about 40mg  IV lasix dose given hyponatremia and recent hospital stay earlier this month with hyponatremia attributed (at least in part) due to lasix.  Requests a lower dose.  At patients request, will go ahead and make tonight's dose 20mg  IV instead and see how sodium responds first (increasing to 40mg  IV BID in AM if good response).  Addendum: now pt apparently refusing even lasix 20mg .  Will go ahead and start 1221ml / day fluid restriction.  Discussed with Dr. Marval Regal.  Addendum 2: pt now willing to take the 20mg  IV lasix.

## 2021-07-21 NOTE — ED Provider Notes (Signed)
[facility] DEPT Provider Note   CSN: [id number] Arrival date & time: 07/21/21  4098     History Chief Complaint  Patient presents with   Nausea   Dizziness   Chills   Emesis   I initially saw this patient in  triage, however he was emergently transferred from the waiting room to the main emergency department room 19 when critical lab results revealed hyponatremia to 117 and troponin elevated at 750.  Delta troponin 822.  Tim Spencer is a 60 y.o. male with history of chronic hyponatremia who presents with concern for 3 days of nausea, vomiting, and burning sensation in his abdomen Likely secondary to overusage of his Lasix.  At that time he was discharged with instructions to stop his Lasix head.  Patient was recently admitted to the hospital for hyponatremia and hypovolemia.  Patient's diuretics were held throughout his hospitalization.  Additionally hypertensive urgency.  He presents today with these recurrent symptoms but without chest pain or shortness of breath.  Patient is exceedingly anxious requesting "medicine for anxiety or something to make me go to sleep so I do not feel so anxious".  I personally reviewed this patient's medical records.  He has history of intermittent hyponatremia as well as hyperglycemia.  History of insulin-dependent type 2 diabetes, stage III CKD, and hypertensive urgency.  Patient states he continues to take Lasix at this time.  Per chart review he was discharged on 10/8 with instructions to hold his Lasix; but it appears that he has new prescription for 20 mg of Lasix 4 times a day on 10/17.  HPI     Past Medical History:  Diagnosis Date   Anemia    CKD (chronic kidney disease) stage 3, GFR 30-59 ml/min (HCC)    Hypertension    Idiopathic progressive neuropathy    Secondary hyperparathyroidism, renal (HCC)    Type 2 diabetes mellitus ([place])     Patient Active Problem List   Diagnosis Date Noted   Hypertensive  urgency 06/28/2021   Chronic kidney disease, stage 3a ([place])    Insulin-requiring or dependent type II diabetes mellitus ([place])    Hypothyroidism    Hyponatremia 07/05/2020   Orthostatic hypotension 06/06/2020   Bruit of right carotid artery 06/06/2020    Past Surgical History:  Procedure Laterality Date   GIVENS CAPSULE STUDY N/A 09/01/2020   Procedure: GIVENS CAPSULE STUDY;  Surgeon: Juanita Craver, MD;  Location: [facility] ENDOSCOPY;  Service: Endoscopy;  Laterality: N/A;     Family History  Problem Relation Age of Onset   Stroke Mother    Hypertension Mother    Diabetes Father     Social History   Tobacco Use   Smoking status: Never   Smokeless tobacco: Never  Vaping Use   Vaping Use: Never used  Substance Use Topics   Alcohol use: Never   Drug use: Never    Home Medications Prior to Admission medications   Medication Sig Start Date End Date Taking? Authorizing Provider  atorvastatin (LIPITOR) 20 MG tablet Take 20 mg by mouth daily after lunch.  04/12/20  Yes [provider]  carvedilol (COREG) 25 MG tablet Take 1 tablet (25 mg total) by mouth 2 (two) times daily with a meal. 07/01/21  Yes Nicole Kindred A, DO  cloNIDine (CATAPRES) 0.2 MG tablet Take 1 tablet (0.2 mg total) by mouth every evening. 07/01/21  Yes Nicole Kindred A, DO  diltiazem (CARDIZEM CD) 120 MG 24 hr capsule Take 1 capsule (120  mg total) by mouth daily. 07/02/21  Yes Nicole Kindred A, DO  EUTHYROX 137 MCG tablet Take 137 mcg by mouth daily. 06/05/21  Yes [provider]  ferrous sulfate 325 (65 FE) MG tablet Take 650 mg by mouth daily after supper.    Yes [provider]  furosemide (LASIX) 20 MG tablet Take 20 mg by mouth 2 (two) times daily. 07/10/21  Yes [provider]  gabapentin (NEURONTIN) 300 MG capsule Take 300 mg by mouth 3 (three) times daily. 01/26/21  Yes [provider]  HUMALOG KWIKPEN 100 UNIT/ML KwikPen Inject 10-15 Units into the skin 3 (three) times  daily before meals. Sliding Scale Insulin 05/09/20  Yes [provider]  hydrALAZINE (APRESOLINE) 100 MG tablet Take 1 tablet (100 mg total) by mouth 3 (three) times daily. 07/01/21  Yes Nicole Kindred A, DO  Iron-FA-B Cmp-C-Biot-Probiotic (FUSION PLUS) CAPS Take 1 capsule by mouth daily. 07/01/21  Yes [provider]  magnesium oxide (MAG-OX) 400 MG tablet Take 400 mg by mouth daily after lunch.   Yes [provider]  Multiple Vitamin (MULTIVITAMIN WITH MINERALS) TABS tablet Take 1 tablet by mouth daily after lunch.   Yes [provider]  Omega-3 1000 MG CAPS Take 2,000 mg by mouth at bedtime.    Yes [provider]  TOUJEO SOLOSTAR 300 UNIT/ML Solostar Pen Inject 30 Units into the skin daily. 30 05/08/20  Yes [provider]  vitamin B-12 (CYANOCOBALAMIN) 1000 MCG tablet Take 1,000 mcg by mouth daily.   Yes [provider]  Continuous Blood Gluc Sensor (FREESTYLE LIBRE 2 SENSOR) MISC Inject into the skin every 14 (fourteen) days. 05/08/20   [provider]    Allergies    Patient has no known allergies.  Review of Systems   Review of Systems  Constitutional:  Positive for appetite change, chills and fatigue. Negative for activity change, diaphoresis and fever.  HENT: Negative.    Respiratory:  Positive for shortness of breath.   Cardiovascular:  Positive for chest pain.  Gastrointestinal:  Positive for abdominal pain, nausea and vomiting. Negative for diarrhea.  Musculoskeletal: Negative.   Skin: Negative.   Neurological: Negative.    Physical Exam Updated Vital Signs BP 125/66   Pulse 92   Temp 98.7 F (37.1 C) (Oral)   Resp 11   Ht 5\' 7"  (1.702 m)   Wt 73 kg   SpO2 97%   BMI 25.22 kg/m   Physical Exam Vitals and nursing note reviewed.  Constitutional:      Appearance: He is obese. He is not ill-appearing or toxic-appearing.  HENT:     Head: Normocephalic and atraumatic.     Nose: Nose normal. No  congestion.     Mouth/Throat:     Mouth: Mucous membranes are moist.     Pharynx: Oropharynx is clear. Uvula midline. No oropharyngeal exudate or posterior oropharyngeal erythema.     Tonsils: No tonsillar exudate.  Eyes:     General: Lids are normal. Vision grossly intact.        Right eye: No discharge.        Left eye: No discharge.     Extraocular Movements: Extraocular movements intact.     Conjunctiva/sclera: Conjunctivae normal.     Pupils: Pupils are equal, round, and reactive to light.  Neck:     Trachea: Trachea and phonation normal.  Cardiovascular:     Rate and Rhythm: Normal rate and regular rhythm.     Pulses:  Normal pulses.     Heart sounds: Normal heart sounds. No murmur heard. Pulmonary:     Effort: Pulmonary effort is normal. No tachypnea, bradypnea, accessory muscle usage, prolonged expiration or respiratory distress.     Breath sounds: Normal breath sounds. No wheezing or rales.  Chest:     Chest wall: No mass, lacerations, deformity, swelling, tenderness, crepitus or edema.  Abdominal:     General: Bowel sounds are normal. There is no distension.     Palpations: Abdomen is soft.     Tenderness: There is no abdominal tenderness. There is no right CVA tenderness, left CVA tenderness, guarding or rebound.  Musculoskeletal:        General: No deformity.     Cervical back: Normal range of motion and neck supple. No edema, rigidity, tenderness or crepitus. No pain with movement, spinous process tenderness or muscular tenderness.     Right lower leg: No edema.     Left lower leg: No edema.  Lymphadenopathy:     Cervical: No cervical adenopathy.  Skin:    General: Skin is warm and dry.     Capillary Refill: Capillary refill takes less than 2 seconds.     Findings: No rash.  Neurological:     General: No focal deficit present.     Mental Status: He is alert and oriented to person, place, and time. Mental status is at baseline.     GCS: GCS eye subscore is 4. GCS  verbal subscore is 5. GCS motor subscore is 6.     Cranial Nerves: Cranial nerves 2-12 are intact.     Sensory: Sensation is intact.     Motor: Motor function is intact.     Gait: Gait normal.  Psychiatric:        Mood and Affect: Mood is anxious.    ED Results / Procedures / Treatments   Labs (all labs ordered are listed, but only abnormal results are displayed) Labs Reviewed  COMPREHENSIVE METABOLIC PANEL - Abnormal; Notable for the following components:      Result Value   Sodium 117 (*)    Chloride 87 (*)    CO2 20 (*)    Glucose, Bld 296 (*)    BUN 29 (*)    Creatinine, Ser 2.15 (*)    Total Protein 6.2 (*)    Albumin 3.1 (*)    Total Bilirubin 1.4 (*)    GFR, Estimated 34 (*)    All other components within normal limits  CBC WITH DIFFERENTIAL/PLATELET - Abnormal; Notable for the following components:   RBC 3.30 (*)    Hemoglobin 10.2 (*)    HCT 28.6 (*)    All other components within normal limits  CBG MONITORING, ED - Abnormal; Notable for the following components:   Glucose-Capillary 243 (*)    All other components within normal limits  TROPONIN I (HIGH SENSITIVITY) - Abnormal; Notable for the following components:   Troponin I (High Sensitivity) 822 (*)    All other components within normal limits  TROPONIN I (HIGH SENSITIVITY) - Abnormal; Notable for the following components:   Troponin I (High Sensitivity) 750 (*)    All other components within normal limits  MAGNESIUM  LIPASE, BLOOD  URINALYSIS, ROUTINE W REFLEX MICROSCOPIC    EKG EKG Interpretation  Date/Time:  Friday July 21 2021 14:21:43 EDT Ventricular Rate:  93 PR Interval:    QRS Duration: 105 QT Interval:  357 QTC Calculation: 444 R Axis:   92 Text  Interpretation: Poor Baseline Right axis deviation Repol abnrm suggests ischemia, diffuse leads Confirmed by Dene Gentry [phone]) on 07/21/2021 2:26:04 PM  Radiology DG Chest 2 View  Result Date: 07/21/2021 CLINICAL DATA:  Shortness of  breath, dizziness, and chills for 3 days. EXAM: CHEST - 2 VIEW COMPARISON:  None. FINDINGS: The heart size and mediastinal contours are within normal limits. Mild pulmonary vascular congestion. Trace bilateral pleural effusions. No consolidation or pneumothorax. No acute osseous abnormality. IMPRESSION: 1. Mild pulmonary vascular congestion with trace bilateral pleural effusions. Electronically Signed   By: Titus Dubin M.D.   On: 07/21/2021 09:52    Procedures .Critical Care Performed by: Emeline Darling, PA-C Authorized by: Emeline Darling, PA-C   Critical care provider statement:    Critical care time (minutes):  45   Critical care was necessary to treat or prevent imminent or life-threatening deterioration of the following conditions: NSTEMI.   Critical care was time spent personally by me on the following activities:  Development of treatment plan with patient or surrogate, discussions with consultants, evaluation of patient's response to treatment, examination of patient, obtaining history from patient or surrogate, ordering and performing treatments and interventions, ordering and review of laboratory studies, ordering and review of radiographic studies, pulse oximetry and re-evaluation of patient's condition   Medications Ordered in ED Medications  heparin ADULT infusion 100 units/mL (25000 units/286mL) (900 Units/hr Intravenous New Bag/Given 07/21/21 1428)  morphine 4 MG/ML injection 4 mg (4 mg Intravenous Given 07/21/21 1403)  LORazepam (ATIVAN) injection 0.5 mg (0.5 mg Intravenous Given 07/21/21 1403)  ondansetron (ZOFRAN) injection 4 mg (4 mg Intravenous Given 07/21/21 1408)  sodium chloride 0.9 % bolus 1,000 mL (1,000 mLs Intravenous New Bag/Given (Non-Interop) 07/21/21 1407)  heparin bolus via infusion 4,000 Units (4,000 Units Intravenous Bolus from Bag 07/21/21 1429)    ED Course  I have reviewed the triage vital signs and the nursing notes.  Pertinent labs &  imaging results that were available during my care of the patient were reviewed by me and considered in my medical decision making (see chart for details).  Clinical Course as of 07/21/21 1547  Fri Jul 21, 2021  1359 [facility] cardiology paged to my phone for NSTEMI. [RS]  [place] cardiology repaged.  [RS]  1510 Consult call from team and cardiologist Dr. Terri Skains who recommends trending troponins until they level off, repeating EKG, and agrees with plan for heparin.  [facility] cardiology will see this patient in consultation on the inpatient service.  Agrees with plan to admit to medicine. [RS]  1545 Consult to hospitalist who is agreeable to seeing this patient admitting this patient to step-down unit. I appreciate his collaboration in the care of this patient.  [RS]    Clinical Course User Index [RS] Emilyn Ruble, Sharlene Dory   MDM Rules/Calculators/A&P                         60 year old male who presents with concern for abdominal pain, N, chills, and anxiety.   Hypertensive on intake to 182/83. Cardiac exam is normal, pulmonary exam with mild rales in the lung bases and tachypnea.  Abdominal exam is generally tender without focal area of TTP, rebound, or guarding. 2+ pitting edema in the lower extremities bilaterally.  Patient is very anxious.  CBC with mild anemia with hemoglobin of 10.2 near his baseline. CMP with critical hyponatremia of 117, however when corrected for his hyperglycemia sodium is 122. Creatinine  increased to 2.1 from baseline near 1.5.  GFR dropped to 34 and BUN is elevated to 29.Troponin critically elevated to 750.  Delta troponin elevated to 822. Mag normal, lipase is normal.   Heparin and NS bolus ordered.   Patient administered Ativan, Zofran, and morphine for pain.  Very calm at this time.  Consult to Dr. Terri Skains, cardiologist as above.  Patient will be consulted on the inpatient service.  Consult to hospitalist above as well.  Shone voiced understanding of  his medical evaluation and treatment plan thus far.  Each of his questions was answered to his expressed satisfaction.  He is amenable to plan for admission at this time.   This chart was dictated using voice recognition software, Dragon. Despite the best efforts of this provider to proofread and correct errors, errors may still occur which can change documentation meaning.  Final Clinical Impression(s) / ED Diagnoses Final diagnoses:  None    Rx / DC Orders ED Discharge Orders     None        Aura Dials 07/21/21 1550    Valarie Merino, MD 07/23/21 [phone]

## 2021-07-21 NOTE — ED Provider Notes (Signed)
Emergency Medicine Provider Triage Evaluation Note  Tim Spencer , a 60 y.o. male  was evaluated in triage.  Pt complains of nausea, vomiting, and burning in his chest on his head for the last 3 days.  Patient is an insulin-dependent diabetic; per EMS CBG 100 but patient did take his insulin today.  Endorses tremors and generally feeling weak. He was admitted 3 weeks ago for hyponatremia.  Review of Systems  Positive: Weakness, nausea, vomiting, burning pain in his abdomen, chest, and head Negative: Blurry or double vision, syncope, palpitations  Physical Exam  BP (!) 182/83   Pulse 76   Temp 98.7 F (37.1 C) (Oral)   Resp 18   SpO2 100%  Gen:   Awake, no distress   Resp:  Normal effort  MSK:   Moves extremities without difficulty  Other:  Tremulous.  RRR no M/R/G.  Lungs CTA B.  Abdomen soft, nondistended, nontender.  Medical Decision Making  Medically screening exam initiated at 9:25 AM.  Appropriate orders placed.  Hilmar Landin was informed that the remainder of the evaluation will be completed by another provider, this initial triage assessment does not replace that evaluation, and the importance of remaining in the ED until their evaluation is complete.  This chart was dictated using voice recognition software, Dragon. Despite the best efforts of this provider to proofread and correct errors, errors may still occur which can change documentation meaning.    Emeline Darling, PA-C 07/21/21 1561    Valarie Merino, MD 07/21/21 1330

## 2021-07-21 NOTE — Progress Notes (Signed)
Pt received from [facility] ED. VSS. Telemetry applied. Heparin gtt infusion 32ml/hr. Pt oriented to room and unit. Call light in reach.  Clyde Canterbury, RN

## 2021-07-21 NOTE — Progress Notes (Addendum)
   07/21/21 2227  Notify: Provider  Provider Name/Title Alcario Drought MD  Date Provider Notified 07/21/21  Time Provider Notified 2228  Notification Type Page  Notification Reason Critical result (Sodium level 118)  Patient resting quietly in the bed ,no sign of distress noted,vital signs remain stable. Zofran 4mg  given for nausea, Patient refused scheduled lasix 20mg  due to concerns about his low sodium level. Patient educated about the need to take lasix, he stated that he will only take lasix when his sodium level is improved.  Alcario Drought MD notified, will continue to monitor.

## 2021-07-21 NOTE — H&P (Addendum)
History and Physical    Tim Spencer MVH:[id number] DOB: 1961-07-29 DOA: 07/21/2021  PCP: Lin Landsman, MD/cardiology: Dr. Patwardhan/nephrology: Dr. Elmarie Shiley  Patient coming from: Home  I have personally briefly reviewed patient's old medical records in [place]  Chief Complaint: Chest pain  HPI: Tim Spencer is a 60 y.o. male with medical history significant of recent hospitalization 06/28/2021-07/01/2021 for hyponatremia felt secondary to Lasix, history of diabetes mellitus, CKD stage IIIa, hypertension, anemia, hypertensive urgency, anxiety presents to the ED with 2-day history of burning sensation in his chest and abdomen, generalized weakness, dizziness, paresis, some swelling, some nausea.  Patient does endorse an episode of emesis.  Patient states when he presented to the ED started to have some chest pain/pressure which she cannot quite describe.  Patient denies any fever, no chills, no syncope, no melena, no hematemesis, no hematochezia.  Patient does endorse a chronic diarrhea that he uses a tapwater enema every other day.  Patient states during recent hospitalization on discharge he was told if he was to have significant weight gain secondary to fluid retention he needed to take Lasix 20 mg twice daily q. OD which he had been doing.    ED Course: Patient seen in the ED initially noted to have a hypertensive urgency, noted to be anxious.  CBC done with a hemoglobin of 10.2 otherwise within normal limits.  Comprehensive metabolic profile done with a sodium of 117, chloride of 87, bicarb 20, glucose 296, BUN 29, creatinine 2.15, magnesium 1.9, albumin 3.1, protein of 6.2, bilirubin of 1.4 otherwise was within normal limits.  High-sensitivity troponin noted to be elevated at 750 with repeat at 822.  Urinalysis ordered and pending.  COVID-19 PCR ordered and pending.  Chest x-ray done with mild pulmonary vascular congestion with trace bilateral pleural effusions.  EKG with prolonged PR  interval.   Review of Systems: As per HPI otherwise all other systems reviewed and are negative.  Past Medical History:  Diagnosis Date   Anemia    CKD (chronic kidney disease) stage 3, GFR 30-59 ml/min (HCC)    Hypertension    Idiopathic progressive neuropathy    Secondary hyperparathyroidism, renal (HCC)    Type 2 diabetes mellitus (Tim Spencer)     Past Surgical History:  Procedure Laterality Date   GIVENS CAPSULE STUDY N/A 09/01/2020   Procedure: GIVENS CAPSULE STUDY;  Surgeon: Juanita Craver, MD;  Location: [facility] ENDOSCOPY;  Service: Endoscopy;  Laterality: N/A;    Social History  reports that he has never smoked. He has never used smokeless tobacco. He reports that he does not drink alcohol and does not use drugs.  No Known Allergies  Family History  Problem Relation Age of Onset   Stroke Mother    Hypertension Mother    Diabetes Father    Mother deceased in her 55s history of CVA and hypertension.  Father deceased age 68 from pneumonia per patient.  Father with history of hypertension.  Prior to Admission medications   Medication Sig Start Date End Date Taking? Authorizing Provider  atorvastatin (LIPITOR) 20 MG tablet Take 20 mg by mouth daily after lunch.  04/12/20  Yes [provider]  carvedilol (COREG) 25 MG tablet Take 1 tablet (25 mg total) by mouth 2 (two) times daily with a meal. 07/01/21  Yes Nicole Kindred A, DO  cloNIDine (CATAPRES) 0.2 MG tablet Take 1 tablet (0.2 mg total) by mouth every evening. 07/01/21  Yes Nicole Kindred A, DO  diltiazem (CARDIZEM CD) 120 MG  24 hr capsule Take 1 capsule (120 mg total) by mouth daily. 07/02/21  Yes Nicole Kindred A, DO  EUTHYROX 137 MCG tablet Take 137 mcg by mouth daily. 06/05/21  Yes [provider]  ferrous sulfate 325 (65 FE) MG tablet Take 650 mg by mouth daily after supper.    Yes [provider]  furosemide (LASIX) 20 MG tablet Take 20 mg by mouth 2 (two) times daily. 07/10/21  Yes [provider]  gabapentin (NEURONTIN) 300 MG capsule Take 300 mg by mouth 3 (three) times daily. 01/26/21  Yes [provider]  HUMALOG KWIKPEN 100 UNIT/ML KwikPen Inject 10-15 Units into the skin 3 (three) times daily before meals. Sliding Scale Insulin 05/09/20  Yes [provider]  hydrALAZINE (APRESOLINE) 100 MG tablet Take 1 tablet (100 mg total) by mouth 3 (three) times daily. 07/01/21  Yes Nicole Kindred A, DO  Iron-FA-B Cmp-C-Biot-Probiotic (FUSION PLUS) CAPS Take 1 capsule by mouth daily. 07/01/21  Yes [provider]  magnesium oxide (MAG-OX) 400 MG tablet Take 400 mg by mouth daily after lunch.   Yes [provider]  Multiple Vitamin (MULTIVITAMIN WITH MINERALS) TABS tablet Take 1 tablet by mouth daily after lunch.   Yes [provider]  Omega-3 1000 MG CAPS Take 2,000 mg by mouth at bedtime.    Yes [provider]  TOUJEO SOLOSTAR 300 UNIT/ML Solostar Pen Inject 30 Units into the skin daily. 30 05/08/20  Yes [provider]  vitamin B-12 (CYANOCOBALAMIN) 1000 MCG tablet Take 1,000 mcg by mouth daily.   Yes [provider]  Continuous Blood Gluc Sensor (FREESTYLE LIBRE 2 SENSOR) MISC Inject into the skin every 14 (fourteen) days. 05/08/20   [provider]    Physical Exam: Vitals:   07/21/21 1445 07/21/21 1500 07/21/21 1515 07/21/21 1530  BP: 125/66 132/70 135/72 140/73  Pulse: 92 92 93 93  Resp: 11 17 17 17   Temp:      TempSrc:      SpO2: 97% 96% 98% 98%  Weight:      Height:        Constitutional: NAD, calm, comfortable Vitals:   07/21/21 1445 07/21/21 1500 07/21/21 1515 07/21/21 1530  BP: 125/66 132/70 135/72 140/73  Pulse: 92 92 93 93  Resp: 11 17 17 17   Temp:      TempSrc:      SpO2: 97% 96% 98% 98%  Weight:      Height:       Eyes: PERRL, lids and conjunctivae normal ENMT: Mucous membranes are dry. Posterior pharynx clear of any exudate or lesions.Normal dentition.  Neck: normal,  supple, no masses, no thyromegaly Respiratory: clear to auscultation bilaterally, no wheezing, no crackles. Normal respiratory effort. No accessory muscle use.  Cardiovascular: Regular rate and rhythm, no murmurs / rubs / gallops.  2-3+ bilateral lower extremity edema. No carotid bruits.  Abdomen: no tenderness, no masses palpated. No hepatosplenomegaly. Bowel sounds positive.  Musculoskeletal: no clubbing / cyanosis. No joint deformity upper and lower extremities. Good ROM, no contractures. Normal muscle tone.  Skin: no rashes, lesions, ulcers. No induration Neurologic: CN 2-12 grossly intact. Sensation intact, DTR normal. Strength 5/5 in all 4.  Psychiatric: Normal judgment and insight. Alert and oriented x 3. Normal mood.   Labs on Admission: I have personally reviewed following labs and imaging studies  CBC: Recent Labs  Lab 07/21/21 1139  WBC 9.2  NEUTROABS 6.7  HGB 10.2*  HCT 28.6*  MCV 86.7  PLT 659    Basic Metabolic Panel: Recent Labs  Lab 07/21/21 1139  NA 117*  K 3.8  CL 87*  CO2 20*  GLUCOSE 296*  BUN 29*  CREATININE 2.15*  CALCIUM 9.2  MG 1.9    GFR: Estimated Creatinine Clearance: 33.7 mL/min (A) (by C-G formula based on SCr of 2.15 mg/dL (H)).  Liver Function Tests: Recent Labs  Lab 07/21/21 1139  AST 34  ALT 23  ALKPHOS 55  BILITOT 1.4*  PROT 6.2*  ALBUMIN 3.1*    Urine analysis:    Component Value Date/Time   COLORURINE YELLOW 06/28/2021 [place] 06/28/2021 1721   LABSPEC 1.009 06/28/2021 1721   PHURINE 7.0 06/28/2021 1721   GLUCOSEU 150 (A) 06/28/2021 1721   HGBUR NEGATIVE 06/28/2021 1721   BILIRUBINUR NEGATIVE 06/28/2021 1721   KETONESUR 5 (A) 06/28/2021 1721   PROTEINUR >=300 (A) 06/28/2021 1721   NITRITE NEGATIVE 06/28/2021 1721   LEUKOCYTESUR NEGATIVE 06/28/2021 1721    Radiological Exams on Admission: DG Chest 2 View  Result Date: 07/21/2021 CLINICAL DATA:  Shortness of breath, dizziness, and chills for 3  days. EXAM: CHEST - 2 VIEW COMPARISON:  None. FINDINGS: The heart size and mediastinal contours are within normal limits. Mild pulmonary vascular congestion. Trace bilateral pleural effusions. No consolidation or pneumothorax. No acute osseous abnormality. IMPRESSION: 1. Mild pulmonary vascular congestion with trace bilateral pleural effusions. Electronically Signed   By: Titus Dubin M.D.   On: 07/21/2021 09:52    EKG: Independently reviewed.  Prolonged PR interval.  No ST T wave elevations.  Nonspecific EKG changes.  Assessment/Plan Principal Problem:   NSTEMI (non-ST elevated myocardial infarction) ([place]) Active Problems:   Orthostatic hypotension   Hyponatremia   Hypertensive urgency   Chronic kidney disease, stage 3a ([place])   Insulin-requiring or dependent type II diabetes mellitus ([place])   Hypothyroidism   #1 non-STEMI -Patient presenting with substernal chest pain with both typical and atypical features.  Patient with complaints of burning chest pain and burning upper epigastric pain with concerns for possible GI contributing. -EKG with prolonged PR interval. -Cardiac enzymes elevated with high-sensitivity troponin at 750 >>> 822 per -Check a 2D echo. -Placed on aspirin, resume home regimen beta-blocker, patient on IV heparin. -Nitroglycerin as needed. -Cardiology consulted and recommended admission to [facility]. -Appreciate cardiology input and recommendations.  2.  Atypical chest pain/upper abdominal pain -Patient with complaints of burning sensation in chest and upper abdomen. -Concern for GI etiology. -GI cocktail x1 and then as needed every 6 hours. -PPI.  3.  Severe hyponatremia -Patient recently admitted with hyponatremia which was felt likely secondary to diuretics at that time. -Patient and wife stated on discharge stable to if increased lower extremity edema could resume Lasix 20 mg twice daily q. OD which patient has been doing. -Questionable etiology.  May be  secondary to volume overload as patient with lower extremity edema, versus secondary to excess diuresis. -Check a UA with cultures and sensitivities, urine sodium, urine creatinine, urine and serum osmolality, cortisol level. -Patient with recent TSH obtained during last hospitalization of 4.407 (06/30/2021). -May need IV Lasix however will defer until patient has been seen by nephrology. -Consult with nephrology for further evaluation.  4.  Hypothyroidism -Patient with recent TSH of 4.407 (06/30/2021). -Continue home dose Synthroid.  5.  Diabetes mellitus type II -Hemoglobin A1c 6.9 (06/29/2021). -Resume home regimen long-acting Toujeo. -SSI. -May need meal coverage NovoLog however will monitor for now.  6.  Anemia -Check an anemia panel. -Follow H&H. -Transfusion threshold hemoglobin < 7.  7.  Bilateral lower extremity edema -Concern for volume overload as patient now with NSTEMI versus secondary to hypoalbuminemia. -Albumin level at 3.1. -We will give a dose of IV albumin x1. -Check a 2D echo. -Cardiology consulted and following.  8.  Hypertension -Resume home regimen clonidine, Coreg, diltiazem, hydralazine.  9.  History of orthostatic hypotension -Stable. -Monitor with BP meds.  10.  Acute kidney injury on chronic kidney disease stage IIIa -Questionable etiology.  Creatinine on admission at 2.15 with last creatinine on 07/01/2021 at 1.46 -Could be likely to a prerenal azotemia secondary to volume overload in the setting of diuretics. -Patient volume overloaded on examination with lower extremity edema. -Check urine sodium, urine creatinine, UA with cultures and sensitivities. -May need IV diuretics however will hold off until patient is seen in consultation by nephrology. -Strict I's and O's -Daily weights -Supportive care.  11.  Chronic constipation -Resume home regimen of tapwater enemas q. OD.   DVT prophylaxis: Full dose heparin Code Status:   Full Family  Communication:  Updated patient and wife at bedside Disposition Plan:   Patient is from:  Home  Anticipated DC to:  Home  Anticipated DC date:  3 to 4 days  Anticipated DC barriers: Clinical improvement, resolution of severe hyponatremia, clearance by cardiology and nephrology  Consults called: Cardiology: Dr. Virgina Jock Admission status: Admit to inpatient progressive care [facility]  Severity of Illness: The appropriate patient status for this patient is INPATIENT. Inpatient status is judged to be reasonable and necessary in order to provide the required intensity of service to ensure the patient's safety. The patient's presenting symptoms, physical exam findings, and initial radiographic and laboratory data in the context of their chronic comorbidities is felt to place them at high risk for further clinical deterioration. Furthermore, it is not anticipated that the patient will be medically stable for discharge from the hospital within 2 midnights of admission.   * I certify that at the point of admission it is my clinical judgment that the patient will require inpatient hospital care spanning beyond 2 midnights from the point of admission due to high intensity of service, high risk for further deterioration and high frequency of surveillance required.*    Irine Seal MD [facility]  How to contact the [facility] Attending or Consulting provider [place] or covering provider during after hours [place], for this patient?   Check the care team in [facility] and look for a) attending/consulting [facility] provider listed and b) the [facility] team listed Log into [email or web address] and use [place]'s universal password to access. If you do not have the password, please contact the hospital operator. Locate the [facility] provider you are looking for under [facility] and page to a number that you can be directly reached. If you still have difficulty reaching the provider, please page the [facility]  (Director on Call) for the Hospitalists listed on amion for assistance.  07/21/2021, 4:40 PM

## 2021-07-21 NOTE — Consult Note (Signed)
Reason for Consult:Hyponatremia Referring Physician: Grandville Silos, MD  Alexandr Bramhall is an 60 y.o. male has a PMH significant for HTN, DM type 2, and CKD stage IIIb (baseline Scr 1.6-2 followed by Dr. Posey Pronto, presumably due to diabetic nephropathy) who presented to [facility] ED with a 2 day history of a burning sensation in his chest and abdomen accompanied by weakness, dizziness, numbness of his extremities as well as N/V x 1 episode.  He was hospitalized from 10/5-10/8/22 with hyponatremia at 123 that improved with holding lasix and giving IVF's up to 133.  He was discharged to home without lasix but had noted increased lower extremity edema, orthopnea, and 10 lbs weight gain.  He restarted lasix 40 mg bid but stopped several days ago.    In the ED, he was noted to be hypertensive at 180/83.  CXR revealed mild pulmonary vascular congestion with trace bilateral pleural effusions.  Labs were notable for Na 117, Cl 87, Co2 20, BUN 29, Cr 2.15, gluc 296, Ca 9.2, Mg 1.9, alb 3.1, and troponin 750 increasing to 822.  He was admitted for Cardiology evaluation and we were consulted to further help evaluate and manage his hyponatremia.  The trend in sodium is seen below.  He is not on an SSRI or HCTZ.  Trend in Serum Sodium: Sodium  Date/Time Value Ref Range Status  07/21/2021 11:39 AM 117 (LL) 135 - 145 mmol/L Final  07/01/2021 01:54 AM 133 (L) 135 - 145 mmol/L Final  06/30/2021 05:11 AM 130 (L) 135 - 145 mmol/L Final  06/29/2021 03:21 PM 128 (L) 135 - 145 mmol/L Final  06/29/2021 04:43 AM 128 (L) 135 - 145 mmol/L Final  06/28/2021 05:27 PM 123 (L) 135 - 145 mmol/L Final    PMH:   Past Medical History:  Diagnosis Date   Anemia    CKD (chronic kidney disease) stage 3, GFR 30-59 ml/min (HCC)    Hypertension    Idiopathic progressive neuropathy    Secondary hyperparathyroidism, renal (HCC)    Type 2 diabetes mellitus (HCC)     PSH:   Past Surgical History:  Procedure Laterality Date   GIVENS CAPSULE  STUDY N/A 09/01/2020   Procedure: GIVENS CAPSULE STUDY;  Surgeon: Juanita Craver, MD;  Location: [facility] ENDOSCOPY;  Service: Endoscopy;  Laterality: N/A;    Allergies: No Known Allergies  Medications:   Prior to Admission medications   Medication Sig Start Date End Date Taking? Authorizing Provider  atorvastatin (LIPITOR) 20 MG tablet Take 20 mg by mouth daily after lunch.  04/12/20  Yes [provider]  carvedilol (COREG) 25 MG tablet Take 1 tablet (25 mg total) by mouth 2 (two) times daily with a meal. 07/01/21  Yes Nicole Kindred A, DO  cloNIDine (CATAPRES) 0.2 MG tablet Take 1 tablet (0.2 mg total) by mouth every evening. 07/01/21  Yes Nicole Kindred A, DO  diltiazem (CARDIZEM CD) 120 MG 24 hr capsule Take 1 capsule (120 mg total) by mouth daily. 07/02/21  Yes Nicole Kindred A, DO  EUTHYROX 137 MCG tablet Take 137 mcg by mouth daily. 06/05/21  Yes [provider]  ferrous sulfate 325 (65 FE) MG tablet Take 650 mg by mouth daily after supper.    Yes [provider]  furosemide (LASIX) 20 MG tablet Take 20 mg by mouth 2 (two) times daily. 07/10/21  Yes [provider]  gabapentin (NEURONTIN) 300 MG capsule Take 300 mg by mouth 3 (three) times daily. 01/26/21  Yes [provider]  Ruthell Rummage  KWIKPEN 100 UNIT/ML KwikPen Inject 10-15 Units into the skin 3 (three) times daily before meals. Sliding Scale Insulin 05/09/20  Yes [provider]  hydrALAZINE (APRESOLINE) 100 MG tablet Take 1 tablet (100 mg total) by mouth 3 (three) times daily. 07/01/21  Yes Nicole Kindred A, DO  Iron-FA-B Cmp-C-Biot-Probiotic (FUSION PLUS) CAPS Take 1 capsule by mouth daily. 07/01/21  Yes [provider]  magnesium oxide (MAG-OX) 400 MG tablet Take 400 mg by mouth daily after lunch.   Yes [provider]  Multiple Vitamin (MULTIVITAMIN WITH MINERALS) TABS tablet Take 1 tablet by mouth daily after lunch.   Yes [provider]  Omega-3 1000 MG CAPS Take  2,000 mg by mouth at bedtime.    Yes [provider]  TOUJEO SOLOSTAR 300 UNIT/ML Solostar Pen Inject 30 Units into the skin daily. 30 05/08/20  Yes [provider]  vitamin B-12 (CYANOCOBALAMIN) 1000 MCG tablet Take 1,000 mcg by mouth daily.   Yes [provider]  Continuous Blood Gluc Sensor (FREESTYLE LIBRE 2 SENSOR) MISC Inject into the skin every 14 (fourteen) days. 05/08/20   [provider]    Discontinued Meds:   Medications Discontinued During This Encounter  Medication Reason   lactated ringers bolus 1,000 mL    ondansetron (ZOFRAN) injection 4 mg    levothyroxine (SYNTHROID) 125 MCG tablet    0.9 %  sodium chloride infusion    furosemide (LASIX) injection 40 mg     Social History:  reports that he has never smoked. He has never used smokeless tobacco. He reports that he does not drink alcohol and does not use drugs.  Family History:   Family History  Problem Relation Age of Onset   Stroke Mother    Hypertension Mother    Diabetes Father     Pertinent items are noted in HPI.  Blood pressure (!) 157/80, pulse 91, temperature 97.7 F (36.5 C), temperature source Oral, resp. rate 17, height 5\' 7"  (1.702 m), weight 73 kg, SpO2 100 %. General appearance: alert, cooperative, and no distress Head: Normocephalic, without obvious abnormality, atraumatic Resp: clear to auscultation bilaterally Cardio: regular rate and rhythm, S1, S2 normal, no murmur, click, rub or gallop GI: soft, non-tender; bowel sounds normal; no masses,  no organomegaly Extremities: edema 2+ pitting edema bilateral lower extremities to knees  Labs: Basic Metabolic Panel: Recent Labs  Lab 07/21/21 1139  NA 117*  K 3.8  CL 87*  CO2 20*  GLUCOSE 296*  BUN 29*  CREATININE 2.15*  ALBUMIN 3.1*  CALCIUM 9.2   Liver Function Tests: Recent Labs  Lab 07/21/21 1139  AST 34  ALT 23  ALKPHOS 55  BILITOT 1.4*  PROT 6.2*  ALBUMIN 3.1*   Recent Labs  Lab  07/21/21 1139  LIPASE 19   No results for input(s): AMMONIA in the last 168 hours. CBC: Recent Labs  Lab 07/21/21 1139  WBC 9.2  NEUTROABS 6.7  HGB 10.2*  HCT 28.6*  MCV 86.7  PLT 246   PT/INR: @labrcntip (inr:5) Cardiac Enzymes: No results for input(s): CKTOTAL, CKMB, CKMBINDEX, TROPONINI in the last 168 hours. CBG: Recent Labs  Lab 07/21/21 0926  GLUCAP 243*    Iron Studies: No results for input(s): IRON, TIBC, TRANSFERRIN, FERRITIN in the last 168 hours.  Xrays/Other Studies: DG Chest 2 View  Result Date: 07/21/2021 CLINICAL DATA:  Shortness of breath, dizziness, and chills for 3 days. EXAM: CHEST - 2 VIEW COMPARISON:  None. FINDINGS: The heart size and  mediastinal contours are within normal limits. Mild pulmonary vascular congestion. Trace bilateral pleural effusions. No consolidation or pneumothorax. No acute osseous abnormality. IMPRESSION: 1. Mild pulmonary vascular congestion with trace bilateral pleural effusions. Electronically Signed   By: Titus Dubin M.D.   On: 07/21/2021 09:52     Assessment/Plan:  Hyponatremia - hypervolemic on exam but also with elevated glucose level.  His last episode of hyponatremia responding to stopping lasix, however he is markedly volume overloaded today.  UNa, Uosm, Sosm, cortisol, and BNP ordered, however not yet drawn.  I agree with Dr. Virgina Jock regarding need to start IV lasix 40 mg bid given volume overload and hyponatremia but need to have urine obtained before giving IV lasix. Obtain urine studies then start IV lasix 40 mg bid Follow sodium levels every 4 hours. Elevated troponins - concerning with chest and abdominal "burning sensation".  Cardiology following and want him to be transferred to [facility] for further cardiologic workup.  Started on aspirin and heparin.  Currently taking beta blocker. Volume overload - pt with pitting edema, JVD, and pleural effusions.  Agree with IV lasix as above as well as repeat ECHO to evaluate  EF.  Will also order 24 hour urine for protein as he may have developed nephrotic syndrome from his diabetic nephropathy.  DM type 2 - hgb A1c 6.9% on 06/29/21 but elevated glucose today, further lowering serum sodium.  Management per primary service. Anemia of CKD stage IIIb - will check iron stores and follow H/H. HTN - was elevated initially but now at goal.   AKI/CKD stage IIIb - Scr slightly above baseline.  Had been on olmesartan at time of last admission, but would hold ARB for now and follow I's/O's and daily Scr. Hypothyroidism - recent TSH 4.407 earlier this month.   Governor Rooks Dyland Panuco 07/21/2021, 5:56 PM

## 2021-07-21 NOTE — Progress Notes (Signed)
Pt CBG 434. Stat lab verification ordered and MD paged. Orders received and administered, see MAR.   Pt attempted x2 to produce urine sample and was unsuccessful. Pt did want to be catheterized at this time and wanted to wait for his wife, relayed to nightshift RN.  Clyde Canterbury, RN

## 2021-07-21 NOTE — Progress Notes (Addendum)
CBG 434.  Pt does have diet order.  Pt hasnt taken long acting insulin today (usually takes at 3pm).  Give 10u novolog now and 30u lantus now.  Recheck CBG in 1h.  Will switch diet to carb mod.

## 2021-07-22 ENCOUNTER — Inpatient Hospital Stay (HOSPITAL_COMMUNITY): Payer: BC Managed Care – PPO

## 2021-07-22 DIAGNOSIS — I214 Non-ST elevation (NSTEMI) myocardial infarction: Secondary | ICD-10-CM | POA: Diagnosis not present

## 2021-07-22 DIAGNOSIS — R778 Other specified abnormalities of plasma proteins: Secondary | ICD-10-CM

## 2021-07-22 DIAGNOSIS — I5031 Acute diastolic (congestive) heart failure: Secondary | ICD-10-CM

## 2021-07-22 DIAGNOSIS — N183 Chronic kidney disease, stage 3 unspecified: Secondary | ICD-10-CM

## 2021-07-22 DIAGNOSIS — I130 Hypertensive heart and chronic kidney disease with heart failure and stage 1 through stage 4 chronic kidney disease, or unspecified chronic kidney disease: Secondary | ICD-10-CM

## 2021-07-22 DIAGNOSIS — R0989 Other specified symptoms and signs involving the circulatory and respiratory systems: Secondary | ICD-10-CM

## 2021-07-22 LAB — GLUCOSE, CAPILLARY
Glucose-Capillary: 292 mg/dL — ABNORMAL HIGH (ref 70–99)
Glucose-Capillary: 118 mg/dL — ABNORMAL HIGH (ref 70–99)
Glucose-Capillary: 242 mg/dL — ABNORMAL HIGH (ref 70–99)
Glucose-Capillary: 233 mg/dL — ABNORMAL HIGH (ref 70–99)

## 2021-07-22 LAB — ECHOCARDIOGRAM COMPLETE
Area-P 1/2: 5.02 cm2
Height: 67 in
S' Lateral: 2.8 cm
Weight: 2958.4 oz

## 2021-07-22 LAB — LIPID PANEL
Cholesterol: 134 mg/dL (ref 0–200)
HDL: 44 mg/dL (ref >40)
LDL Cholesterol: 82 mg/dL (ref 0–99)
Total CHOL/HDL Ratio: 3 RATIO
Triglycerides: 40 mg/dL (ref <150)
VLDL: 8 mg/dL (ref 0–40)

## 2021-07-22 LAB — CBC
HCT: 23.3 % — ABNORMAL LOW (ref 39.0–52.0)
Hemoglobin: 8.6 g/dL — ABNORMAL LOW (ref 13.0–17.0)
MCH: 31.5 pg (ref 26.0–34.0)
MCHC: 36.9 g/dL — ABNORMAL HIGH (ref 30.0–36.0)
MCV: 85.3 fL (ref 80.0–100.0)
Platelets: 172 10*3/uL (ref 150–400)
RBC: 2.73 MIL/uL — ABNORMAL LOW (ref 4.22–5.81)
RDW: 11.8 % (ref 11.5–15.5)
WBC: 6.7 10*3/uL (ref 4.0–10.5)
nRBC: 0 % (ref 0.0–0.2)

## 2021-07-22 LAB — RENAL FUNCTION PANEL
Albumin: 2.6 g/dL — ABNORMAL LOW (ref 3.5–5.0)
Anion gap: 7 (ref 5–15)
BUN: 27 mg/dL — ABNORMAL HIGH (ref 8–23)
CO2: 21 mmol/L — ABNORMAL LOW (ref 22–32)
Calcium: 8.5 mg/dL — ABNORMAL LOW (ref 8.9–10.3)
Chloride: 88 mmol/L — ABNORMAL LOW (ref 98–111)
Creatinine, Ser: 2.31 mg/dL — ABNORMAL HIGH (ref 0.61–1.24)
GFR, Estimated: 31 mL/min — ABNORMAL LOW (ref >60)
Glucose, Bld: 357 mg/dL — ABNORMAL HIGH (ref 70–99)
Phosphorus: 3 mg/dL (ref 2.5–4.6)
Potassium: 4 mmol/L (ref 3.5–5.1)
Sodium: 116 mmol/L — CL (ref 135–145)

## 2021-07-22 LAB — SODIUM
Sodium: 120 mmol/L — ABNORMAL LOW (ref 135–145)
Sodium: 121 mmol/L — ABNORMAL LOW (ref 135–145)
Sodium: 122 mmol/L — ABNORMAL LOW (ref 135–145)
Sodium: 123 mmol/L — ABNORMAL LOW (ref 135–145)
Sodium: 124 mmol/L — ABNORMAL LOW (ref 135–145)

## 2021-07-22 LAB — IRON AND TIBC
Iron: 81 ug/dL (ref 45–182)
Saturation Ratios: 41 % — ABNORMAL HIGH (ref 17.9–39.5)
TIBC: 197 ug/dL — ABNORMAL LOW (ref 250–450)
UIBC: 116 ug/dL

## 2021-07-22 LAB — FOLATE: Folate: 35 ng/mL (ref >5.9)

## 2021-07-22 LAB — OSMOLALITY: Osmolality: 261 mOsm/kg — ABNORMAL LOW (ref 275–295)

## 2021-07-22 LAB — HEPARIN LEVEL (UNFRACTIONATED)
Heparin Unfractionated: <0.1 IU/mL — ABNORMAL LOW (ref 0.30–0.70)
Heparin Unfractionated: 0.35 IU/mL (ref 0.30–0.70)

## 2021-07-22 LAB — FERRITIN: Ferritin: 104 ng/mL (ref 24–336)

## 2021-07-22 LAB — VITAMIN B12: Vitamin B-12: 683 pg/mL (ref 180–914)

## 2021-07-22 MED ORDER — HEPARIN BOLUS VIA INFUSION
2000.0000 [IU] | Freq: Once | INTRAVENOUS | Status: AC
  Administered 2021-07-22: 2000 [IU] via INTRAVENOUS
  Filled 2021-07-22: qty 2000

## 2021-07-22 MED ORDER — FUROSEMIDE 10 MG/ML IJ SOLN
20.0000 mg | Freq: Two times a day (BID) | INTRAMUSCULAR | Status: DC
  Administered 2021-07-23 – 2021-07-24 (×3): 20 mg via INTRAVENOUS
  Filled 2021-07-22 (×3): qty 2

## 2021-07-22 MED ORDER — FLUTICASONE PROPIONATE 50 MCG/ACT NA SUSP
1.0000 | Freq: Every day | NASAL | Status: DC
  Administered 2021-07-22 – 2021-07-25 (×5): 2 via NASAL
  Filled 2021-07-22: qty 16

## 2021-07-22 MED ORDER — ENSURE ENLIVE PO LIQD
237.0000 mL | Freq: Two times a day (BID) | ORAL | Status: DC
  Administered 2021-07-23 – 2021-07-25 (×5): 237 mL via ORAL

## 2021-07-22 NOTE — Progress Notes (Signed)
Progress Note  Patient Name: Tim Spencer Date of Encounter: 07/22/2021  Attending physician: Shawna Clamp, MD Primary care provider: Lin Landsman, MD Primary Cardiologist: Dr. Vernell Leep  Subjective: Tim Spencer is a 60 y.o. male who was seen and examined at bedside Currently denies wife at bedside  Denies any chest pain or burning-like sensation since admission to [facility] Resting in bed comfortably. No orthopnea, paroxysmal nocturnal dyspnea or lower extremity swelling.  Objective: Vital Signs in the last 24 hours: Temp:  [97.6 F (36.4 C)-98.3 F (36.8 C)] 98.2 F (36.8 C) (10/29 1100) Pulse Rate:  [75-97] 75 (10/29 1100) Resp:  [11-28] 21 (10/29 1100) BP: (125-170)/(64-98) 136/65 (10/29 1100) SpO2:  [96 %-100 %] 98 % (10/29 1100) Weight:  [83.9 kg] 83.9 kg (10/29 0500)  Intake/Output:  Intake/Output Summary (Last 24 hours) at 07/22/2021 1348 Last data filed at 07/22/2021 0742 Gross per 24 hour  Intake 985.36 ml  Output 1730 ml  Net -744.64 ml    Net IO Since Admission: -744.64 mL [07/22/21 1348]  Weights:  Filed Weights   07/21/21 0925 07/22/21 0500  Weight: 73 kg 83.9 kg    Telemetry: Personally reviewed.  Normal sinus  Physical examination: PHYSICAL EXAM: Vitals with BMI 07/22/2021 07/22/2021 07/22/2021  Height - - -  Weight - 184 lbs 14 oz -  BMI - 02.40 -  Systolic 973 - 532  Diastolic 65 - 81  Pulse 75 - 78    CONSTITUTIONAL: Well-developed and well-nourished. No acute distress.  SKIN: Skin is warm and dry. No rash noted. No cyanosis. No pallor. No jaundice HEAD: Normocephalic and atraumatic.  EYES: No scleral icterus MOUTH/THROAT: Moist oral membranes.  NECK: JVD present. No thyromegaly noted.  Left carotid bruit LYMPHATIC: No visible cervical adenopathy.  CHEST Normal respiratory effort. No intercostal retractions  LUNGS: Clear to auscultation bilaterally.  No stridor. No wheezes. No rales.  CARDIOVASCULAR:  Regular, positive S1-S2, no murmurs rubs or gallops appreciated ABDOMINAL: Soft, nontender, nondistended, positive bowel sounds in all 4 quadrants, no apparent ascites.  EXTREMITIES: +1 bilateral peripheral edema, warm to touch HEMATOLOGIC: No significant bruising NEUROLOGIC: Oriented to person, place, and time. Nonfocal. Normal muscle tone.  PSYCHIATRIC: Normal mood and affect. Normal behavior. Cooperative  Lab Results: Hematology Recent Labs  Lab 07/21/21 1139 07/22/21 0002  WBC 9.2 6.7  RBC 3.30* 2.73*  HGB 10.2* 8.6*  HCT 28.6* 23.3*  MCV 86.7 85.3  MCH 30.9 31.5  MCHC 35.7 36.9*  RDW 11.9 11.8  PLT 246 172    Chemistry Recent Labs  Lab 07/21/21 1139 07/21/21 1905 07/21/21 2119 07/22/21 0002 07/22/21 0501 07/22/21 1027  NA 117*  --    < > 116* 120* 121*  K 3.8  --   --  4.0  --   --   CL 87*  --   --  88*  --   --   CO2 20*  --   --  21*  --   --   GLUCOSE 296* 408*  --  357*  --   --   BUN 29*  --   --  27*  --   --   CREATININE 2.15*  --   --  2.31*  --   --   CALCIUM 9.2  --   --  8.5*  --   --   PROT 6.2*  --   --   --   --   --   ALBUMIN 3.1*  --   --  2.6*  --   --   AST 34  --   --   --   --   --   ALT 23  --   --   --   --   --   ALKPHOS 55  --   --   --   --   --   BILITOT 1.4*  --   --   --   --   --   GFRNONAA 34*  --   --  31*  --   --   ANIONGAP 10  --   --  7  --   --    < > = values in this interval not displayed.     Cardiac Enzymes: Cardiac Panel (last 3 results) Recent Labs    07/21/21 1139 07/21/21 1517 07/21/21 1727  TROPONINIHS 822* 437* 581*    BNP (last 3 results) Recent Labs    06/28/21 1727 06/29/21 1521 07/21/21 1905  BNP 429.3* 530.5* 579.4*    ProBNP (last 3 results) No results for input(s): PROBNP in the last 8760 hours.   DDimer No results for input(s): DDIMER in the last 168 hours.   Hemoglobin A1c:  Lab Results  Component Value Date   HGBA1C 6.9 (H) 06/29/2021   MPG 151.33 06/29/2021    TSH   Recent Labs    06/30/21 0511  TSH 4.407    Lipid Panel     Component Value Date/Time   CHOL 134 07/22/2021 0501   TRIG 40 07/22/2021 0501   HDL 44 07/22/2021 0501   CHOLHDL 3.0 07/22/2021 0501   VLDL 8 07/22/2021 0501   LDLCALC 82 07/22/2021 0501    Imaging: DG Chest 2 View  Result Date: 07/21/2021 CLINICAL DATA:  Shortness of breath, dizziness, and chills for 3 days. EXAM: CHEST - 2 VIEW COMPARISON:  None. FINDINGS: The heart size and mediastinal contours are within normal limits. Mild pulmonary vascular congestion. Trace bilateral pleural effusions. No consolidation or pneumothorax. No acute osseous abnormality. IMPRESSION: 1. Mild pulmonary vascular congestion with trace bilateral pleural effusions. Electronically Signed   By: Titus Dubin M.D.   On: 07/21/2021 09:52    Cardiac database: EKG 07/21/2021: Sinus rhythm 93 bpm Possible old anteroseptal infarct Nonspecific ST-T changes   Echocardiogram: 03/03/2020:  Normal LVEF Mild left atrial enlargement Trace MR, TR. RVSP 23 mmHg  Pending  Scheduled Meds:  aspirin  324 mg Oral NOW   Or   aspirin  300 mg Rectal NOW   aspirin EC  81 mg Oral Daily   atorvastatin  20 mg Oral QPC lunch   carvedilol  25 mg Oral BID WC   cloNIDine  0.2 mg Oral QPM   diltiazem  120 mg Oral Daily   ferrous sulfate  650 mg Oral QPC supper   fluticasone  1-2 spray Each Nare Daily   furosemide  20 mg Intravenous BID   gabapentin  300 mg Oral TID   hydrALAZINE  100 mg Oral TID   insulin aspart  0-5 Units Subcutaneous QHS   insulin aspart  0-9 Units Subcutaneous TID WC   insulin glargine-yfgn  30 Units Subcutaneous Daily   levothyroxine  137 mcg Oral Q0600   magnesium oxide  400 mg Oral QPC lunch   multivitamin with minerals  1 tablet Oral QPC lunch   omega-3 acid ethyl esters  2,000 mg Oral QHS   pantoprazole  40 mg Oral Q0600   vitamin B-12  1,000 mcg Oral  Daily    Continuous Infusions:  heparin 1,200 Units/hr (07/22/21 0742)     PRN Meds: acetaminophen, ALPRAZolam, alum & mag hydroxide-simeth **AND** lidocaine, nitroGLYCERIN, ondansetron (ZOFRAN) IV   IMPRESSION & RECOMMENDATIONS: Tim Spencer is a 60 y.o. male whose past medical history and cardiac risk factors include: Hypertension with baseline chronic kidney disease stage III, idiopathic progressive neuropathy, history of orthostatic hypertension, insulin-dependent diabetes mellitus.  Elevated troponins: Most likely secondary to supply demand ischemia but underlying ischemia cannot be ruled out.   No active chest pain.  But 3 days prior he describes having chest burning involving his abdomen, anterior chest wall, and neck.  The discomfort is nonexertional and self-limited. High sensitive troponins peaked at 822. Echocardiogram pending The timing of ischemic evaluation will be dependent on his renal function, LVEF, and clinical course. If LVEF is preserved would recommend outpatient testing with up titration of GDMT and antianginal therapy. If LVEF is reduced and patient is asymptomatic would recommend stress test. Patient is asked to inform the nurse if he has new onset or if his chest pain/burning resurfaces during this hospitalization so that appropriate care can be provided in a timely fashion. Continue with IV heparin drip for 48 hours. Continue aspirin and statin therapy. Sublingual nitroglycerin tablets as needed  Acute exacerbation of HFpEF, stage C, NYHA class II/III: Improving. Shortness of breath on presentation, vascular congestion on chest x-ray, with bilateral lower extremity swelling and JVP. Continue with diuresis Echocardiogram pending Currently on beta-blocker and calcium channel blockers, Lasix, hydralazine. Currently not on ACE inhibitor's/ARB/Arni due to renal function Currently not on Aldactone due to renal function Recommend Farxiga 10 mg p.o. daily if cleared by nephrology as they are currently managing his hyponatremia and  monitoring urine studies. Continue to monitor Strict I's and O's and daily weights  Hyponatremia: Most likely secondary to acute exacerbation of HFpEF leading to hypervolemic hyponatremia, pseudohyponatremia in the setting of hyperglycemia, and other etiologies currently being investigated by nephrology. Will defer to nephrology at this time.  Left carotid bruit: Noted on physical examination. Last carotid duplex September 2021 noted no significant stenosis. Recommend outpatient follow-up Currently on aspirin and statin therapy  Insulin-dependent diabetes mellitus type 2: Most recent hemoglobin A1c 6.9 Currently on statin therapy. ARB/ACE inhibitor's/Arni held secondary to renal function may consider prior to discharge. Educated on the importance of glycemic control and management of his other modifiable cardiovascular risk factors.  Anemia of chronic kidney disease stage IIIb: Currently being managed by primary team and nephrology.  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  Patient's questions and concerns were addressed to his satisfaction. He voices understanding of the instructions provided during this encounter.   This note was created using a voice recognition software as a result there may be grammatical errors inadvertently enclosed that do not reflect the nature of this encounter. Every attempt is made to correct such errors.  Rex Kras, DO, [facility]. [facility] Office: [phone] 07/22/2021, 1:48 PM

## 2021-07-22 NOTE — Progress Notes (Signed)
   07/22/21 0133  Notify: Provider  Provider Name/Title Gadner MD  Date Provider Notified 07/22/21  Time Provider Notified [phone]  Notification Type Page  Notification Reason Critical result (Sodium level 116.)  Patient asleep, breathing regular and unlabored, no sign of distress noted will continue to monitor.

## 2021-07-22 NOTE — Progress Notes (Signed)
ANTICOAGULATION CONSULT NOTE - Follow Up Consult  Pharmacy Consult for heparin Indication:  r/o ACS  Labs: Recent Labs    07/21/21 1139 07/21/21 1517 07/21/21 1727 07/21/21 1905 07/22/21 0002 07/22/21 1027  HGB 10.2*  --   --   --  8.6*  --   HCT 28.6*  --   --   --  23.3*  --   PLT 246  --   --   --  172  --   HEPARINUNFRC  --   --   --  0.28* <0.10* 0.35  CREATININE 2.15*  --   --   --  2.31*  --   TROPONINIHS 822* 437* 581*  --   --   --      Assessment: 60yo male therapeutic at 0.35 on heparin 1200 units/hr for ACS. CBC WNL. No bleeding reported. Will continue to follow plans for ischemic work-up when Scr improves. Will slightly increase heparin to stay within therapeutic range.  Goal of Therapy:  Heparin level 0.3-0.7 units/ml   Plan:  Increase heparin to 1250 units/hr Daily CBC/heparin level  Thank you for allowing pharmacy to participate in this patient's care.  Reatha Harps, PharmD PGY1 Pharmacy Resident 07/22/2021 4:05 PM Check [email or web address] for unit specific pharmacy number

## 2021-07-22 NOTE — Progress Notes (Addendum)
PROGRESS NOTE    Tim Spencer  FEO:[id number] DOB: 11/05/1960 DOA: 07/21/2021 PCP: Lin Landsman, MD   Brief Narrative:  This 60 years old male with PMH significant for recent hospitalization 06/28/2021 - 07/01/2021 for hyponatremia secondary to diuretics, history of diabetes mellitus, CKD stage IIIa, hypertension, anemia, anxiety presented to the ED with 2-day history of burning sensation in the chest and abdomen, generalized weakness and dizziness associated with some nausea. Patient is found to have hyponatremia sodium 117, serum creatinine 2.15, high-sensitivity troponin noted to be elevated at 750> 822.  EKG shows prolonged PR interval. Patient is admitted for NSTEMI, hyponatremia, AKI on CKD, cardiology and nephrology is consulted.  Assessment & Plan:   Principal Problem:   NSTEMI (non-ST elevated myocardial infarction) ([place]) Active Problems:   Orthostatic hypotension   Hyponatremia   Hypertensive urgency   Chronic kidney disease, stage 3a ([place])   Insulin-requiring or dependent type II diabetes mellitus ([place])   Hypothyroidism  NSTEMI: Patient presented with substernal chest pain with typical and atypical features. EKG showed prolonged PR interval. Troponins 750> 885> trending down Obtain Echocardiogram. Cardiology consulted,  Patient does have CAD risk factors,  will need ischemic work-up at some point. Continue aspirin, heparin and statin for now. Continue beta-blockers Recommend ischemic  work-up once serum creatinine improves.  Atypical chest pain: Patient complains of burning sensation in the upper chest Resolved with GI cocktail. Continue PPI daily  Severe hyponatremia: > Improving Patient recently admitted with hyponatremia which was felt to be secondary to diuretics. Patient reports he was told to take Lasix 20 mg every other day at home for pedal swelling He appears volume overloaded at this time with lower extremity edema. Cardiology and nephrology recommended  to continue Lasix 20 mg twice daily for fluid overload. Patient is having good urine output, continue to monitor serum sodium.  Hypothyroidism: Continue levothyroxine.  Diabetes mellitus type 2: Hemoglobin A1c 6.5 Continue long-acting Toujeo Regular insulin sliding scale.  Normochromic normocytic anemia: Could be secondary to CKD. Transfuse if hemoglobin drops below 7.  Essential hypertension: Continue clonidine, Coreg, diltiazem, hydralazine.  Bilateral lower extremity edema: Continue Lasix 20 mg twice daily Obtain 2D echocardiogram Could be hypoalbuminemia.  Hx. of Orthostatic Hypotension: Stable.  Monitor with BP meds.  AKI on CKD stage IIIa: Creatinine on admission 2.15, baseline creatinine 1.46 Could be due to prerenal azotemia secondary to volume overload in setting of diuretics. Continue IV diuretics.  Monitor serum creatinine. Monitoring daily weight  Chronic constipation: Continue as needed Senokot.   DVT prophylaxis: Heparin Code Status: Full code. Family Communication: No family at bed side. Disposition Plan:   Status is: Inpatient  Remains inpatient appropriate because: Admitted for NSTEMI and hyponatremia  Anticipated discharge home in 1 to 2 days.   Consultants:  Nephrology Cardiology  Procedures: Antimicrobials:   Anti-infectives (From admission, onward)    None        Subjective: Patient was seen and examined at bedside.  Overnight events noted.   Patient reports feeling much improved.  He still has significant pedal edema.  Objective: Vitals:   07/21/21 2300 07/22/21 0311 07/22/21 0500 07/22/21 1100  BP: 135/64 (!) 155/81  136/65  Pulse: 79 78  75  Resp: 15 17  (!) 21  Temp: 97.6 F (36.4 C) 98.2 F (36.8 C)  98.2 F (36.8 C)  TempSrc: Oral Oral  Oral  SpO2: 98% 100%  98%  Weight:   83.9 kg   Height:  Intake/Output Summary (Last 24 hours) at 07/22/2021 1325 Last data filed at 07/22/2021 0742 Gross per 24 hour   Intake 985.36 ml  Output 1730 ml  Net -744.64 ml   Filed Weights   07/21/21 0925 07/22/21 0500  Weight: 73 kg 83.9 kg    Examination:  General exam: Appears comfortable, not in any acute distress.  Deconditioned. Respiratory system: Clear to auscultation. Respiratory effort normal. Cardiovascular system: S1-S2 heard, regular rate and rhythm, no murmur.   Gastrointestinal system: Abdomen is soft, nontender, nondistended, BS +. Central nervous system: Alert and oriented x3 . No focal neurological deficits. Extremities: Edema positive, no cyanosis, no clubbing. Skin: No rashes, lesions or ulcers Psychiatry: Judgement and insight appear normal. Mood & affect appropriate.     Data Reviewed: I have personally reviewed following labs and imaging studies  CBC: Recent Labs  Lab 07/21/21 1139 07/22/21 0002  WBC 9.2 6.7  NEUTROABS 6.7  --   HGB 10.2* 8.6*  HCT 28.6* 23.3*  MCV 86.7 85.3  PLT 246 789   Basic Metabolic Panel: Recent Labs  Lab 07/21/21 1139 07/21/21 1905 07/21/21 2119 07/22/21 0002 07/22/21 0501  NA 117*  --  118* 116* 120*  K 3.8  --   --  4.0  --   CL 87*  --   --  88*  --   CO2 20*  --   --  21*  --   GLUCOSE 296* 408*  --  357*  --   BUN 29*  --   --  27*  --   CREATININE 2.15*  --   --  2.31*  --   CALCIUM 9.2  --   --  8.5*  --   MG 1.9  --   --   --   --   PHOS  --   --   --  3.0  --    GFR: Estimated Creatinine Clearance: 34.8 mL/min (A) (by C-G formula based on SCr of 2.31 mg/dL (H)). Liver Function Tests: Recent Labs  Lab 07/21/21 1139 07/22/21 0002  AST 34  --   ALT 23  --   ALKPHOS 55  --   BILITOT 1.4*  --   PROT 6.2*  --   ALBUMIN 3.1* 2.6*   Recent Labs  Lab 07/21/21 1139  LIPASE 19   No results for input(s): AMMONIA in the last 168 hours. Coagulation Profile: No results for input(s): INR, PROTIME in the last 168 hours. Cardiac Enzymes: No results for input(s): CKTOTAL, CKMB, CKMBINDEX, TROPONINI in the last 168  hours. BNP (last 3 results) No results for input(s): PROBNP in the last 8760 hours. HbA1C: No results for input(s): HGBA1C in the last 72 hours. CBG: Recent Labs  Lab 07/21/21 1842 07/21/21 2013 07/21/21 2140 07/22/21 0636 07/22/21 1005  GLUCAP 434* 375* 355* 292* 118*   Lipid Profile: Recent Labs    07/22/21 0501  CHOL 134  HDL 44  LDLCALC 82  TRIG 40  CHOLHDL 3.0   Thyroid Function Tests: No results for input(s): TSH, T4TOTAL, FREET4, T3FREE, THYROIDAB in the last 72 hours. Anemia Panel: Recent Labs    07/22/21 0002  VITAMINB12 683  FOLATE 35.0  FERRITIN 104  TIBC 197*  IRON 81   Sepsis Labs: No results for input(s): PROCALCITON, LATICACIDVEN in the last 168 hours.  Recent Results (from the past 240 hour(s))  Resp Panel by RT-PCR (Flu A&B, Covid) Nasopharyngeal Swab     Status: None   Collection Time:  07/21/21  3:49 PM   Specimen: Nasopharyngeal Swab; Nasopharyngeal(NP) swabs in vial transport medium  Result Value Ref Range Status   SARS Coronavirus 2 by RT PCR NEGATIVE NEGATIVE Final    Comment: (NOTE) SARS-CoV-2 target nucleic acids are NOT DETECTED.  The SARS-CoV-2 RNA is generally detectable in upper respiratory specimens during the acute phase of infection. The lowest concentration of SARS-CoV-2 viral copies this assay can detect is 138 copies/mL. A negative result does not preclude SARS-Cov-2 infection and should not be used as the sole basis for treatment or other patient management decisions. A negative result may occur with  improper specimen collection/handling, submission of specimen other than nasopharyngeal swab, presence of viral mutation(s) within the areas targeted by this assay, and inadequate number of viral copies(<138 copies/mL). A negative result must be combined with clinical observations, patient history, and epidemiological information. The expected result is Negative.  Fact Sheet for Patients:   EntrepreneurPulse.com.au  Fact Sheet for Healthcare Providers:  IncredibleEmployment.be  This test is no t yet approved or cleared by the [place] FDA and  has been authorized for detection and/or diagnosis of SARS-CoV-2 by FDA under an Emergency Use Authorization (EUA). This EUA will remain  in effect (meaning this test can be used) for the duration of the COVID-19 declaration under Section 564(b)(1) of the Act, 21 U.S.C.section 360bbb-3(b)(1), unless the authorization is terminated  or revoked sooner.       Influenza A by PCR NEGATIVE NEGATIVE Final   Influenza B by PCR NEGATIVE NEGATIVE Final    Comment: (NOTE) The Xpert Xpress SARS-CoV-2/FLU/RSV plus assay is intended as an aid in the diagnosis of influenza from Nasopharyngeal swab specimens and should not be used as a sole basis for treatment. Nasal washings and aspirates are unacceptable for Xpert Xpress SARS-CoV-2/FLU/RSV testing.  Fact Sheet for Patients: EntrepreneurPulse.com.au  Fact Sheet for Healthcare Providers: IncredibleEmployment.be  This test is not yet approved or cleared by the [place] FDA and has been authorized for detection and/or diagnosis of SARS-CoV-2 by FDA under an Emergency Use Authorization (EUA). This EUA will remain in effect (meaning this test can be used) for the duration of the COVID-19 declaration under Section 564(b)(1) of the Act, 21 U.S.C. section 360bbb-3(b)(1), unless the authorization is terminated or revoked.  Performed at [facility], [place] [address], [place], [place] [address]     Radiology Studies: DG Chest 2 View  Result Date: 07/21/2021 CLINICAL DATA:  Shortness of breath, dizziness, and chills for 3 days. EXAM: CHEST - 2 VIEW COMPARISON:  None. FINDINGS: The heart size and mediastinal contours are within normal limits. Mild pulmonary vascular congestion. Trace bilateral  pleural effusions. No consolidation or pneumothorax. No acute osseous abnormality. IMPRESSION: 1. Mild pulmonary vascular congestion with trace bilateral pleural effusions. Electronically Signed   By: Titus Dubin M.D.   On: 07/21/2021 09:52     Scheduled Meds:  aspirin  324 mg Oral NOW   Or   aspirin  300 mg Rectal NOW   aspirin EC  81 mg Oral Daily   atorvastatin  20 mg Oral QPC lunch   carvedilol  25 mg Oral BID WC   cloNIDine  0.2 mg Oral QPM   diltiazem  120 mg Oral Daily   ferrous sulfate  650 mg Oral QPC supper   fluticasone  1-2 spray Each Nare Daily   furosemide  20 mg Intravenous BID   gabapentin  300 mg Oral TID   hydrALAZINE  100 mg  Oral TID   insulin aspart  0-5 Units Subcutaneous QHS   insulin aspart  0-9 Units Subcutaneous TID WC   insulin glargine-yfgn  30 Units Subcutaneous Daily   levothyroxine  137 mcg Oral Q0600   magnesium oxide  400 mg Oral QPC lunch   multivitamin with minerals  1 tablet Oral QPC lunch   omega-3 acid ethyl esters  2,000 mg Oral QHS   pantoprazole  40 mg Oral Q0600   vitamin B-12  1,000 mcg Oral Daily   Continuous Infusions:  heparin 1,200 Units/hr (07/22/21 0742)     LOS: 1 day    Time spent: 35 mins    Elia Nunley, MD [facility]   If 7PM-7AM, please contact night-coverage

## 2021-07-22 NOTE — Progress Notes (Addendum)
Inpatient Diabetes Program Recommendations  AACE/ADA: New Consensus Statement on Inpatient Glycemic Control  Target Ranges:  Prepandial:   less than 140 mg/dL      Peak postprandial:   less than 180 mg/dL (1-2 hours)      Critically ill patients:  140 - 180 mg/dL   Results for XYLAN, SHEILS (MRN [id number]) as of 07/22/2021 12:33  Ref. Range 07/21/2021 09:26 07/21/2021 18:42 07/21/2021 20:13 07/21/2021 21:40 07/22/2021 06:36 07/22/21 9:03 07/22/2021 10:05  Glucose-Capillary Latest Ref Range: 70 - 99 mg/dL 243 (H) 434 (H)  Novolog 10 units 375 (H)    Semglee 30 units @20 :11 355 (H) 292 (H)  Novolog 5 units     Semglee 30 units   118 (H)   Results for OLUWAFEMI, VILLELLA (MRN [id number]) as of 07/22/2021 12:33  Ref. Range 06/29/2021 04:43  Hemoglobin A1C Latest Ref Range: 4.8 - 5.6 % 6.9 (H)   Review of Glycemic Control  Diabetes history: DM2 Outpatient Diabetes medications: Toujeo 30 units daily, Humalog 10-15 units TID with meals Current orders for Inpatient glycemic control: Semglee 30 units daily, Novolog 0-9 units TID with meals, Novolog 0-5 units QHS  NOTE: Noted consult for Diabetes Coordinator. Diabetes Coordinator is not on campus over the weekend but available by pager from 8am to 5pm for questions or concerns. Chart reviewed. Patient admitted with NSTEMI. Glucose up to 434 mg/dl on 07/21/21. Patient received Semglee 30 units at 20:11 on 07/21/21 and Semglee 30 units already given again today at 9:03 am. With Semglee dosages being given so closely together (13 hours versus every 24 hours), patient is at risk for developing hypoglycemia. No recommendations for changes at this time with insulin orders. Will continue to follow along.  Thanks, Barnie Alderman, RN, MSN, CDE Diabetes Coordinator Inpatient Diabetes Program [phone] (Team Pager from 8am to 5pm)

## 2021-07-22 NOTE — Progress Notes (Signed)
ANTICOAGULATION CONSULT NOTE - Follow Up Consult  Pharmacy Consult for heparin Indication:  r/o ACS  Labs: Recent Labs    07/21/21 1139 07/21/21 1517 07/21/21 1727 07/21/21 1905 07/22/21 0002  HGB 10.2*  --   --   --  8.6*  HCT 28.6*  --   --   --  23.3*  PLT 246  --   --   --  172  HEPARINUNFRC  --   --   --  0.28* <0.10*  CREATININE 2.15*  --   --   --  2.31*  TROPONINIHS 822* 437* 581*  --   --     Assessment: 60yo male subtherapeutic on heparin with initial dosing for elevated troponin; no infusion issues or signs of bleeding per RN.  Goal of Therapy:  Heparin level 0.3-0.7 units/ml   Plan:  Will rebolus with heparin 2000 units and increase heparin infusion by 4 units/kg/hr to 1200 units/hr and check level in 8 hours.    Wynona Neat, PharmD, BCPS  07/22/2021,2:35 AM

## 2021-07-22 NOTE — Progress Notes (Signed)
Initial Nutrition Assessment  DOCUMENTATION CODES:   Not applicable  INTERVENTION:  Provide Ensure Enlive po BID, each supplement provides 350 kcal and 20 grams of protein  Encourage adequate PO intake.   NUTRITION DIAGNOSIS:   Increased nutrient needs related to acute illness as evidenced by estimated needs.  GOAL:   Patient will meet greater than or equal to 90% of their needs  MONITOR:   PO intake, Supplement acceptance, Skin, Weight trends, Labs, I & O's  REASON FOR ASSESSMENT:   Malnutrition Screening Tool    ASSESSMENT:   60 years old male with PMH significant for recent hospitalization 06/28/2021 - 07/01/2021 for hyponatremia secondary to diuretics, history of diabetes mellitus, CKD stage IIIa, hypertension, anemia, anxiety presented with 2-day history of burning sensation in the chest and abdomen, generalized weakness and dizziness associated with some nausea. Patient is admitted for NSTEMI, hyponatremia, AKI on CKD. Pt currently on IV diuretics.   RD unable to obtain pt nutrition history at this time. Per weight records, pt with no weight loss. RD to order nutritional supplements to aid in caloric and protein needs. Unable to complete Nutrition-Focused physical exam at this time.   Labs and medications reviewed.   Diet Order:   Diet Order             Diet Carb Modified Fluid consistency: Thin; Room service appropriate? Yes; Fluid restriction: 1200 mL Fluid  Diet effective now                   EDUCATION NEEDS:   Not appropriate for education at this time  Skin:  Skin Assessment: Reviewed RN Assessment  Last BM:  10/25  Height:   Ht Readings from Last 1 Encounters:  07/21/21 5\' 7"  (1.702 m)    Weight:   Wt Readings from Last 1 Encounters:  07/22/21 83.9 kg   BMI:  Body mass index is 28.96 kg/m.  Estimated Nutritional Needs:   Kcal:  2000-2200  Protein:  100-110 grams  Fluid:  1.2 L/day  Corrin Parker, MS, RD, LDN RD pager  number/after hours weekend pager number on Amion.

## 2021-07-22 NOTE — Progress Notes (Signed)
*  PRELIMINARY RESULTS* Echocardiogram 2D Echocardiogram has been performed.  Tim Spencer 07/22/2021, 3:53 PM

## 2021-07-22 NOTE — Progress Notes (Addendum)
Patient ID: Tim Spencer, male   DOB: 07/12/1961, 60 y.o.   MRN: [id number] S: Pt refused 40 mg of IV lasix last night but finally agreed to 20 mg with improved UOP and serum sodium levels O:BP (!) 155/81 (BP Location: Right Arm)   Pulse 78   Temp 98.2 F (36.8 C) (Oral)   Resp 17   Ht 5\' 7"  (1.702 m)   Wt 83.9 kg   SpO2 100%   BMI 28.96 kg/m   Intake/Output Summary (Last 24 hours) at 07/22/2021 0957 Last data filed at 07/22/2021 0742 Gross per 24 hour  Intake 985.36 ml  Output 1730 ml  Net -744.64 ml   Intake/Output: I/O last 3 completed shifts: In: 745.4 [P.O.:480; I.V.:181.1; IV Piggyback:84.3] Out: 1330 [Urine:1330]  Intake/Output this shift:  Total I/O In: 240 [P.O.:240] Out: 400 [Urine:400] Weight change:  Gen:NAD CVS: RRR Resp: CTA Abd: +BS, soft, NT/ND Ext: 1-2+ pretibial edema  Recent Labs  Lab 07/21/21 1139 07/21/21 1905 07/21/21 2119 07/22/21 0002 07/22/21 0501  NA 117*  --  118* 116* 120*  K 3.8  --   --  4.0  --   CL 87*  --   --  88*  --   CO2 20*  --   --  21*  --   GLUCOSE 296* 408*  --  357*  --   BUN 29*  --   --  27*  --   CREATININE 2.15*  --   --  2.31*  --   ALBUMIN 3.1*  --   --  2.6*  --   CALCIUM 9.2  --   --  8.5*  --   PHOS  --   --   --  3.0  --   AST 34  --   --   --   --   ALT 23  --   --   --   --    Liver Function Tests: Recent Labs  Lab 07/21/21 1139 07/22/21 0002  AST 34  --   ALT 23  --   ALKPHOS 55  --   BILITOT 1.4*  --   PROT 6.2*  --   ALBUMIN 3.1* 2.6*   Recent Labs  Lab 07/21/21 1139  LIPASE 19   No results for input(s): AMMONIA in the last 168 hours. CBC: Recent Labs  Lab 07/21/21 1139 07/22/21 0002  WBC 9.2 6.7  NEUTROABS 6.7  --   HGB 10.2* 8.6*  HCT 28.6* 23.3*  MCV 86.7 85.3  PLT 246 172   Cardiac Enzymes: No results for input(s): CKTOTAL, CKMB, CKMBINDEX, TROPONINI in the last 168 hours. CBG: Recent Labs  Lab 07/21/21 0926 07/21/21 1842 07/21/21 2013 07/21/21 2140 07/22/21 0636   GLUCAP 243* 434* 375* 355* 292*    Iron Studies:  Recent Labs    07/22/21 0002  IRON 81  TIBC 197*  FERRITIN 104   Studies/Results: DG Chest 2 View  Result Date: 07/21/2021 CLINICAL DATA:  Shortness of breath, dizziness, and chills for 3 days. EXAM: CHEST - 2 VIEW COMPARISON:  None. FINDINGS: The heart size and mediastinal contours are within normal limits. Mild pulmonary vascular congestion. Trace bilateral pleural effusions. No consolidation or pneumothorax. No acute osseous abnormality. IMPRESSION: 1. Mild pulmonary vascular congestion with trace bilateral pleural effusions. Electronically Signed   By: Titus Dubin M.D.   On: 07/21/2021 09:52    aspirin  324 mg Oral NOW   Or   aspirin  300 mg Rectal  NOW   aspirin EC  81 mg Oral Daily   atorvastatin  20 mg Oral QPC lunch   carvedilol  25 mg Oral BID WC   cloNIDine  0.2 mg Oral QPM   diltiazem  120 mg Oral Daily   ferrous sulfate  650 mg Oral QPC supper   fluticasone  1-2 spray Each Nare Daily   furosemide  40 mg Intravenous BID   gabapentin  300 mg Oral TID   hydrALAZINE  100 mg Oral TID   insulin aspart  0-5 Units Subcutaneous QHS   insulin aspart  0-9 Units Subcutaneous TID WC   insulin glargine-yfgn  30 Units Subcutaneous Daily   levothyroxine  137 mcg Oral Q0600   magnesium oxide  400 mg Oral QPC lunch   multivitamin with minerals  1 tablet Oral QPC lunch   omega-3 acid ethyl esters  2,000 mg Oral QHS   pantoprazole  40 mg Oral Q0600   vitamin B-12  1,000 mcg Oral Daily    BMET    Component Value Date/Time   NA 120 (L) 07/22/2021 0501   K 4.0 07/22/2021 0002   CL 88 (L) 07/22/2021 0002   CO2 21 (L) 07/22/2021 0002   GLUCOSE 357 (H) 07/22/2021 0002   BUN 27 (H) 07/22/2021 0002   CREATININE 2.31 (H) 07/22/2021 0002   CALCIUM 8.5 (L) 07/22/2021 0002   GFRNONAA 31 (L) 07/22/2021 0002   CBC    Component Value Date/Time   WBC 6.7 07/22/2021 0002   RBC 2.73 (L) 07/22/2021 0002   HGB 8.6 (L) 07/22/2021  0002   HCT 23.3 (L) 07/22/2021 0002   PLT 172 07/22/2021 0002   MCV 85.3 07/22/2021 0002   MCH 31.5 07/22/2021 0002   MCHC 36.9 (H) 07/22/2021 0002   RDW 11.8 07/22/2021 0002   LYMPHSABS 1.7 07/21/2021 1139   MONOABS 0.7 07/21/2021 1139   EOSABS 0.1 07/21/2021 1139   BASOSABS 0.0 07/21/2021 1139    Assessment/Plan:  Hyponatremia - hypervolemic on exam but also with elevated glucose level.  His last episode of hyponatremia responding to stopping lasix, however he is markedly volume overloaded today.  UNa, Uosm, Sosm, cortisol, and BNP ordered, however not yet drawn.  I agree with Dr. Virgina Jock regarding need to start IV lasix 40 mg bid given volume overload and hyponatremia but need to have urine obtained before giving IV lasix. Sosm was within normal limits at 275 raising the question of pseudohyponatremia however lipid panel was at goal.  Will repeat serum osm and also check SPEP/UPEP to r/o paraproteinemia as a contributor.  UNa was low at 15, Uosm was slightly elevated at 282 for sodium level but close to Sosm so may be due to CKD. Sodium levels improving with IV lasix  Follow sodium levels every 4 hours. Elevated troponins - concerning with chest and abdominal "burning sensation".  Cardiology following and want him to be transferred to [facility] for further cardiologic workup.  Started on aspirin and heparin.  Currently taking beta blocker. Volume overload - pt with pitting edema, JVD, and pleural effusions.  Agree with IV lasix as above as well as repeat ECHO to evaluate EF.   Ordered 24 hour urine for protein as he may have developed nephrotic syndrome from his diabetic nephropathy, although lipids WNL.  DM type 2 - hgb A1c 6.9% on 06/29/21 but elevated glucose today, further lowering serum sodium.  Management per primary service. Anemia of CKD stage IIIb - will check iron stores and  follow H/H. HTN - was elevated initially but now at goal.   AKI/CKD stage IIIb - Scr slightly above baseline.   Had been on olmesartan at time of last admission, but would hold ARB for now and follow I's/O's and daily Scr. Hypothyroidism - recent TSH 4.407 earlier this month.    Donetta Potts, MD [employer] [phone]

## 2021-07-23 LAB — RENAL FUNCTION PANEL
Albumin: 2.5 g/dL — ABNORMAL LOW (ref 3.5–5.0)
Anion gap: 5 (ref 5–15)
BUN: 30 mg/dL — ABNORMAL HIGH (ref 8–23)
CO2: 25 mmol/L (ref 22–32)
Calcium: 8.9 mg/dL (ref 8.9–10.3)
Chloride: 93 mmol/L — ABNORMAL LOW (ref 98–111)
Creatinine, Ser: 2.39 mg/dL — ABNORMAL HIGH (ref 0.61–1.24)
GFR, Estimated: 30 mL/min — ABNORMAL LOW (ref >60)
Glucose, Bld: 251 mg/dL — ABNORMAL HIGH (ref 70–99)
Phosphorus: 3.1 mg/dL (ref 2.5–4.6)
Potassium: 4.1 mmol/L (ref 3.5–5.1)
Sodium: 123 mmol/L — ABNORMAL LOW (ref 135–145)

## 2021-07-23 LAB — CBC
HCT: 24.6 % — ABNORMAL LOW (ref 39.0–52.0)
Hemoglobin: 8.7 g/dL — ABNORMAL LOW (ref 13.0–17.0)
MCH: 31 pg (ref 26.0–34.0)
MCHC: 35.4 g/dL (ref 30.0–36.0)
MCV: 87.5 fL (ref 80.0–100.0)
Platelets: 171 10*3/uL (ref 150–400)
RBC: 2.81 MIL/uL — ABNORMAL LOW (ref 4.22–5.81)
RDW: 12.3 % (ref 11.5–15.5)
WBC: 5.4 10*3/uL (ref 4.0–10.5)
nRBC: 0 % (ref 0.0–0.2)

## 2021-07-23 LAB — OSMOLALITY, URINE: Osmolality, Ur: 150 mOsm/kg — ABNORMAL LOW (ref 300–900)

## 2021-07-23 LAB — URINE CULTURE
Culture: NO GROWTH
Special Requests: NORMAL

## 2021-07-23 LAB — BASIC METABOLIC PANEL
Anion gap: 4 — ABNORMAL LOW (ref 5–15)
BUN: 29 mg/dL — ABNORMAL HIGH (ref 8–23)
CO2: 25 mmol/L (ref 22–32)
Calcium: 8.9 mg/dL (ref 8.9–10.3)
Chloride: 93 mmol/L — ABNORMAL LOW (ref 98–111)
Creatinine, Ser: 2.32 mg/dL — ABNORMAL HIGH (ref 0.61–1.24)
GFR, Estimated: 31 mL/min — ABNORMAL LOW (ref >60)
Glucose, Bld: 248 mg/dL — ABNORMAL HIGH (ref 70–99)
Potassium: 4 mmol/L (ref 3.5–5.1)
Sodium: 122 mmol/L — ABNORMAL LOW (ref 135–145)

## 2021-07-23 LAB — HEPARIN LEVEL (UNFRACTIONATED): Heparin Unfractionated: 0.72 IU/mL — ABNORMAL HIGH (ref 0.30–0.70)

## 2021-07-23 LAB — SODIUM
Sodium: 123 mmol/L — ABNORMAL LOW (ref 135–145)
Sodium: 126 mmol/L — ABNORMAL LOW (ref 135–145)
Sodium: 126 mmol/L — ABNORMAL LOW (ref 135–145)
Sodium: 128 mmol/L — ABNORMAL LOW (ref 135–145)

## 2021-07-23 LAB — CREATININE, URINE, RANDOM: Creatinine, Urine: 38.03 mg/dL

## 2021-07-23 LAB — GLUCOSE, CAPILLARY
Glucose-Capillary: 222 mg/dL — ABNORMAL HIGH (ref 70–99)
Glucose-Capillary: 296 mg/dL — ABNORMAL HIGH (ref 70–99)
Glucose-Capillary: 308 mg/dL — ABNORMAL HIGH (ref 70–99)
Glucose-Capillary: 175 mg/dL — ABNORMAL HIGH (ref 70–99)

## 2021-07-23 LAB — HEMOGLOBIN AND HEMATOCRIT, BLOOD
HCT: 22.5 % — ABNORMAL LOW (ref 39.0–52.0)
Hemoglobin: 7.9 g/dL — ABNORMAL LOW (ref 13.0–17.0)

## 2021-07-23 LAB — PHOSPHORUS: Phosphorus: 3 mg/dL (ref 2.5–4.6)

## 2021-07-23 LAB — MAGNESIUM: Magnesium: 2 mg/dL (ref 1.7–2.4)

## 2021-07-23 LAB — OSMOLALITY: Osmolality: 277 mOsm/kg (ref 275–295)

## 2021-07-23 LAB — SODIUM, URINE, RANDOM: Sodium, Ur: 15 mmol/L

## 2021-07-23 MED ORDER — HEPARIN SODIUM (PORCINE) 5000 UNIT/ML IJ SOLN
5000.0000 [IU] | Freq: Three times a day (TID) | INTRAMUSCULAR | Status: DC
  Administered 2021-07-23 – 2021-07-25 (×5): 5000 [IU] via SUBCUTANEOUS
  Filled 2021-07-23 (×5): qty 1

## 2021-07-23 MED ORDER — ISOSORB DINITRATE-HYDRALAZINE 20-37.5 MG PO TABS
1.0000 | ORAL_TABLET | Freq: Three times a day (TID) | ORAL | Status: DC
  Administered 2021-07-23 – 2021-07-25 (×6): 1 via ORAL
  Filled 2021-07-23 (×6): qty 1

## 2021-07-23 MED ORDER — INSULIN GLARGINE-YFGN 100 UNIT/ML ~~LOC~~ SOLN
33.0000 [IU] | Freq: Every day | SUBCUTANEOUS | Status: DC
  Administered 2021-07-24: 33 [IU] via SUBCUTANEOUS
  Filled 2021-07-23: qty 0.33

## 2021-07-23 NOTE — Progress Notes (Signed)
Inpatient Diabetes Program Recommendations  AACE/ADA: New Consensus Statement on Inpatient Glycemic Control   Target Ranges:  Prepandial:   less than 140 mg/dL      Peak postprandial:   less than 180 mg/dL (1-2 hours)      Critically ill patients:  140 - 180 mg/dL   Results for Tim Spencer, Tim Spencer (MRN [id number]) as of 07/23/2021 08:55  Ref. Range 07/22/2021 06:36 07/22/2021 10:05 07/22/2021 18:12 07/22/2021 21:17 07/23/2021 06:36  Glucose-Capillary Latest Ref Range: 70 - 99 mg/dL 292 (H) 118 (H) 242 (H) 233 (H) 222 (H)    Review of Glycemic Control  Diabetes history: DM2 Outpatient Diabetes medications: Toujeo 30 units daily, Humalog 10-15 units TID with meals Current orders for Inpatient glycemic control: Semglee 30 units daily, Novolog 0-9 units TID with meals, Novolog 0-5 units QHS   Inpatient Diabetes Program Recommendations:    Insulin: Please consider increasing Semglee to 33 units daily and ordering Novolog 5 units TID with meals for meal coverage if patient eats at least 50% of meals.  Thanks, Barnie Alderman, RN, MSN, CDE Diabetes Coordinator Inpatient Diabetes Program [phone] (Team Pager from 8am to 5pm)

## 2021-07-23 NOTE — Progress Notes (Signed)
Progress Note  Patient Name: Tim Spencer Date of Encounter: 07/23/2021  Attending physician: Shawna Clamp, MD Primary care provider: Lin Landsman, MD Primary Cardiologist: Dr. Vernell Leep  Subjective: Tim Spencer is a 60 y.o. male who was seen and examined at bedside Denies chest pain or precordial burning like sensation. Resting in bed comfortably. Accompanied by his wife.  Objective: Vital Signs in the last 24 hours: Temp:  [97.9 F (36.6 C)-98.7 F (37.1 C)] 98.1 F (36.7 C) (10/30 1247) Pulse Rate:  [60-72] 69 (10/30 1247) Resp:  [13-20] 16 (10/30 1247) BP: (135-160)/(59-78) 160/74 (10/30 1247) SpO2:  [98 %-100 %] 100 % (10/30 1247) Weight:  [82.7 kg] 82.7 kg (10/30 0419)  Intake/Output:  Intake/Output Summary (Last 24 hours) at 07/23/2021 1255 Last data filed at 07/23/2021 1243 Gross per 24 hour  Intake 780 ml  Output 3175 ml  Net -2395 ml    Net IO Since Admission: -3,739.64 mL [07/23/21 1255]  Weights:  Filed Weights   07/21/21 0925 07/22/21 0500 07/23/21 0419  Weight: 73 kg 83.9 kg 82.7 kg    Telemetry: Personally reviewed.  Normal sinus  Physical examination: PHYSICAL EXAM: Vitals with BMI 07/23/2021 07/23/2021 07/23/2021  Height - - -  Weight - - 182 lbs 5 oz  BMI - - 16.10  Systolic 960 454 098  Diastolic 74 59 68  Pulse 69 67 60    CONSTITUTIONAL: Well-developed and well-nourished. No acute distress.  SKIN: Skin is warm and dry. No rash noted. No cyanosis. No pallor. No jaundice HEAD: Normocephalic and atraumatic.  EYES: No scleral icterus MOUTH/THROAT: Moist oral membranes.  NECK: JVD present. No thyromegaly noted.  Left carotid bruit LYMPHATIC: No visible cervical adenopathy.  CHEST Normal respiratory effort. No intercostal retractions  LUNGS: Clear to auscultation bilaterally.  No stridor. No wheezes. No rales.  CARDIOVASCULAR: Regular, positive S1-S2, no murmurs rubs or gallops appreciated ABDOMINAL: Soft, nontender,  nondistended, positive bowel sounds in all 4 quadrants, no apparent ascites.  EXTREMITIES: + trace bilateral peripheral edema, warm to touch HEMATOLOGIC: No significant bruising NEUROLOGIC: Oriented to person, place, and time. Nonfocal. Normal muscle tone.  PSYCHIATRIC: Normal mood and affect. Normal behavior. Cooperative  Lab Results: Hematology Recent Labs  Lab 07/21/21 1139 07/22/21 0002 07/23/21 0203 07/23/21 0829  WBC 9.2 6.7  --  5.4  RBC 3.30* 2.73*  --  2.81*  HGB 10.2* 8.6* 7.9* 8.7*  HCT 28.6* 23.3* 22.5* 24.6*  MCV 86.7 85.3  --  87.5  MCH 30.9 31.5  --  31.0  MCHC 35.7 36.9*  --  35.4  RDW 11.9 11.8  --  12.3  PLT 246 172  --  171    Chemistry Recent Labs  Lab 07/21/21 1139 07/21/21 1905 07/21/21 2119 07/22/21 0002 07/22/21 0501 07/22/21 2212 07/23/21 0203 07/23/21 0829  NA 117*  --    < > 116*   < > 124* 123*  123*  122* 126*  K 3.8  --   --  4.0  --   --  4.1  4.0  --   CL 87*  --   --  88*  --   --  93*  93*  --   CO2 20*  --   --  21*  --   --  25  25  --   GLUCOSE 296* 408*  --  357*  --   --  251*  248*  --   BUN 29*  --   --  27*  --   --  30*  29*  --   CREATININE 2.15*  --   --  2.31*  --   --  2.39*  2.32*  --   CALCIUM 9.2  --   --  8.5*  --   --  8.9  8.9  --   PROT 6.2*  --   --   --   --   --   --   --   ALBUMIN 3.1*  --   --  2.6*  --   --  2.5*  --   AST 34  --   --   --   --   --   --   --   ALT 23  --   --   --   --   --   --   --   ALKPHOS 55  --   --   --   --   --   --   --   BILITOT 1.4*  --   --   --   --   --   --   --   GFRNONAA 34*  --   --  31*  --   --  30*  31*  --   ANIONGAP 10  --   --  7  --   --  5  4*  --    < > = values in this interval not displayed.     Cardiac Enzymes: Cardiac Panel (last 3 results) Recent Labs    07/21/21 1139 07/21/21 1517 07/21/21 1727  TROPONINIHS 822* 437* 581*    BNP (last 3 results) Recent Labs    06/28/21 1727 06/29/21 1521 07/21/21 1905  BNP 429.3* 530.5*  579.4*    ProBNP (last 3 results) No results for input(s): PROBNP in the last 8760 hours.   DDimer No results for input(s): DDIMER in the last 168 hours.   Hemoglobin A1c:  Lab Results  Component Value Date   HGBA1C 6.9 (H) 06/29/2021   MPG 151.33 06/29/2021    TSH  Recent Labs    06/30/21 0511  TSH 4.407    Lipid Panel     Component Value Date/Time   CHOL 134 07/22/2021 0501   TRIG 40 07/22/2021 0501   HDL 44 07/22/2021 0501   CHOLHDL 3.0 07/22/2021 0501   VLDL 8 07/22/2021 0501   LDLCALC 82 07/22/2021 0501    Imaging: ECHOCARDIOGRAM COMPLETE  Result Date: 07/22/2021    ECHOCARDIOGRAM REPORT   Patient Name:   Tim Spencer Date of Exam: 07/22/2021 Medical Rec #:  [id number]      Height:       67.0 in Accession #:    [id number]     Weight:       184.9 lb Date of Birth:  24-Dec-1960       BSA:          1.956 m Patient Age:    42 years       BP:           136/65 mmHg Patient Gender: M              HR:           66 bpm. Exam Location:  Inpatient Procedure: 2D Echo, Cardiac Doppler and Color Doppler Indications:    NSTEMI l21.4  History:        Patient has no prior history of Echocardiogram examinations.  Risk Factors:Hypertension and Diabetes. Chronic kidney disease,                 stage 3a, Elevated troponin.  Sonographer:    BW Referring Phys: [place]  1. Left ventricular ejection fraction, by estimation, is 55 to 60%. The left ventricle has normal function. The left ventricle has no regional wall motion abnormalities. There is mild left ventricular hypertrophy. Left ventricular diastolic parameters are consistent with Grade II diastolic dysfunction (pseudonormalization).  2. Right ventricular systolic function is normal. The right ventricular size is normal.  3. Left atrial size was mildly dilated.  4. The mitral valve is normal in structure. Trivial mitral valve regurgitation. No evidence of mitral stenosis.  5. The aortic valve is  tricuspid. Aortic valve regurgitation is not visualized. No aortic stenosis is present.  6. The inferior vena cava is normal in size with greater than 50% respiratory variability, suggesting right atrial pressure of 3 mmHg. Comparison(s): No prior Echocardiogram. FINDINGS  Left Ventricle: Left ventricular ejection fraction, by estimation, is 55 to 60%. The left ventricle has normal function. The left ventricle has no regional wall motion abnormalities. The left ventricular internal cavity size was normal in size. There is  mild left ventricular hypertrophy. Left ventricular diastolic parameters are consistent with Grade II diastolic dysfunction (pseudonormalization). Right Ventricle: The right ventricular size is normal. Right ventricular systolic function is normal. Left Atrium: Left atrial size was mildly dilated. Right Atrium: Right atrial size was normal in size. Pericardium: Trivial pericardial effusion is present. Mitral Valve: The mitral valve is normal in structure. Trivial mitral valve regurgitation. No evidence of mitral valve stenosis. Tricuspid Valve: The tricuspid valve is normal in structure. Tricuspid valve regurgitation is trivial. No evidence of tricuspid stenosis. Aortic Valve: The aortic valve is tricuspid. Aortic valve regurgitation is not visualized. No aortic stenosis is present. Pulmonic Valve: The pulmonic valve was normal in structure. Pulmonic valve regurgitation is not visualized. No evidence of pulmonic stenosis. Aorta: The aortic root is normal in size and structure. Venous: The inferior vena cava is normal in size with greater than 50% respiratory variability, suggesting right atrial pressure of 3 mmHg. IAS/Shunts: No atrial level shunt detected by color flow Doppler.  LEFT VENTRICLE PLAX 2D LVIDd:         4.40 cm   Diastology LVIDs:         2.80 cm   LV e' medial:    6.96 cm/s LV PW:         1.40 cm   LV E/e' medial:  16.2 LV IVS:        1.10 cm   LV e' lateral:   9.14 cm/s LVOT diam:      1.90 cm   LV E/e' lateral: 12.4 LV SV:         50 LV SV Index:   26 LVOT Area:     2.84 cm  RIGHT VENTRICLE RV S prime:     15.40 cm/s TAPSE (M-mode): 1.7 cm LEFT ATRIUM             Index        RIGHT ATRIUM           Index LA diam:        4.60 cm 2.35 cm/m   RA Area:     15.00 cm LA Vol (A2C):   50.5 ml 25.82 ml/m  RA Volume:   37.70 ml  19.28 ml/m LA Vol (A4C):  81.0 ml 41.41 ml/m LA Biplane Vol: 70.8 ml 36.20 ml/m  AORTIC VALVE LVOT Vmax:   80.30 cm/s LVOT Vmean:  59.300 cm/s LVOT VTI:    0.177 m  AORTA Ao Root diam: 3.20 cm MITRAL VALVE MV Area (PHT): 5.02 cm     SHUNTS MV Decel Time: 151 msec     Systemic VTI:  0.18 m MV E velocity: 113.00 cm/s  Systemic Diam: 1.90 cm MV A velocity: 57.00 cm/s MV E/A ratio:  1.98 Kirk Ruths MD Electronically signed by Kirk Ruths MD Signature Date/Time: 07/22/2021/3:57:23 PM    Final     Cardiac database: EKG 07/21/2021: Sinus rhythm 93 bpm Possible old anteroseptal infarct Nonspecific ST-T changes   Echocardiogram: 07/22/2021:  1. Left ventricular ejection fraction, by estimation, is 55 to 60%. The left ventricle has normal function. The left ventricle has no regional wall motion abnormalities. There is mild left ventricular hypertrophy.  Left ventricular diastolic parameters are consistent with Grade II diastolic dysfunction (pseudonormalization).   2. Right ventricular systolic function is normal. The right ventricular size is normal.   3. Left atrial size was mildly dilated.   4. The mitral valve is normal in structure. Trivial mitral valve regurgitation. No evidence of mitral stenosis.   5. The aortic valve is tricuspid. Aortic valve regurgitation is not visualized. No aortic stenosis is present.   6. The inferior vena cava is normal in size with greater than 50% respiratory variability, suggesting right atrial pressure of 3 mmHg.  Scheduled Meds:  aspirin EC  81 mg Oral Daily   atorvastatin  20 mg Oral QPC lunch   carvedilol  25 mg  Oral BID WC   cloNIDine  0.2 mg Oral QPM   diltiazem  120 mg Oral Daily   feeding supplement  237 mL Oral BID BM   ferrous sulfate  650 mg Oral QPC supper   fluticasone  1-2 spray Each Nare Daily   furosemide  20 mg Intravenous BID   gabapentin  300 mg Oral TID   heparin injection (subcutaneous)  5,000 Units Subcutaneous Q8H   insulin aspart  0-5 Units Subcutaneous QHS   insulin aspart  0-9 Units Subcutaneous TID WC   insulin glargine-yfgn  30 Units Subcutaneous Daily   isosorbide-hydrALAZINE  1 tablet Oral TID   levothyroxine  137 mcg Oral Q0600   magnesium oxide  400 mg Oral QPC lunch   multivitamin with minerals  1 tablet Oral QPC lunch   omega-3 acid ethyl esters  2,000 mg Oral QHS   pantoprazole  40 mg Oral Q0600   vitamin B-12  1,000 mcg Oral Daily    Continuous Infusions:  heparin 1,150 Units/hr (07/23/21 1001)    PRN Meds: acetaminophen, ALPRAZolam, alum & mag hydroxide-simeth **AND** lidocaine, nitroGLYCERIN, ondansetron (ZOFRAN) IV   IMPRESSION & RECOMMENDATIONS: Tim Spencer is a 60 y.o. male whose past medical history and cardiac risk factors include: Hypertension with baseline chronic kidney disease stage III, idiopathic progressive neuropathy, history of orthostatic hypertension, insulin-dependent diabetes mellitus.  Acute exacerbation of HFpEF, stage C, NYHA class II: Improving. On admission: shortness of breath, vascular congestion on chest x-ray, with bilateral lower extremity swelling and JVP. Continue with diuresis - nephrology following.  Echocardiogram: LVEF preserved and no RWMA, G2DD.  Currently on beta-blocker and calcium channel blockers, Lasix D/C Hydralazine and change to BiDil - CHF and anti-anginal component as well. Currently not on ACE inhibitor's/ARB/Arni due to renal function Currently not on Aldactone due to renal function. Hold  off on Farxiga given his GFR.  Continue to monitor Strict I's and O's and daily weights  Elevated  troponins: Most likely secondary to supply demand ischemia but underlying ischemia cannot be ruled out.   No active chest pain.  But 3 days prior he describes having chest burning involving his abdomen, anterior chest wall, and neck.  The discomfort is nonexertional and self-limited. High sensitive troponins peaked at 822. Echocardiogram: LVEF preserved and no RWMA, G2DD.  In the setting of no chest pain, no dynamic ECG changes, preserved LVEF and no RWMA would recommend outpatient ischemic workup given his AKI.  Complete IV heparin infusion for total of 48hrs - pharmacist  made aware.  Anti-anginal therapy includes: BB, CCB, nitro prn, and will change hydralazine to Bidil for the isosorbide component.  Continue aspirin and statin therapy.  Hyponatremia: Improving.  Most likely secondary to acute exacerbation of HFpEF leading to hypervolemic hyponatremia, pseudohyponatremia in the setting of hyperglycemia, and other etiologies currently being investigated by nephrology. Will defer to nephrology at this time.  Left carotid bruit: Noted on physical examination. Last carotid duplex September 2021 noted no significant stenosis. Recommend outpatient follow-up Currently on aspirin and statin therapy  Insulin-dependent diabetes mellitus type 2: Most recent hemoglobin A1c 6.9 Currently on statin therapy. ARB/ACE inhibitor's/Arni held secondary to renal function may consider prior to discharge depending on renal function. Educated on the importance of glycemic control and management of his other modifiable cardiovascular risk factors.  Anemia of chronic kidney disease stage IIIb: Currently being managed by primary team and nephrology.  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  For now cardiology will follow peripherally during his current hospitalization. Please call / reach out if change in symptoms or clinical status. Will  arrange outpatient follow up w/ his primary cardiologist Dr. Virgina Jock or myself in the next two weeks.  Thank you for allowing [place] to participate in the care of [place].  Patient's questions and concerns were addressed to his satisfaction. He voices understanding of the instructions provided during this encounter.   This note was created using a voice recognition software as a result there may be grammatical errors inadvertently enclosed that do not reflect the nature of this encounter. Every attempt is made to correct such errors.  Rex Kras, DO, [facility]. [place] Office: [phone] 07/23/2021, 12:55 PM

## 2021-07-23 NOTE — Progress Notes (Signed)
PROGRESS NOTE    Tim Spencer  TIR:[id number] DOB: [birth year] DOA: 07/21/2021 PCP: Lin Landsman, MD   Brief Narrative:  This 60 years old male with PMH significant for recent hospitalization 06/28/2021 - 07/01/2021 for hyponatremia secondary to diuretics, history of diabetes mellitus, CKD stage IIIa, hypertension, anemia, anxiety presented to the ED with 2-day history of burning sensation in the chest and abdomen, generalized weakness and dizziness associated with some nausea. Patient is found to have hyponatremia sodium 117, serum creatinine 2.15, high-sensitivity troponin noted to be elevated at 750> 822.  EKG shows prolonged PR interval. Patient is admitted for NSTEMI, hyponatremia, AKI on CKD, cardiology and nephrology is consulted.  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 EKG showed prolonged PR interval. Troponins 750> 885> trending down Echocardiogram shows LVEF 55 to 60%, no regional wall motion abnormalities. Cardiology consulted,  Patient does have CAD risk factors,  will need ischemic work-up at some point. Continue aspirin, heparin and statin for now. Continue beta-blockers, continue heparin for 48 hours. Recommend ischemic work-up once serum creatinine improves.  Acute diastolic CHF: Continue with diuresis. Echo :Preserved LVEF, no regional wall motion abnormalities Continue beta-blocker and calcium renal blockers and Lasix. DC  hydralazine and change to BiDil. Patient is not on ACE inhibitor due to AKI  Atypical chest pain: Patient complains of burning sensation in the upper chest Resolved with GI cocktail. Continue PPI daily  Severe hyponatremia: > Improving Patient recently admitted with hyponatremia which was felt to be secondary to diuretics. Patient reports he was told to take Lasix 20 mg every other day at home for pedal swelling He appears volume overloaded at this time with lower extremity edema. Cardiology and nephrology recommended to continue Lasix 20 mg twice daily for fluid overload.  Sodium 126 today. Patient is having good urine output, continue to monitor serum sodium.  Hypothyroidism: Continue levothyroxine.  Diabetes mellitus type 2: Hemoglobin A1c 6.5 Continue long-acting Semglee Regular insulin sliding scale.  Normochromic normocytic anemia: Could be secondary to CKD. Transfuse if hemoglobin drops below 7.  Essential hypertension: Continue clonidine, Coreg, diltiazem,. Cardiology recommended discontinue hydralazine and start BiDil.  Bilateral lower extremity edema: Continue Lasix 20 mg twice daily Could be hypoalbuminemia.  Hx. of Orthostatic Hypotension: Stable.  Monitor with BP meds.  AKI on CKD stage IIIa: Creatinine on admission 2.15, baseline creatinine 1.46 Could be due to prerenal azotemia secondary to volume overload in setting of diuretics. Continue IV diuretics.  Monitor serum creatinine. Monitoring daily weight.  Chronic constipation: Continue as needed Senokot.   DVT prophylaxis: Heparin Code Status: Full code. Family Communication: No family at bed side. Disposition Plan:   Status is: Inpatient  Remains inpatient appropriate because: Admitted for NSTEMI and hyponatremia  Anticipated discharge home in 1 to 2 days.   Consultants:  Nephrology Cardiology  Procedures: Antimicrobials:   Anti-infectives (From admission, onward)    None         Subjective: Patient was seen and examined at bedside.  Overnight events noted.   Patient reports feeling better.  He still has significant pedal edema.  Objective: Vitals:   07/23/21 0043 07/23/21 0419 07/23/21 0942 07/23/21 1247  BP: (!) 156/78 137/68 (!) 135/59 (!) 160/74  Pulse: 70 60 67 69  Resp: 16 20 18 16   Temp: 98.7 F (37.1 C) 98.5 F (36.9 C) 97.9 F (36.6 C) 98.1 F (36.7 C)  TempSrc: Oral Oral Oral Oral  SpO2: 100% 99% 100% 100%  Weight:  82.7 kg    Height:        Intake/Output Summary (Last 24 hours) at 07/23/2021 1403 Last data filed at 07/23/2021 1243 Gross per 24 hour  Intake 780 ml  Output 3175 ml  Net -2395 ml   Filed Weights   07/21/21 0925 07/22/21 0500 07/23/21 0419  Weight: 73 kg 83.9 kg 82.7 kg    Examination:  General exam: Appears comfortable, not in any acute distress.  Deconditioned. Respiratory system: Clear to auscultation. Respiratory effort normal. Cardiovascular system: S1-S2 heard, regular rate and rhythm, no murmur.   Gastrointestinal system: Abdomen is soft, nontender, nondistended, BS +. Central nervous system: Alert and oriented x3 . No focal neurological deficits. Extremities: EDEMA ++ , no cyanosis, no clubbing. Skin: No rashes, lesions or ulcers Psychiatry: Judgement and insight appear normal. Mood & affect appropriate.     Data Reviewed: I have personally reviewed following labs and imaging studies  CBC: Recent Labs  Lab 07/21/21 1139 07/22/21 0002 07/23/21 0203 07/23/21 0829  WBC 9.2 6.7  --  5.4  NEUTROABS 6.7  --   --   --   HGB 10.2* 8.6* 7.9* 8.7*  HCT 28.6* 23.3* 22.5* 24.6*  MCV 86.7 85.3  --  87.5  PLT 246 172  --  768   Basic Metabolic Panel: Recent Labs  Lab 07/21/21 1139 07/21/21 1905 07/21/21 2119 07/22/21 0002 07/22/21 0501 07/22/21 1802 07/22/21 2212 07/23/21 0203 07/23/21 0829 07/23/21 1216  NA 117*  --    < > 116*   < > 123* 124* 123*  123*  122* 126* 126*  K 3.8  --   --  4.0   --   --   --  4.1  4.0  --   --   CL 87*  --   --  88*  --   --   --  93*  93*  --   --   CO2 20*  --   --  21*  --   --   --  25  25  --   --   GLUCOSE 296* 408*  --  357*  --   --   --  251*  248*  --   --   BUN 29*  --   --  27*  --   --   --  30*  29*  --   --   CREATININE 2.15*  --   --  2.31*  --   --   --  2.39*  2.32*  --   --   CALCIUM 9.2  --   --  8.5*  --   --   --  8.9  8.9  --   --   MG 1.9  --   --   --   --   --   --  2.0  --   --   PHOS  --   --   --  3.0  --   --   --  3.1  3.0  --   --    < > = values in this interval not displayed.   GFR: Estimated Creatinine Clearance: 33.4 mL/min (A) (by C-G formula based on SCr of 2.39 mg/dL (H)). Liver Function Tests: Recent Labs  Lab 07/21/21 1139 07/22/21 0002 07/23/21 0203  AST 34  --   --   ALT 23  --   --   ALKPHOS 55  --   --   BILITOT 1.4*  --   --   PROT 6.2*  --   --   ALBUMIN 3.1* 2.6* 2.5*   Recent Labs  Lab 07/21/21 1139  LIPASE 19   No results for input(s): AMMONIA in the last 168 hours. Coagulation Profile: No results for input(s): INR, PROTIME in the last 168 hours. Cardiac Enzymes: No results for input(s): CKTOTAL, CKMB, CKMBINDEX, TROPONINI in the last 168 hours. BNP (last 3 results) No results for input(s): PROBNP in the last 8760 hours. HbA1C: No results for input(s): HGBA1C in the last 72 hours. CBG: Recent Labs  Lab 07/22/21 1005 07/22/21 1812 07/22/21 2117 07/23/21 0636 07/23/21 1246  GLUCAP 118* 242* 233* 222* 296*   Lipid Profile: Recent Labs    07/22/21 0501  CHOL 134  HDL 44  LDLCALC 82  TRIG 40  CHOLHDL 3.0   Thyroid Function Tests: No results for input(s): TSH, T4TOTAL, FREET4, T3FREE, THYROIDAB in the last 72 hours. Anemia Panel: Recent Labs    07/22/21 0002  VITAMINB12 683  FOLATE 35.0  FERRITIN 104  TIBC 197*  IRON 81   Sepsis Labs: No results for input(s): PROCALCITON, LATICACIDVEN in the last 168 hours.  Recent Results (from the past 240  hour(s))  Resp Panel by RT-PCR (Flu A&B, Covid) Nasopharyngeal Swab     Status: None   Collection Time: 07/21/21  3:49 PM   Specimen: Nasopharyngeal Swab; Nasopharyngeal(NP) swabs in vial transport medium  Result Value Ref Range Status   SARS Coronavirus 2 by RT PCR NEGATIVE NEGATIVE Final    Comment: (NOTE) SARS-CoV-2 target nucleic acids are NOT DETECTED.  The SARS-CoV-2 RNA is generally detectable in upper respiratory specimens during the acute phase of infection. The lowest concentration of SARS-CoV-2 viral copies this assay can detect is 138 copies/mL. A negative result does not preclude SARS-Cov-2 infection and should not be used as the sole basis for treatment or other patient management decisions. A negative result may occur with  improper specimen collection/handling, submission of specimen other than nasopharyngeal swab, presence of viral mutation(s) within the areas targeted by this assay, and inadequate number of viral copies(<138 copies/mL). A negative result must be combined with clinical observations, patient history, and epidemiological information. The expected result is Negative.  Fact Sheet for Patients:  EntrepreneurPulse.com.au  Fact Sheet for Healthcare Providers:  IncredibleEmployment.be  This test is no t yet approved or cleared by the [place] FDA and  has been authorized for detection and/or diagnosis of SARS-CoV-2 by FDA under an Emergency Use Authorization (EUA). This EUA will remain  in effect (meaning this test can be used) for the duration of the COVID-19 declaration under Section 564(b)(1) of the Act, 21 U.S.C.section 360bbb-3(b)(1), unless the authorization is terminated  or revoked sooner.       Influenza A by PCR NEGATIVE NEGATIVE Final   Influenza B by PCR NEGATIVE NEGATIVE Final    Comment: (NOTE) The Xpert Xpress SARS-CoV-2/FLU/RSV plus assay is intended as an aid in the diagnosis of influenza from  Nasopharyngeal swab  specimens and should not be used as a sole basis for treatment. Nasal washings and aspirates are unacceptable for Xpert Xpress SARS-CoV-2/FLU/RSV testing.  Fact Sheet for Patients: EntrepreneurPulse.com.au  Fact Sheet for Healthcare Providers: IncredibleEmployment.be  This test is not yet approved or cleared by the [place] FDA and has been authorized for detection and/or diagnosis of SARS-CoV-2 by FDA under an Emergency Use Authorization (EUA). This EUA will remain in effect (meaning this test can be used) for the duration of the COVID-19 declaration under Section 564(b)(1) of the Act, 21 U.S.C. section 360bbb-3(b)(1), unless the authorization is terminated or revoked.  Performed at [facility], [place] [address] [place], [place] [address]   Urine Culture     Status: None   Collection Time: 07/21/21  8:02 PM   Specimen: Urine, Clean Catch  Result Value Ref Range Status   Specimen Description URINE, CLEAN CATCH  Final   Special Requests Normal  Final   Culture   Final    NO GROWTH Performed at [facility], [place] [address] [place], [place] [address]    Report Status 07/23/2021 FINAL  Final    Radiology Studies: ECHOCARDIOGRAM COMPLETE  Result Date: 07/22/2021    ECHOCARDIOGRAM REPORT   Patient Name:   Tim Spencer Date of Exam: 07/22/2021 Medical Rec #:  [id number]      Height:       67.0 in Accession #:    [id number]     Weight:       184.9 lb Date of Birth:  10/06/1960       BSA:          1.956 m Patient Age:    68 years       BP:           136/65 mmHg Patient Gender: M              HR:           66 bpm. Exam Location:  Inpatient Procedure: 2D Echo, Cardiac Doppler and Color Doppler Indications:    NSTEMI l21.4  History:        Patient has no prior history of Echocardiogram examinations.                 Risk Factors:Hypertension and Diabetes. Chronic kidney disease,                 stage 3a,  Elevated troponin.  Sonographer:    BW Referring Phys: [place]  1. Left ventricular ejection fraction, by estimation, is 55 to 60%. The left ventricle has normal function. The left ventricle has no regional wall motion abnormalities. There is mild left ventricular hypertrophy. Left ventricular diastolic parameters are consistent with Grade II diastolic dysfunction (pseudonormalization).  2. Right ventricular systolic function is normal. The right ventricular size is normal.  3. Left atrial size was mildly dilated.  4. The mitral valve is normal in structure. Trivial mitral valve regurgitation. No evidence of mitral stenosis.  5. The aortic valve is tricuspid. Aortic valve regurgitation is not visualized. No aortic stenosis is present.  6. The inferior vena cava is normal in size with greater than 50% respiratory variability, suggesting right atrial pressure of 3 mmHg. Comparison(s): No prior Echocardiogram. FINDINGS  Left Ventricle: Left ventricular ejection fraction, by estimation, is 55 to 60%. The left ventricle has normal function. The left ventricle has no regional wall motion abnormalities. The left ventricular internal cavity size was normal  in size. There is  mild left ventricular hypertrophy. Left ventricular diastolic parameters are consistent with Grade II diastolic dysfunction (pseudonormalization). Right Ventricle: The right ventricular size is normal. Right ventricular systolic function is normal. Left Atrium: Left atrial size was mildly dilated. Right Atrium: Right atrial size was normal in size. Pericardium: Trivial pericardial effusion is present. Mitral Valve: The mitral valve is normal in structure. Trivial mitral valve regurgitation. No evidence of mitral valve stenosis. Tricuspid Valve: The tricuspid valve is normal in structure. Tricuspid valve regurgitation is trivial. No evidence of tricuspid stenosis. Aortic Valve: The aortic valve is tricuspid. Aortic valve  regurgitation is not visualized. No aortic stenosis is present. Pulmonic Valve: The pulmonic valve was normal in structure. Pulmonic valve regurgitation is not visualized. No evidence of pulmonic stenosis. Aorta: The aortic root is normal in size and structure. Venous: The inferior vena cava is normal in size with greater than 50% respiratory variability, suggesting right atrial pressure of 3 mmHg. IAS/Shunts: No atrial level shunt detected by color flow Doppler.  LEFT VENTRICLE PLAX 2D LVIDd:         4.40 cm   Diastology LVIDs:         2.80 cm   LV e' medial:    6.96 cm/s LV PW:         1.40 cm   LV E/e' medial:  16.2 LV IVS:        1.10 cm   LV e' lateral:   9.14 cm/s LVOT diam:     1.90 cm   LV E/e' lateral: 12.4 LV SV:         50 LV SV Index:   26 LVOT Area:     2.84 cm  RIGHT VENTRICLE RV S prime:     15.40 cm/s TAPSE (M-mode): 1.7 cm LEFT ATRIUM             Index        RIGHT ATRIUM           Index LA diam:        4.60 cm 2.35 cm/m   RA Area:     15.00 cm LA Vol (A2C):   50.5 ml 25.82 ml/m  RA Volume:   37.70 ml  19.28 ml/m LA Vol (A4C):   81.0 ml 41.41 ml/m LA Biplane Vol: 70.8 ml 36.20 ml/m  AORTIC VALVE LVOT Vmax:   80.30 cm/s LVOT Vmean:  59.300 cm/s LVOT VTI:    0.177 m  AORTA Ao Root diam: 3.20 cm MITRAL VALVE MV Area (PHT): 5.02 cm     SHUNTS MV Decel Time: 151 msec     Systemic VTI:  0.18 m MV E velocity: 113.00 cm/s  Systemic Diam: 1.90 cm MV A velocity: 57.00 cm/s MV E/A ratio:  1.98 Kirk Ruths MD Electronically signed by Kirk Ruths MD Signature Date/Time: 07/22/2021/3:57:23 PM    Final      Scheduled Meds:  aspirin EC  81 mg Oral Daily   atorvastatin  20 mg Oral QPC lunch   carvedilol  25 mg Oral BID WC   cloNIDine  0.2 mg Oral QPM   diltiazem  120 mg Oral Daily   feeding supplement  237 mL Oral BID BM   ferrous sulfate  650 mg Oral QPC supper   fluticasone  1-2 spray Each Nare Daily   furosemide  20 mg Intravenous BID   gabapentin  300 mg Oral TID   heparin injection  (subcutaneous)  5,000 Units Subcutaneous Q8H  insulin aspart  0-5 Units Subcutaneous QHS   insulin aspart  0-9 Units Subcutaneous TID WC   [START ON 07/24/2021] insulin glargine-yfgn  33 Units Subcutaneous Daily   isosorbide-hydrALAZINE  1 tablet Oral TID   levothyroxine  137 mcg Oral Q0600   magnesium oxide  400 mg Oral QPC lunch   multivitamin with minerals  1 tablet Oral QPC lunch   omega-3 acid ethyl esters  2,000 mg Oral QHS   pantoprazole  40 mg Oral Q0600   vitamin B-12  1,000 mcg Oral Daily   Continuous Infusions:  heparin 1,150 Units/hr (07/23/21 1001)     LOS: 2 days    Time spent: 25 mins    Ramone Gander, MD [facility]   If 7PM-7AM, please contact night-coverage

## 2021-07-23 NOTE — Progress Notes (Signed)
ANTICOAGULATION CONSULT NOTE - Follow Up Consult  Pharmacy Consult for heparin Indication:  r/o ACS  Labs: Recent Labs    07/21/21 1139 07/21/21 1517 07/21/21 1727 07/21/21 1905 07/22/21 0002 07/22/21 1027 07/23/21 0203 07/23/21 0829  HGB 10.2*  --   --   --  8.6*  --  7.9* 8.7*  HCT 28.6*  --   --   --  23.3*  --  22.5* 24.6*  PLT 246  --   --   --  172  --   --  171  HEPARINUNFRC  --   --   --    < > <0.10* 0.35  --  0.72*  CREATININE 2.15*  --   --   --  2.31*  --  2.39*  2.32*  --   TROPONINIHS 822* 437* 581*  --   --   --   --   --    < > = values in this interval not displayed.   Heparin Dosing weight: 73 kg  Assessment: 60yo male supratherapeutic at 0.72 on heparin 1250 units/hr for ACS. CBC WNL. No bleeding reported. Will continue to follow plans for ischemic work-up when Scr improves. Will decrease heparin now and recheck level in 8 hours.  Goal of Therapy:  Heparin level 0.3-0.7 units/ml   Plan:  Increase heparin to 1150 units/hr Daily CBC/heparin level  Thank you for allowing pharmacy to participate in this patient's care.  Reatha Harps, PharmD PGY1 Pharmacy Resident 07/23/2021 9:49 AM Check [email or web address] for unit specific pharmacy number

## 2021-07-23 NOTE — Progress Notes (Signed)
Patient ID: Lorenza Winkleman, male   DOB: April 16, 1961, 60 y.o.   MRN: [id number] S:Feels better today.   O:BP (!) 160/74 (BP Location: Right Arm)   Pulse 69   Temp 98.1 F (36.7 C) (Oral)   Resp 16   Ht 5\' 7"  (1.702 m)   Wt 82.7 kg   SpO2 100%   BMI 28.56 kg/m   Intake/Output Summary (Last 24 hours) at 07/23/2021 1250 Last data filed at 07/23/2021 1243 Gross per 24 hour  Intake 780 ml  Output 3175 ml  Net -2395 ml   Intake/Output: I/O last 3 completed shifts: In: 1220.9 [P.O.:1020; I.V.:116.7; IV Piggyback:84.3] Out: 4755 [Urine:4755]  Intake/Output this shift:  Total I/O In: 480 [P.O.:480] Out: 750 [Urine:750] Weight change: 9.671 kg Gen:NAD CVS:RRR Resp:CTA Abd:+BS, soft, NT/ND Ext:1+ pretibial edema bilaterally  Recent Labs  Lab 07/21/21 1139 07/21/21 1905 07/21/21 2119 07/22/21 0002 07/22/21 0501 07/22/21 1027 07/22/21 1218 07/22/21 1802 07/22/21 2212 07/23/21 0203 07/23/21 0829  NA 117*  --    < > 116* 120* 121* 122* 123* 124* 123*  123*  122* 126*  K 3.8  --   --  4.0  --   --   --   --   --  4.1  4.0  --   CL 87*  --   --  88*  --   --   --   --   --  93*  93*  --   CO2 20*  --   --  21*  --   --   --   --   --  25  25  --   GLUCOSE 296* 408*  --  357*  --   --   --   --   --  251*  248*  --   BUN 29*  --   --  27*  --   --   --   --   --  30*  29*  --   CREATININE 2.15*  --   --  2.31*  --   --   --   --   --  2.39*  2.32*  --   ALBUMIN 3.1*  --   --  2.6*  --   --   --   --   --  2.5*  --   CALCIUM 9.2  --   --  8.5*  --   --   --   --   --  8.9  8.9  --   PHOS  --   --   --  3.0  --   --   --   --   --  3.1  3.0  --   AST 34  --   --   --   --   --   --   --   --   --   --   ALT 23  --   --   --   --   --   --   --   --   --   --    < > = values in this interval not displayed.   Liver Function Tests: Recent Labs  Lab 07/21/21 1139 07/22/21 0002 07/23/21 0203  AST 34  --   --   ALT 23  --   --   ALKPHOS 55  --   --   BILITOT 1.4*  --    --   PROT 6.2*  --   --  ALBUMIN 3.1* 2.6* 2.5*   Recent Labs  Lab 07/21/21 1139  LIPASE 19   No results for input(s): AMMONIA in the last 168 hours. CBC: Recent Labs  Lab 07/21/21 1139 07/22/21 0002 07/23/21 0203 07/23/21 0829  WBC 9.2 6.7  --  5.4  NEUTROABS 6.7  --   --   --   HGB 10.2* 8.6* 7.9* 8.7*  HCT 28.6* 23.3* 22.5* 24.6*  MCV 86.7 85.3  --  87.5  PLT 246 172  --  171   Cardiac Enzymes: No results for input(s): CKTOTAL, CKMB, CKMBINDEX, TROPONINI in the last 168 hours. CBG: Recent Labs  Lab 07/22/21 0636 07/22/21 1005 07/22/21 1812 07/22/21 2117 07/23/21 0636  GLUCAP 292* 118* 242* 233* 222*    Iron Studies:  Recent Labs    07/22/21 0002  IRON 81  TIBC 197*  FERRITIN 104   Studies/Results: ECHOCARDIOGRAM COMPLETE  Result Date: 07/22/2021    ECHOCARDIOGRAM REPORT   Patient Name:   VIHAAN GLOSS Date of Exam: 07/22/2021 Medical Rec #:  [id number]      Height:       67.0 in Accession #:    [id number]     Weight:       184.9 lb Date of Birth:  Jun 13, 1961       BSA:          1.956 m Patient Age:    75 years       BP:           136/65 mmHg Patient Gender: M              HR:           66 bpm. Exam Location:  Inpatient Procedure: 2D Echo, Cardiac Doppler and Color Doppler Indications:    NSTEMI l21.4  History:        Patient has no prior history of Echocardiogram examinations.                 Risk Factors:Hypertension and Diabetes. Chronic kidney disease,                 stage 3a, Elevated troponin.  Sonographer:    BW Referring Phys: [place]  1. Left ventricular ejection fraction, by estimation, is 55 to 60%. The left ventricle has normal function. The left ventricle has no regional wall motion abnormalities. There is mild left ventricular hypertrophy. Left ventricular diastolic parameters are consistent with Grade II diastolic dysfunction (pseudonormalization).  2. Right ventricular systolic function is normal. The right ventricular  size is normal.  3. Left atrial size was mildly dilated.  4. The mitral valve is normal in structure. Trivial mitral valve regurgitation. No evidence of mitral stenosis.  5. The aortic valve is tricuspid. Aortic valve regurgitation is not visualized. No aortic stenosis is present.  6. The inferior vena cava is normal in size with greater than 50% respiratory variability, suggesting right atrial pressure of 3 mmHg. Comparison(s): No prior Echocardiogram. FINDINGS  Left Ventricle: Left ventricular ejection fraction, by estimation, is 55 to 60%. The left ventricle has normal function. The left ventricle has no regional wall motion abnormalities. The left ventricular internal cavity size was normal in size. There is  mild left ventricular hypertrophy. Left ventricular diastolic parameters are consistent with Grade II diastolic dysfunction (pseudonormalization). Right Ventricle: The right ventricular size is normal. Right ventricular systolic function is normal. Left Atrium: Left atrial size was mildly dilated. Right Atrium: Right atrial size was normal  in size. Pericardium: Trivial pericardial effusion is present. Mitral Valve: The mitral valve is normal in structure. Trivial mitral valve regurgitation. No evidence of mitral valve stenosis. Tricuspid Valve: The tricuspid valve is normal in structure. Tricuspid valve regurgitation is trivial. No evidence of tricuspid stenosis. Aortic Valve: The aortic valve is tricuspid. Aortic valve regurgitation is not visualized. No aortic stenosis is present. Pulmonic Valve: The pulmonic valve was normal in structure. Pulmonic valve regurgitation is not visualized. No evidence of pulmonic stenosis. Aorta: The aortic root is normal in size and structure. Venous: The inferior vena cava is normal in size with greater than 50% respiratory variability, suggesting right atrial pressure of 3 mmHg. IAS/Shunts: No atrial level shunt detected by color flow Doppler.  LEFT VENTRICLE PLAX 2D  LVIDd:         4.40 cm   Diastology LVIDs:         2.80 cm   LV e' medial:    6.96 cm/s LV PW:         1.40 cm   LV E/e' medial:  16.2 LV IVS:        1.10 cm   LV e' lateral:   9.14 cm/s LVOT diam:     1.90 cm   LV E/e' lateral: 12.4 LV SV:         50 LV SV Index:   26 LVOT Area:     2.84 cm  RIGHT VENTRICLE RV S prime:     15.40 cm/s TAPSE (M-mode): 1.7 cm LEFT ATRIUM             Index        RIGHT ATRIUM           Index LA diam:        4.60 cm 2.35 cm/m   RA Area:     15.00 cm LA Vol (A2C):   50.5 ml 25.82 ml/m  RA Volume:   37.70 ml  19.28 ml/m LA Vol (A4C):   81.0 ml 41.41 ml/m LA Biplane Vol: 70.8 ml 36.20 ml/m  AORTIC VALVE LVOT Vmax:   80.30 cm/s LVOT Vmean:  59.300 cm/s LVOT VTI:    0.177 m  AORTA Ao Root diam: 3.20 cm MITRAL VALVE MV Area (PHT): 5.02 cm     SHUNTS MV Decel Time: 151 msec     Systemic VTI:  0.18 m MV E velocity: 113.00 cm/s  Systemic Diam: 1.90 cm MV A velocity: 57.00 cm/s MV E/A ratio:  1.98 Kirk Ruths MD Electronically signed by Kirk Ruths MD Signature Date/Time: 07/22/2021/3:57:23 PM    Final     aspirin EC  81 mg Oral Daily   atorvastatin  20 mg Oral QPC lunch   carvedilol  25 mg Oral BID WC   cloNIDine  0.2 mg Oral QPM   diltiazem  120 mg Oral Daily   feeding supplement  237 mL Oral BID BM   ferrous sulfate  650 mg Oral QPC supper   fluticasone  1-2 spray Each Nare Daily   furosemide  20 mg Intravenous BID   gabapentin  300 mg Oral TID   heparin injection (subcutaneous)  5,000 Units Subcutaneous Q8H   insulin aspart  0-5 Units Subcutaneous QHS   insulin aspart  0-9 Units Subcutaneous TID WC   insulin glargine-yfgn  30 Units Subcutaneous Daily   isosorbide-hydrALAZINE  1 tablet Oral TID   levothyroxine  137 mcg Oral Q0600   magnesium oxide  400 mg Oral QPC lunch  multivitamin with minerals  1 tablet Oral QPC lunch   omega-3 acid ethyl esters  2,000 mg Oral QHS   pantoprazole  40 mg Oral Q0600   vitamin B-12  1,000 mcg Oral Daily    BMET     Component Value Date/Time   NA 126 (L) 07/23/2021 0829   K 4.1 07/23/2021 0203   K 4.0 07/23/2021 0203   CL 93 (L) 07/23/2021 0203   CL 93 (L) 07/23/2021 0203   CO2 25 07/23/2021 0203   CO2 25 07/23/2021 0203   GLUCOSE 251 (H) 07/23/2021 0203   GLUCOSE 248 (H) 07/23/2021 0203   BUN 30 (H) 07/23/2021 0203   BUN 29 (H) 07/23/2021 0203   CREATININE 2.39 (H) 07/23/2021 0203   CREATININE 2.32 (H) 07/23/2021 0203   CALCIUM 8.9 07/23/2021 0203   CALCIUM 8.9 07/23/2021 0203   GFRNONAA 30 (L) 07/23/2021 0203   GFRNONAA 31 (L) 07/23/2021 0203   CBC    Component Value Date/Time   WBC 5.4 07/23/2021 0829   RBC 2.81 (L) 07/23/2021 0829   HGB 8.7 (L) 07/23/2021 0829   HCT 24.6 (L) 07/23/2021 0829   PLT 171 07/23/2021 0829   MCV 87.5 07/23/2021 0829   MCH 31.0 07/23/2021 0829   MCHC 35.4 07/23/2021 0829   RDW 12.3 07/23/2021 0829   LYMPHSABS 1.7 07/21/2021 1139   MONOABS 0.7 07/21/2021 1139   EOSABS 0.1 07/21/2021 1139   BASOSABS 0.0 07/21/2021 1139     Assessment/Plan:  Hyponatremia - hypervolemic on exam but also with elevated glucose level.  His last episode of hyponatremia responding to stopping lasix, however he is markedly volume overloaded today.  UNa, Uosm, Sosm, cortisol, and BNP ordered, however not yet drawn.  I agree with Dr. Virgina Jock regarding need to start IV lasix 40 mg bid given volume overload and hyponatremia but need to have urine obtained before giving IV lasix. Sosm was within normal limits at 275 raising the question of pseudohyponatremia however lipid panel was at goal.  Will repeat serum osm and also check SPEP/UPEP to r/o paraproteinemia as a contributor.  UNa was low at 15, Uosm was slightly elevated at 282 for sodium level but close to Sosm so may be due to CKD. Sodium levels improving with IV lasix and now 126 Follow sodium levels every 12 hours. Continue with iv lasix 20 mg bid. Elevated troponins - concerning with chest and abdominal "burning sensation".   Cardiology following and want him to be transferred to [facility] for further cardiologic workup.  Started on aspirin and heparin.  Currently taking beta blocker.  ECHO without change in EF.  Await stress test per Cardiology. Volume overload - pt with pitting edema, JVD, and pleural effusions.  Agree with IV lasix as above as well as repeat ECHO to evaluate EF.   Ordered 24 hour urine for protein as he may have developed nephrotic syndrome from his diabetic nephropathy, although lipids WNL.  DM type 2 - hgb A1c 6.9% on 06/29/21 but elevated glucose today, further lowering serum sodium.  Management per primary service. Anemia of CKD stage IIIb - will check iron stores and follow H/H. HTN - was elevated initially but now at goal.   AKI/CKD stage IIIb - Scr slightly above baseline.  Had been on olmesartan at time of last admission, but would hold ARB for now and follow I's/O's and daily Scr. Hypothyroidism - recent TSH 4.407 earlier this month.  Donetta Potts, MD [employer] [phone]

## 2021-07-24 LAB — RPR: RPR Ser Ql: NONREACTIVE

## 2021-07-24 LAB — HEPATITIS PANEL, ACUTE
HCV Ab: NONREACTIVE
Hep A IgM: NONREACTIVE
Hep B C IgM: NONREACTIVE
Hepatitis B Surface Ag: NONREACTIVE

## 2021-07-24 LAB — CBC
HCT: 22.8 % — ABNORMAL LOW (ref 39.0–52.0)
Hemoglobin: 7.8 g/dL — ABNORMAL LOW (ref 13.0–17.0)
MCH: 30.7 pg (ref 26.0–34.0)
MCHC: 34.2 g/dL (ref 30.0–36.0)
MCV: 89.8 fL (ref 80.0–100.0)
Platelets: 167 10*3/uL (ref 150–400)
RBC: 2.54 MIL/uL — ABNORMAL LOW (ref 4.22–5.81)
RDW: 12.6 % (ref 11.5–15.5)
WBC: 4.1 10*3/uL (ref 4.0–10.5)
nRBC: 0 % (ref 0.0–0.2)

## 2021-07-24 LAB — PROTEIN, URINE, 24 HOUR
Collection Interval-UPROT: 24 hours
Protein, 24H Urine: 4995 mg/d — ABNORMAL HIGH (ref 50–100)
Protein, Urine: 111 mg/dL
Urine Total Volume-UPROT: 4500 mL

## 2021-07-24 LAB — GLUCOSE, CAPILLARY
Glucose-Capillary: 259 mg/dL — ABNORMAL HIGH (ref 70–99)
Glucose-Capillary: 255 mg/dL — ABNORMAL HIGH (ref 70–99)
Glucose-Capillary: 319 mg/dL — ABNORMAL HIGH (ref 70–99)
Glucose-Capillary: 337 mg/dL — ABNORMAL HIGH (ref 70–99)

## 2021-07-24 LAB — RENAL FUNCTION PANEL
Albumin: 2.5 g/dL — ABNORMAL LOW (ref 3.5–5.0)
Anion gap: 3 — ABNORMAL LOW (ref 5–15)
BUN: 28 mg/dL — ABNORMAL HIGH (ref 8–23)
CO2: 27 mmol/L (ref 22–32)
Calcium: 9.3 mg/dL (ref 8.9–10.3)
Chloride: 98 mmol/L (ref 98–111)
Creatinine, Ser: 2.22 mg/dL — ABNORMAL HIGH (ref 0.61–1.24)
GFR, Estimated: 33 mL/min — ABNORMAL LOW (ref >60)
Glucose, Bld: 281 mg/dL — ABNORMAL HIGH (ref 70–99)
Phosphorus: 3.4 mg/dL (ref 2.5–4.6)
Potassium: 4.4 mmol/L (ref 3.5–5.1)
Sodium: 128 mmol/L — ABNORMAL LOW (ref 135–145)

## 2021-07-24 LAB — KAPPA/LAMBDA LIGHT CHAINS
Kappa free light chain: 61.3 mg/L — ABNORMAL HIGH (ref 3.3–19.4)
Kappa, lambda light chain ratio: 2.1 — ABNORMAL HIGH (ref 0.26–1.65)
Lambda free light chains: 29.2 mg/L — ABNORMAL HIGH (ref 5.7–26.3)

## 2021-07-24 MED ORDER — INSULIN GLARGINE-YFGN 100 UNIT/ML ~~LOC~~ SOLN
35.0000 [IU] | Freq: Every day | SUBCUTANEOUS | Status: DC
  Filled 2021-07-24: qty 0.35

## 2021-07-24 MED ORDER — LACTULOSE 10 GM/15ML PO SOLN
30.0000 g | Freq: Every day | ORAL | Status: DC | PRN
  Administered 2021-07-24 – 2021-07-25 (×2): 30 g via ORAL
  Filled 2021-07-24 (×4): qty 45

## 2021-07-24 MED ORDER — FUROSEMIDE 10 MG/ML IJ SOLN
20.0000 mg | Freq: Two times a day (BID) | INTRAMUSCULAR | Status: DC
  Administered 2021-07-24 – 2021-07-25 (×3): 20 mg via INTRAVENOUS
  Filled 2021-07-24 (×3): qty 2

## 2021-07-24 NOTE — Progress Notes (Addendum)
Nephrology Follow-Up Consult note   Assessment/Recommendations: Tim Spencer is a/an 60 y.o. male with a past medical history significant for DM2, CKD 3a/b, HTN, admitted for hyponatremia, AKI and volume overload.     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 Posey Pronto in clinic. Felt to have DKD with UPC in May of 2021 nearly 5g.  Poorly controlled diabetes in the past.  Now w/ volume overload w/ fairly low albumin at 2.5. Some of his Crt elevation may be progression of his CKD (BL 1.5 now Crt 2.2). UPC here ~7g. 24 urine underway.  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  -Avoid nephrotoxic medications including NSAIDs and Vanc/Zosyn combo -Currently no indication for HD -holding SGLT2i and ARB for now  Hyponatremia: Has been an issue outpatient as well.  Likely related to volume overload.  Has been improving with diuresis.  Sodium corrects to around 131 today.  Elevated Tropinin: cardiology following. Felt to be demand  Volume overload/CHF exacerbation: severe proteinuria could be playing a role. Work up as above and continue with diuresis  Uncontrolled Diabetes Mellitus Type 2 with Hyperglycemia per primary  HTN: continue current meds. At goal.  Anemia: hgb low at 7.8. Iron replete with sat of 41. Transfuse as needed.   Recommendations conveyed to primary service.    [name] 07/24/2021 12:59 PM  ___________________________________________________________  CC: Volume overload, AKI on CKD  Interval History/Subjective: Patient doing okay today.  A little bit frustrated and  felt unwell because he did not sleep very good last night because he received Lasix fairly late in the evening.  Appetite is good.  Feels like edema is overall improved.  Denies nausea or vomiting   Medications:  Current Facility-Administered Medications  Medication Dose Route Frequency Provider Last Rate Last Admin   acetaminophen (TYLENOL) tablet 650 mg  650 mg Oral Q4H PRN Eugenie Filler, MD   650 mg at 07/22/21 1733   ALPRAZolam Duanne Moron) tablet 0.25 mg  0.25 mg Oral BID PRN Eugenie Filler, MD   0.25 mg at 07/22/21 1740   alum & mag hydroxide-simeth (MAALOX/MYLANTA) 200-200-20 MG/5ML suspension 30 mL  30 mL Oral Q6H PRN Eugenie Filler, MD       And   lidocaine (XYLOCAINE) 2 % viscous mouth solution 15 mL  15 mL Oral Q6H PRN Eugenie Filler, MD   15 mL at 07/21/21 2319   aspirin EC tablet 81 mg  81 mg Oral Daily Eugenie Filler, MD   81 mg at 07/24/21 0900   atorvastatin (LIPITOR) tablet 20 mg  20 mg Oral QPC lunch Eugenie Filler, MD   20 mg at 07/23/21 1255   carvedilol (COREG) tablet 25 mg  25 mg Oral BID WC Eugenie Filler, MD   25 mg at 07/24/21 0900   cloNIDine (CATAPRES) tablet 0.2 mg  0.2 mg Oral QPM Eugenie Filler, MD   0.2 mg at 07/23/21 2100   diltiazem (CARDIZEM CD) 24 hr capsule 120 mg  120 mg Oral Daily Eugenie Filler, MD   120 mg at 07/24/21 0900  feeding supplement (ENSURE ENLIVE / ENSURE PLUS) liquid 237 mL  237 mL Oral BID BM Shawna Clamp, MD   237 mL at 07/24/21 0900   ferrous sulfate tablet 650 mg  650 mg Oral QPC supper Eugenie Filler, MD   650 mg at 07/23/21 1750   fluticasone (FLONASE) 50 MCG/ACT nasal spray 1-2 spray  1-2 spray Each Nare Daily Etta Quill, DO   2 spray at 07/24/21 0900   furosemide (LASIX) injection 20 mg  20 mg Intravenous BID Reesa Chew, MD   20 mg at 07/24/21 0859   gabapentin (NEURONTIN) capsule 300 mg  300 mg Oral TID Eugenie Filler, MD   300 mg at 07/24/21 0900   heparin injection 5,000 Units   5,000 Units Subcutaneous Q8H Ursula Beath, RPH   5,000 Units at 07/24/21 9678   insulin aspart (novoLOG) injection 0-5 Units  0-5 Units Subcutaneous QHS Eugenie Filler, MD   2 Units at 07/22/21 2157   insulin aspart (novoLOG) injection 0-9 Units  0-9 Units Subcutaneous TID WC Eugenie Filler, MD   3 Units at 07/24/21 1247   insulin glargine-yfgn (SEMGLEE) injection 33 Units  33 Units Subcutaneous Daily Shawna Clamp, MD   33 Units at 07/24/21 0922   isosorbide-hydrALAZINE (BIDIL) 20-37.5 MG per tablet 1 tablet  1 tablet Oral TID Tolia, Sunit, DO   1 tablet at 07/24/21 0900   lactulose (CHRONULAC) 10 GM/15ML solution 30 g  30 g Oral Daily PRN Shawna Clamp, MD   30 g at 07/24/21 1039   levothyroxine (SYNTHROID) tablet 137 mcg  137 mcg Oral Q0600 Eugenie Filler, MD   137 mcg at 07/24/21 9381   magnesium oxide (MAG-OX) tablet 400 mg  400 mg Oral QPC lunch Eugenie Filler, MD   400 mg at 07/23/21 1256   multivitamin with minerals tablet 1 tablet  1 tablet Oral QPC lunch Eugenie Filler, MD   1 tablet at 07/23/21 1255   nitroGLYCERIN (NITROSTAT) SL tablet 0.4 mg  0.4 mg Sublingual Q5 Min x 3 PRN Eugenie Filler, MD       omega-3 acid ethyl esters (LOVAZA) capsule 2,000 mg  2,000 mg Oral QHS Eugenie Filler, MD   2,000 mg at 07/23/21 2103   ondansetron [facility]) injection 4 mg  4 mg Intravenous Q6H PRN Eugenie Filler, MD   4 mg at 07/21/21 2134   pantoprazole (PROTONIX) EC tablet 40 mg  40 mg Oral Q0600 Eugenie Filler, MD   40 mg at 07/24/21 0175   vitamin B-12 (CYANOCOBALAMIN) tablet 1,000 mcg  1,000 mcg Oral Daily Eugenie Filler, MD   1,000 mcg at 07/24/21 0900      Review of Systems: 10 systems reviewed and negative except per interval history/subjective  Physical Exam: Vitals:   07/24/21 0425 07/24/21 0902  BP: (!) 147/67 (!) 147/94  Pulse: 60 63  Resp: 16 18  Temp: 97.7 F (36.5 C) 98.2 F (36.8 C)  SpO2: 100% 100%   Total I/O In: -  Out: 550  [Urine:550]  Intake/Output Summary (Last 24 hours) at 07/24/2021 1259 Last data filed at 07/24/2021 0709 Gross per 24 hour  Intake --  Output 2625 ml  Net -2625 ml   Constitutional: well-appearing, no acute distress ENMT: ears and nose without scars or lesions, MMM CV: normal rate, 1+ edema in the ble Respiratory: bilateral chest rise, normal work of breathing Gastrointestinal: soft, non-tender, no palpable masses or hernias  Skin: no visible lesions or rashes Psych: alert, judgement/insight appropriate, appropriate mood and affect   Test Results I personally reviewed new and old clinical labs and radiology tests Lab Results  Component Value Date   NA 128 (L) 07/24/2021   K 4.4 07/24/2021   CL 98 07/24/2021   CO2 27 07/24/2021   BUN 28 (H) 07/24/2021   CREATININE 2.22 (H) 07/24/2021   CALCIUM 9.3 07/24/2021   ALBUMIN 2.5 (L) 07/24/2021   PHOS 3.4 07/24/2021

## 2021-07-24 NOTE — Progress Notes (Addendum)
PROGRESS NOTE    Tim Spencer  TIR:[id number] DOB: 10/10/1960 DOA: 07/21/2021 PCP: Lin Landsman, MD   Brief Narrative:  This 60 years old male with PMH significant for recent hospitalization 06/28/2021 - 07/01/2021 for hyponatremia secondary to diuretics, history of diabetes mellitus, CKD stage IIIa, hypertension, anemia, anxiety presented to the ED with 2-day history of burning sensation in the chest and abdomen, generalized weakness and dizziness associated with some nausea. Patient is found to have hyponatremia sodium 117, serum creatinine 2.15, high-sensitivity troponin noted to be elevated at 750> 822.  EKG shows prolonged PR interval. Patient is admitted for NSTEMI, hyponatremia, AKI on CKD, cardiology and nephrology is consulted.  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 It appears like demand ischemia as compared to active ischemia EKG showed prolonged PR interval. Troponins 750> 885> trending down Echocardiogram shows LVEF 55 to 60%, no regional wall motion abnormalities. Cardiology consulted,  Patient does have CAD risk factors,  will need ischemic work-up at some point. Continue aspirin, heparin and statin for now. Continue beta-blockers, continue heparin for 48 hours. Recommend ischemic work-up once serum creatinine improves/ outpatient.  Acute diastolic CHF: Continue with diuresis. Echo :Preserved LVEF, no regional wall motion  abnormalities Continue beta-blocker and calcium renal blockers and Lasix. DC hydralazine and change to BiDil. Patient is not on ACE inhibitor due to AKI  Atypical chest pain:  > Resolved. Patient complains of burning sensation in the upper chest Resolved with GI cocktail. Continue PPI daily  Severe hyponatremia: > Improving Patient recently admitted with hyponatremia which was felt to be secondary to diuretics. Patient reports he was told to take Lasix 20 mg every other day at home for pedal swelling He presented  volume overloaded at this time with lower extremity edema. Cardiology and nephrology recommended to continue Lasix 20 mg twice daily for fluid overload.  Sodium 128 today. Patient is having good urine output, continue to monitor serum sodium.  Hypothyroidism: Continue levothyroxine.  Diabetes mellitus type 2: Hemoglobin A1c 6.5 Continue long-acting Semglee 35 units Regular insulin sliding scale.  Normochromic normocytic anemia: Could be secondary to CKD. Transfuse if hemoglobin drops below 7.  Essential hypertension: Continue Clonidine, Coreg, diltiazem,. Cardiology recommended discontinue hydralazine and start BiDil.  Bilateral lower extremity edema: Continue Lasix 20 mg twice daily Could be hypoalbuminemia.  Hx. of Orthostatic Hypotension: Stable.  Monitor with BP meds.  AKI on CKD stage IIIa: Creatinine on admission 2.15, baseline creatinine 1.46 Could be due to prerenal azotemia secondary to volume overload in setting of diuretics. He may have progression of the CKD stage III, nephrology is considering renal biopsy once 24-hour urine collection results. Continue IV diuretics.  Monitor serum creatinine. Monitoring daily weight.  Chronic constipation: Continue as needed Senokot.   DVT prophylaxis: Heparin Code Status: Full code. Family Communication: No family at bed side. Disposition Plan:   Status is: Inpatient  Remains inpatient appropriate  because: Admitted for NSTEMI and hyponatremia  Anticipated discharge home in 1 to  2 days.   Consultants:  Nephrology Cardiology  Procedures: Antimicrobials:   Anti-infectives (From admission, onward)    None        Subjective: Patient was seen and examined at bedside.  Overnight events noted.   Patient reports feeling better.  He reports feeling dizzy in the last evening Dizziness has resolved.  Objective: Vitals:   07/23/21 2305 07/24/21 0425 07/24/21 0902 07/24/21 1413  BP: 112/63 (!) 147/67 (!) 147/94 140/64  Pulse: 66 60 63 64  Resp: 18 16 18 18   Temp: 98 F (36.7 C) 97.7 F (36.5 C) 98.2 F (36.8 C) 97.6 F (36.4 C)  TempSrc: Oral Oral Oral Oral  SpO2: 100% 100% 100% 100%  Weight:  81.3 kg    Height:        Intake/Output Summary (Last 24 hours) at 07/24/2021 1447 Last data filed at 07/24/2021 0709 Gross per 24 hour  Intake --  Output 2625 ml  Net -2625 ml   Filed Weights   07/22/21 0500 07/23/21 0419 07/24/21 0425  Weight: 83.9 kg 82.7 kg 81.3 kg    Examination:  General exam: Appears comfortable, chronically ill looking, NAD, deconditioned. Respiratory system: Clear to auscultation. Respiratory effort normal. Cardiovascular system: S1-S2 heard, regular rate and rhythm, no murmur.   Gastrointestinal system: Abdomen is soft, nontender, nondistended, BS +. Central nervous system: Alert and oriented x3 . No focal neurological deficits. Extremities: EDEMA ++ , no cyanosis, no clubbing. Skin: No rashes, lesions or ulcers Psychiatry: Judgement and insight appear normal. Mood & affect appropriate.     Data Reviewed: I have personally reviewed following labs and imaging studies  CBC: Recent Labs  Lab 07/21/21 1139 07/22/21 0002 07/23/21 0203 07/23/21 0829 07/24/21 0106  WBC 9.2 6.7  --  5.4 4.1  NEUTROABS 6.7  --   --   --   --   HGB 10.2* 8.6* 7.9* 8.7* 7.8*  HCT 28.6* 23.3* 22.5* 24.6* 22.8*  MCV 86.7 85.3  --  87.5 89.8  PLT 246 172  --   171 350   Basic Metabolic Panel: Recent Labs  Lab 07/21/21 1139 07/21/21 1905 07/21/21 2119 07/22/21 0002 07/22/21 0501 07/23/21 0203 07/23/21 0829 07/23/21 1216 07/23/21 1825 07/24/21 0106  NA 117*  --    < > 116*   < > 123*  123*  122* 126* 126* 128* 128*  K 3.8  --   --  4.0  --  4.1  4.0  --   --   --  4.4  CL 87*  --   --  88*  --  93*  93*  --   --   --  98  CO2 20*  --   --  21*  --  25  25  --   --   --  27  GLUCOSE 296* 408*  --  357*  --  251*  248*  --   --   --  281*  BUN 29*  --   --  27*  --  30*  29*  --   --   --  28*  CREATININE 2.15*  --   --  2.31*  --  2.39*  2.32*  --   --   --  2.22*  CALCIUM 9.2  --   --  8.5*  --  8.9  8.9  --   --   --  9.3  MG 1.9  --   --   --   --  2.0  --   --   --   --   PHOS  --   --   --  3.0  --  3.1  3.0  --   --   --  3.4   < > = values in this interval not displayed.   GFR: Estimated Creatinine Clearance: 35.7 mL/min (A) (by C-G formula based on SCr of 2.22 mg/dL (H)). Liver Function Tests: Recent Labs  Lab 07/21/21 1139 07/22/21 0002 07/23/21 0203 07/24/21 0106  AST 34  --   --   --   ALT 23  --   --   --   ALKPHOS 55  --   --   --   BILITOT 1.4*  --   --   --   PROT 6.2*  --   --   --   ALBUMIN 3.1* 2.6* 2.5* 2.5*   Recent Labs  Lab 07/21/21 1139  LIPASE 19   No results for input(s): AMMONIA in the last 168 hours. Coagulation Profile: No results for input(s): INR, PROTIME in the last 168 hours. Cardiac Enzymes: No results for input(s): CKTOTAL, CKMB, CKMBINDEX, TROPONINI in the last 168 hours. BNP (last 3 results) No results for input(s): PROBNP in the last 8760 hours. HbA1C: No results for input(s): HGBA1C in the last 72 hours. CBG: Recent Labs  Lab 07/23/21 1246 07/23/21 1707 07/23/21 2108 07/24/21 0055 07/24/21 0643  GLUCAP 296* 308* 175* 259* 255*   Lipid Profile: Recent Labs    07/22/21 0501  CHOL 134  HDL 44  LDLCALC 82  TRIG 40  CHOLHDL 3.0   Thyroid Function Tests: No  results for input(s): TSH, T4TOTAL, FREET4, T3FREE, THYROIDAB in the last 72 hours. Anemia Panel: Recent Labs    07/22/21 0002  VITAMINB12 683  FOLATE 35.0  FERRITIN 104  TIBC 197*  IRON 81   Sepsis Labs: No results for input(s): PROCALCITON, LATICACIDVEN in the last 168 hours.  Recent Results (from the past 240 hour(s))  Resp Panel by RT-PCR (Flu A&B, Covid) Nasopharyngeal Swab     Status: None   Collection Time: 07/21/21  3:49 PM   Specimen: Nasopharyngeal Swab; Nasopharyngeal(NP) swabs in vial transport medium  Result Value Ref Range Status   SARS Coronavirus 2 by RT PCR NEGATIVE NEGATIVE Final    Comment: (NOTE) SARS-CoV-2 target nucleic acids are NOT DETECTED.  The SARS-CoV-2 RNA is generally detectable in upper respiratory specimens during the acute phase of infection. The lowest concentration of SARS-CoV-2 viral copies this assay can detect is 138 copies/mL. A negative result does not preclude SARS-Cov-2 infection and should not be used as the sole basis for treatment or other patient management decisions. A negative result may occur with  improper specimen collection/handling, submission of specimen other than nasopharyngeal swab, presence of viral mutation(s) within the areas targeted by this assay, and inadequate number of viral copies(<138 copies/mL). A negative result must be combined with clinical observations, patient history, and epidemiological information. The expected result is Negative.  Fact Sheet for Patients:  EntrepreneurPulse.com.au  Fact Sheet for Healthcare Providers:  IncredibleEmployment.be  This test is no t yet approved or cleared by the [place] FDA and  has been authorized for detection and/or diagnosis of SARS-CoV-2 by FDA under an Emergency Use Authorization (EUA). This EUA will remain  in effect (meaning this test can be used) for the duration of the COVID-19 declaration under Section 564(b)(1) of  the Act, 21 U.S.C.section 360bbb-3(b)(1), unless the authorization is terminated  or revoked sooner.       Influenza A by PCR NEGATIVE NEGATIVE Final   Influenza B by PCR NEGATIVE NEGATIVE Final    Comment: (NOTE) The Xpert Xpress SARS-CoV-2/FLU/RSV plus assay is intended as an aid in the diagnosis of influenza from Nasopharyngeal swab specimens and should not be used as a sole basis for treatment. Nasal washings and aspirates are unacceptable for Xpert Xpress SARS-CoV-2/FLU/RSV testing.  Fact Sheet for Patients: EntrepreneurPulse.com.au  Fact Sheet for Healthcare Providers: IncredibleEmployment.be  This test is not yet approved or cleared by the [place] FDA and has been authorized for detection and/or diagnosis of SARS-CoV-2 by FDA under an Emergency Use Authorization (EUA). This EUA will remain in effect (meaning this test can be used) for the duration of the COVID-19 declaration under Section 564(b)(1) of the Act, 21 U.S.C. section 360bbb-3(b)(1), unless the authorization is terminated or revoked.  Performed at [facility], [place] [address], [place], [place] [address]   Urine Culture     Status: None   Collection Time: 07/21/21  8:02 PM   Specimen: Urine, Clean Catch  Result Value Ref Range Status   Specimen Description URINE, CLEAN CATCH  Final   Special Requests Normal  Final   Culture   Final    NO GROWTH Performed at [facility], [place] [address], [place], [place] [address]    Report Status 07/23/2021 FINAL  Final    Radiology Studies: ECHOCARDIOGRAM COMPLETE  Result Date: 07/22/2021    ECHOCARDIOGRAM REPORT   Patient Name:   KEREM GILMER Date of Exam: 07/22/2021 Medical Rec #:  [id number]      Height:       67.0 in Accession #:    [id number]     Weight:       184.9 lb Date of Birth:  Sep 26, 1960       BSA:          1.956 m Patient Age:    65 years       BP:           136/65 mmHg Patient Gender: M               HR:           66 bpm. Exam Location:  Inpatient Procedure: 2D Echo, Cardiac Doppler and Color Doppler Indications:    NSTEMI l21.4  History:        Patient has no prior history of Echocardiogram examinations.                 Risk Factors:Hypertension and Diabetes. Chronic kidney disease,                 stage 3a, Elevated troponin.  Sonographer:    BW Referring Phys: [place]  1. Left ventricular ejection fraction, by estimation, is 55 to 60%. The left ventricle has normal function. The left ventricle has no regional wall motion abnormalities. There is mild left ventricular hypertrophy. Left ventricular diastolic parameters are consistent with Grade II diastolic dysfunction (pseudonormalization).  2. Right ventricular systolic function is normal. The right ventricular size is normal.  3. Left atrial size was mildly dilated.  4. The mitral valve is normal in structure. Trivial mitral valve regurgitation. No evidence of mitral stenosis.  5. The aortic valve is tricuspid. Aortic valve regurgitation is not visualized. No aortic stenosis is present.  6. The inferior vena cava is normal in size with greater than 50% respiratory  variability, suggesting right atrial pressure of 3 mmHg. Comparison(s): No prior Echocardiogram. FINDINGS  Left Ventricle: Left ventricular ejection fraction, by estimation, is 55 to 60%. The left ventricle has normal function. The left ventricle has no regional wall motion abnormalities. The left ventricular internal cavity size was normal in size. There is  mild left ventricular hypertrophy. Left ventricular diastolic parameters are consistent with Grade II diastolic dysfunction (pseudonormalization). Right Ventricle: The right ventricular size is normal. Right ventricular systolic function is normal. Left Atrium: Left atrial size was mildly dilated. Right Atrium: Right atrial size was normal in size. Pericardium: Trivial pericardial effusion is present. Mitral  Valve: The mitral valve is normal in structure. Trivial mitral valve regurgitation. No evidence of mitral valve stenosis. Tricuspid Valve: The tricuspid valve is normal in structure. Tricuspid valve regurgitation is trivial. No evidence of tricuspid stenosis. Aortic Valve: The aortic valve is tricuspid. Aortic valve regurgitation is not visualized. No aortic stenosis is present. Pulmonic Valve: The pulmonic valve was normal in structure. Pulmonic valve regurgitation is not visualized. No evidence of pulmonic stenosis. Aorta: The aortic root is normal in size and structure. Venous: The inferior vena cava is normal in size with greater than 50% respiratory variability, suggesting right atrial pressure of 3 mmHg. IAS/Shunts: No atrial level shunt detected by color flow Doppler.  LEFT VENTRICLE PLAX 2D LVIDd:         4.40 cm   Diastology LVIDs:         2.80 cm   LV e' medial:    6.96 cm/s LV PW:         1.40 cm   LV E/e' medial:  16.2 LV IVS:        1.10 cm   LV e' lateral:   9.14 cm/s LVOT diam:     1.90 cm   LV E/e' lateral: 12.4 LV SV:         50 LV SV Index:   26 LVOT Area:     2.84 cm  RIGHT VENTRICLE RV S prime:     15.40 cm/s TAPSE (M-mode): 1.7 cm LEFT ATRIUM             Index        RIGHT ATRIUM           Index LA diam:        4.60 cm 2.35 cm/m   RA Area:     15.00 cm LA Vol (A2C):   50.5 ml 25.82 ml/m  RA Volume:   37.70 ml  19.28 ml/m LA Vol (A4C):   81.0 ml 41.41 ml/m LA Biplane Vol: 70.8 ml 36.20 ml/m  AORTIC VALVE LVOT Vmax:   80.30 cm/s LVOT Vmean:  59.300 cm/s LVOT VTI:    0.177 m  AORTA Ao Root diam: 3.20 cm MITRAL VALVE MV Area (PHT): 5.02 cm     SHUNTS MV Decel Time: 151 msec     Systemic VTI:  0.18 m MV E velocity: 113.00 cm/s  Systemic Diam: 1.90 cm MV A velocity: 57.00 cm/s MV E/A ratio:  1.98 Kirk Ruths MD Electronically signed by Kirk Ruths MD Signature Date/Time: 07/22/2021/3:57:23 PM    Final      Scheduled Meds:  aspirin EC  81 mg Oral Daily   atorvastatin  20 mg Oral QPC  lunch   carvedilol  25 mg Oral BID WC   cloNIDine  0.2 mg Oral QPM   diltiazem  120 mg Oral Daily   feeding supplement  237  mL Oral BID BM   ferrous sulfate  650 mg Oral QPC supper   fluticasone  1-2 spray Each Nare Daily   furosemide  20 mg Intravenous BID   gabapentin  300 mg Oral TID   heparin injection (subcutaneous)  5,000 Units Subcutaneous Q8H   insulin aspart  0-5 Units Subcutaneous QHS   insulin aspart  0-9 Units Subcutaneous TID WC   [START ON 07/25/2021] insulin glargine-yfgn  35 Units Subcutaneous Daily   isosorbide-hydrALAZINE  1 tablet Oral TID   levothyroxine  137 mcg Oral Q0600   magnesium oxide  400 mg Oral QPC lunch   multivitamin with minerals  1 tablet Oral QPC lunch   omega-3 acid ethyl esters  2,000 mg Oral QHS   pantoprazole  40 mg Oral Q0600   vitamin B-12  1,000 mcg Oral Daily   Continuous Infusions:     LOS: 3 days    Time spent: 25 mins    Shawna Clamp, MD [facility]   If 7PM-7AM, please contact night-coverage

## 2021-07-24 NOTE — Progress Notes (Signed)
Inpatient Diabetes Program Recommendations  AACE/ADA: New Consensus Statement on Inpatient Glycemic Control  Target Ranges:  Prepandial:   less than 140 mg/dL      Peak postprandial:   less than 180 mg/dL (1-2 hours)      Critically ill patients:  140 - 180 mg/dL   Results for Tim Spencer, Tim Spencer (MRN [id number]) as of 07/24/2021 09:42  Ref. Range 07/23/2021 06:36 07/23/2021 12:46 07/23/2021 17:07 07/23/2021 21:08 07/24/2021 00:55 07/24/2021 06:43  Glucose-Capillary Latest Ref Range: 70 - 99 mg/dL 222 (H) 296 (H) 308 (H) 175 (H) 259 (H) 255 (H)   Review of Glycemic Control  Diabetes history: DM2 Outpatient Diabetes medications: Toujeo 30 units daily, Humalog 10-15 units TID with meals Current orders for Inpatient glycemic control: Semglee 33 units daily, Novolog 0-9 units TID with meals, Novolog 0-5 units QHS   Inpatient Diabetes Program Recommendations:     Insulin: Please consider increasing Semglee to 36 units daily and ordering Novolog 5 units TID with meals for meal coverage if patient eats at least 50% of meals.  Thanks, Barnie Alderman, RN, MSN, CDE Diabetes Coordinator Inpatient Diabetes Program [phone] (Team Pager from 8am to 5pm)

## 2021-07-25 LAB — RENAL FUNCTION PANEL
Albumin: 2.6 g/dL — ABNORMAL LOW (ref 3.5–5.0)
Anion gap: 7 (ref 5–15)
BUN: 26 mg/dL — ABNORMAL HIGH (ref 8–23)
CO2: 25 mmol/L (ref 22–32)
Calcium: 9.7 mg/dL (ref 8.9–10.3)
Chloride: 100 mmol/L (ref 98–111)
Creatinine, Ser: 2.2 mg/dL — ABNORMAL HIGH (ref 0.61–1.24)
GFR, Estimated: 33 mL/min — ABNORMAL LOW (ref >60)
Glucose, Bld: 329 mg/dL — ABNORMAL HIGH (ref 70–99)
Phosphorus: 2.9 mg/dL (ref 2.5–4.6)
Potassium: 4.1 mmol/L (ref 3.5–5.1)
Sodium: 132 mmol/L — ABNORMAL LOW (ref 135–145)

## 2021-07-25 LAB — CBC
HCT: 23.4 % — ABNORMAL LOW (ref 39.0–52.0)
Hemoglobin: 7.9 g/dL — ABNORMAL LOW (ref 13.0–17.0)
MCH: 30.5 pg (ref 26.0–34.0)
MCHC: 33.8 g/dL (ref 30.0–36.0)
MCV: 90.3 fL (ref 80.0–100.0)
Platelets: 167 10*3/uL (ref 150–400)
RBC: 2.59 MIL/uL — ABNORMAL LOW (ref 4.22–5.81)
RDW: 12.8 % (ref 11.5–15.5)
WBC: 4.3 10*3/uL (ref 4.0–10.5)
nRBC: 0 % (ref 0.0–0.2)

## 2021-07-25 LAB — ANA W/REFLEX IF POSITIVE: Anti Nuclear Antibody (ANA): NEGATIVE

## 2021-07-25 LAB — C4 COMPLEMENT: Complement C4, Body Fluid: 18 mg/dL (ref 12–38)

## 2021-07-25 LAB — C3 COMPLEMENT: C3 Complement: 92 mg/dL (ref 82–167)

## 2021-07-25 LAB — GLUCOSE, CAPILLARY: Glucose-Capillary: 287 mg/dL — ABNORMAL HIGH (ref 70–99)

## 2021-07-25 MED ORDER — INSULIN ASPART 100 UNIT/ML IJ SOLN
7.0000 [IU] | Freq: Three times a day (TID) | INTRAMUSCULAR | Status: DC
  Administered 2021-07-25: 7 [IU] via SUBCUTANEOUS

## 2021-07-25 MED ORDER — TORSEMIDE 20 MG PO TABS: 20.0000 mg | ORAL_TABLET | Freq: Two times a day (BID) | ORAL | 1 refills | Status: DC

## 2021-07-25 MED ORDER — ISOSORB DINITRATE-HYDRALAZINE 20-37.5 MG PO TABS: 1.0000 | ORAL_TABLET | Freq: Three times a day (TID) | ORAL | 1 refills | Status: DC

## 2021-07-25 MED ORDER — TORSEMIDE 20 MG PO TABS: 20.0000 mg | ORAL_TABLET | Freq: Two times a day (BID) | ORAL | Status: DC

## 2021-07-25 MED ORDER — DARBEPOETIN ALFA 60 MCG/0.3ML IJ SOSY
60.0000 ug | PREFILLED_SYRINGE | Freq: Once | INTRAMUSCULAR | Status: DC
  Filled 2021-07-25: qty 0.3

## 2021-07-25 MED ORDER — ASPIRIN 81 MG PO TBEC: 81.0000 mg | DELAYED_RELEASE_TABLET | Freq: Every day | ORAL | 1 refills | Status: AC

## 2021-07-25 MED ORDER — NITROGLYCERIN 0.4 MG SL SUBL: 0.4000 mg | SUBLINGUAL_TABLET | SUBLINGUAL | 12 refills | Status: AC | PRN

## 2021-07-25 MED ORDER — INSULIN GLARGINE-YFGN 100 UNIT/ML ~~LOC~~ SOLN
40.0000 [IU] | Freq: Every day | SUBCUTANEOUS | Status: DC
  Administered 2021-07-25: 40 [IU] via SUBCUTANEOUS
  Filled 2021-07-25: qty 0.4

## 2021-07-25 NOTE — Progress Notes (Signed)
Nephrology Follow-Up Consult note   Assessment/Recommendations: Tim Spencer is a/an 60 y.o. male with a past medical history significant for DM2, CKD 3a/b, HTN, admitted for hyponatremia, AKI and volume overload.     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 Posey Pronto in clinic. Felt to have DKD with UPC in May of 2021 nearly 5g.  Poorly controlled diabetes in the past.  Now w/ volume overload w/ fairly low albumin at 2.5. Some of his Crt elevation may be progression of his CKD (BL 1.5 now Crt 2.2).  24-hour urine protein with 5 g which is stable from the past.  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  The patient has follow-up with Dr. Posey Pronto on 11/9. -SGLT2i and ARB can be started outpatient -Continue to monitor daily Cr, Dose meds for GFR -Monitor Daily I/Os, Daily weight  -Maintain MAP>65 for optimal renal perfusion.  -Avoid nephrotoxic medications including NSAIDs and Vanc/Zosyn combo  Hyponatremia: Hypervolemic.  Has resolved with fluid removal.  Sodium corrects to normal today  Elevated Tropinin: cardiology following. Felt to be demand  Volume overload/CHF exacerbation: Likely multifactorial with cardiorenal syndrome causing some diuretic refractory state.  Has significantly improved with diuresis.  Diuretics as above  Uncontrolled Diabetes Mellitus Type 2 with Hyperglycemia per primary  HTN: continue current meds. At goal.  Anemia: hgb low at 7.8. Iron replete with sat of 41.  Give 1 dose of Aranesp 60 MCG today   Recommendations conveyed to primary service.    [facility] 07/25/2021 12:06 PM  ___________________________________________________________  CC: Volume overload, AKI on CKD  Interval History/Subjective: Patient  continues to do slightly better today.  Good urine output with stable creatinine at 2.2.     Medications:  Current Facility-Administered Medications  Medication Dose Route Frequency Provider Last Rate Last Admin   acetaminophen (TYLENOL) tablet 650 mg  650 mg Oral Q4H PRN Eugenie Filler, MD   650 mg at 07/22/21 1733   ALPRAZolam Duanne Moron) tablet 0.25 mg  0.25 mg Oral BID PRN Eugenie Filler, MD   0.25 mg at 07/22/21 1740   alum & mag hydroxide-simeth (MAALOX/MYLANTA) 200-200-20 MG/5ML suspension 30 mL  30 mL Oral Q6H PRN Eugenie Filler, MD       And   lidocaine (XYLOCAINE) 2 % viscous mouth solution 15 mL  15 mL Oral Q6H PRN Eugenie Filler, MD   15 mL at 07/21/21 2319   aspirin EC tablet 81 mg  81 mg Oral Daily Eugenie Filler, MD   81 mg at 07/25/21 0818   atorvastatin (LIPITOR) tablet 20 mg  20 mg Oral QPC lunch Eugenie Filler, MD   20 mg at 07/24/21 1416   carvedilol (COREG) tablet 25 mg  25 mg Oral BID WC Eugenie Filler, MD   25 mg at 07/25/21 0818   cloNIDine (CATAPRES) tablet 0.2 mg  0.2 mg Oral QPM Eugenie Filler, MD   0.2 mg at 07/24/21 1655   Darbepoetin Alfa (ARANESP) injection 60 mcg  60 mcg Subcutaneous Once Reesa Chew, MD       diltiazem (CARDIZEM CD) 24 hr capsule 120 mg  120 mg Oral Daily Eugenie Filler, MD   120 mg at 07/25/21 0818   feeding supplement (ENSURE ENLIVE / ENSURE PLUS)  liquid 237 mL  237 mL Oral BID BM Shawna Clamp, MD   237 mL at 07/25/21 0819   ferrous sulfate tablet 650 mg  650 mg Oral QPC supper Eugenie Filler, MD   650 mg at 07/24/21 1655   fluticasone (FLONASE) 50 MCG/ACT nasal spray 1-2 spray  1-2 spray Each Nare Daily Etta Quill, DO   2 spray at 07/25/21 0819   gabapentin (NEURONTIN) capsule 300 mg  300 mg Oral TID Eugenie Filler, MD   300 mg at 07/25/21 0818   heparin injection 5,000 Units  5,000 Units Subcutaneous Q8H Ursula Beath, RPH   5,000 Units at 07/25/21 7026   insulin aspart (novoLOG)  injection 0-5 Units  0-5 Units Subcutaneous QHS Eugenie Filler, MD   4 Units at 07/24/21 2156   insulin aspart (novoLOG) injection 0-9 Units  0-9 Units Subcutaneous TID WC Eugenie Filler, MD   5 Units at 07/25/21 0705   insulin aspart (novoLOG) injection 7 Units  7 Units Subcutaneous TID WC Shawna Clamp, MD   7 Units at 07/25/21 0816   insulin glargine-yfgn (SEMGLEE) injection 40 Units  40 Units Subcutaneous Daily Shawna Clamp, MD   40 Units at 07/25/21 1000   isosorbide-hydrALAZINE (BIDIL) 20-37.5 MG per tablet 1 tablet  1 tablet Oral TID Tolia, Sunit, DO   1 tablet at 07/25/21 0818   lactulose ([place]) 10 GM/15ML solution 30 g  30 g Oral Daily PRN Shawna Clamp, MD   30 g at 07/25/21 0817   levothyroxine (SYNTHROID) tablet 137 mcg  137 mcg Oral Q0600 Eugenie Filler, MD   137 mcg at 07/25/21 3785   magnesium oxide (MAG-OX) tablet 400 mg  400 mg Oral QPC lunch Eugenie Filler, MD   400 mg at 07/24/21 1416   multivitamin with minerals tablet 1 tablet  1 tablet Oral QPC lunch Eugenie Filler, MD   1 tablet at 07/24/21 1417   nitroGLYCERIN (NITROSTAT) SL tablet 0.4 mg  0.4 mg Sublingual Q5 Min x 3 PRN Eugenie Filler, MD       omega-3 acid ethyl esters (LOVAZA) capsule 2,000 mg  2,000 mg Oral QHS Eugenie Filler, MD   2,000 mg at 07/24/21 2152   ondansetron [facility]) injection 4 mg  4 mg Intravenous Q6H PRN Eugenie Filler, MD   4 mg at 07/21/21 2134   pantoprazole (PROTONIX) EC tablet 40 mg  40 mg Oral Q0600 Eugenie Filler, MD   40 mg at 07/25/21 8850   torsemide (DEMADEX) tablet 20 mg  20 mg Oral BID Reesa Chew, MD       vitamin B-12 (CYANOCOBALAMIN) tablet 1,000 mcg  1,000 mcg Oral Daily Eugenie Filler, MD   1,000 mcg at 07/25/21 2774   Current Outpatient Medications  Medication Sig Dispense Refill   atorvastatin (LIPITOR) 20 MG tablet Take 20 mg by mouth daily after lunch.      carvedilol (COREG) 25 MG tablet Take 1 tablet (25 mg total) by mouth 2  (two) times daily with a meal. 30 tablet 1   cloNIDine (CATAPRES) 0.2 MG tablet Take 1 tablet (0.2 mg total) by mouth every evening. 60 tablet 1   diltiazem (CARDIZEM CD) 120 MG 24 hr capsule Take 1 capsule (120 mg total) by mouth daily. 30 capsule 1   EUTHYROX 137 MCG tablet Take 137 mcg by mouth daily.     ferrous sulfate 325 (65 FE) MG tablet Take 650 mg  by mouth daily after supper.      gabapentin (NEURONTIN) 300 MG capsule Take 300 mg by mouth 3 (three) times daily.     HUMALOG KWIKPEN 100 UNIT/ML KwikPen Inject 10-15 Units into the skin 3 (three) times daily before meals. Sliding Scale Insulin     Iron-FA-B Cmp-C-Biot-Probiotic (FUSION PLUS) CAPS Take 1 capsule by mouth daily.     magnesium oxide (MAG-OX) 400 MG tablet Take 400 mg by mouth daily after lunch.     Multiple Vitamin (MULTIVITAMIN WITH MINERALS) TABS tablet Take 1 tablet by mouth daily after lunch.     Omega-3 1000 MG CAPS Take 2,000 mg by mouth at bedtime.      TOUJEO SOLOSTAR 300 UNIT/ML Solostar Pen Inject 30 Units into the skin daily. 30     vitamin B-12 (CYANOCOBALAMIN) 1000 MCG tablet Take 1,000 mcg by mouth daily.     [START ON 07/26/2021] aspirin EC 81 MG EC tablet Take 1 tablet (81 mg total) by mouth daily. Swallow whole. 30 tablet 1   Continuous Blood Gluc Sensor (FREESTYLE LIBRE 2 SENSOR) MISC Inject into the skin every 14 (fourteen) days.     isosorbide-hydrALAZINE (BIDIL) 20-37.5 MG tablet Take 1 tablet by mouth 3 (three) times daily. 30 tablet 1   nitroGLYCERIN (NITROSTAT) 0.4 MG SL tablet Place 1 tablet (0.4 mg total) under the tongue every 5 (five) minutes x 3 doses as needed for chest pain. 30 tablet 12   torsemide (DEMADEX) 20 MG tablet Take 1 tablet (20 mg total) by mouth 2 (two) times daily. 60 tablet 1      Review of Systems: 10 systems reviewed and negative except per interval history/subjective  Physical Exam: Vitals:   07/25/21 0422 07/25/21 0721  BP: (!) 148/68 (!) 151/69  Pulse:  63  Resp:  18   Temp: 98.3 F (36.8 C) 97.8 F (36.6 C)  SpO2:  97%   No intake/output data recorded.  Intake/Output Summary (Last 24 hours) at 07/25/2021 1206 Last data filed at 07/25/2021 3557 Gross per 24 hour  Intake --  Output 500 ml  Net -500 ml   Constitutional: well-appearing, no acute distress ENMT: ears and nose without scars or lesions, MMM CV: normal rate, 1+ edema in the ankles bilaterally Respiratory: bilateral chest rise, normal work of breathing Gastrointestinal: soft, non-tender, no palpable masses or hernias Skin: no visible lesions or rashes Psych: alert, judgement/insight appropriate, appropriate mood and affect   Test Results I personally reviewed new and old clinical labs and radiology tests Lab Results  Component Value Date   NA 132 (L) 07/25/2021   K 4.1 07/25/2021   CL 100 07/25/2021   CO2 25 07/25/2021   BUN 26 (H) 07/25/2021   CREATININE 2.20 (H) 07/25/2021   CALCIUM 9.7 07/25/2021   ALBUMIN 2.6 (L) 07/25/2021   PHOS 2.9 07/25/2021

## 2021-07-25 NOTE — Discharge Summary (Signed)
Physician Discharge Summary  Tim Spencer LFY:[id number] DOB: 31-Dec-1960 DOA: 07/21/2021  PCP: Lin Landsman, MD  Admit date: 07/21/2021  Discharge date: 07/25/2021  Admitted From: Home.  Disposition:  Home  Recommendations for Outpatient Follow-up:  Follow up with PCP in 1-2 weeks. Please obtain BMP/CBC in one week. Advised to follow-up with Cardiology Dr. Virgina Jock in 3 to 4 weeks. Advised to follow-up with Nephrologist Dr. Posey Pronto as scheduled. Home medication has been adjusted.  Hydralazine was discontinued,  Patient is started on BiDil by cardiology. Patient's Lasix is transitioned with torsemide 20 mg twice daily.  Home Health:None Equipment/Devices:None  Discharge Condition: Stable CODE STATUS:Full code Diet recommendation: Heart Healthy  Brief [name]: This 60 years old male with PMH significant for recent hospitalization from 06/28/2021 - 07/01/2021 for hyponatremia secondary to diuretics, history of diabetes mellitus, CKD stage IIIa, hypertension, anemia, chronic anxiety presented to the ED with 2-day history of burning sensation in the chest and abdomen, generalized weakness and dizziness associated with some nausea. Patient is found to have hyponatremia sodium 117, Serum creatinine 2.15, high-sensitivity troponin noted to be elevated at 750> 822.  EKG shows prolonged PR interval. Patient was admitted for NSTEMI, hyponatremia, AKI on CKD, Cardiology and Nephrology is consulted. Patient was started on heparin, Patient denies any chest pain.  Echocardiogram unremarkable LVEF 60 to 65%.  No R WMA.  Patient's blood pressure medications were adjusted. Hydralazine was discontinued and Patient was started on BiDil by cardiology.  Patient may need outpatient cardiology follow-up.  Nephrologist recommended to continue diuretics.  Sodium has significantly improved with diuretics. Sodium at discharge 132.  Patient feels better and want to be discharged.  Patient has scheduled  appointment with Dr. Posey Pronto Nephrology in 2 to 3 days.  He was managed for below problems.  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 Patient presented with substernal chest pain with typical and atypical features. It appears like demand ischemia as compared to active ischemia. EKG showed prolonged PR interval. Troponins 750> 885> trending down Echocardiogram shows LVEF 55 to 60%, no regional wall motion abnormalities. Cardiology consulted,  Patient does have CAD risk factors,  will need ischemic work-up at some point. Continue aspirin, heparin and statin for now. Continue beta-blockers, heparin discontinued after 48 hours. Recommend ischemic work-up once serum creatinine improves/ outpatient.   Acute diastolic CHF: Continue with diuresis. Echo :Preserved LVEF, no regional wall motion abnormalities Continue beta-blocker and calcium renal blockers and Lasix. DC hydralazine and change to BiDil. Patient is not on ACE inhibitor due to AKI.  Atypical chest pain:  > Resolved. Patient complains of burning sensation in the upper chest Resolved with GI cocktail. Continue PPI daily  Severe hyponatremia: > Improved Patient recently admitted with hyponatremia which was felt to be secondary to diuretics. Patient reports he was told to take Lasix 20 mg every other day at home for pedal swelling He presented  volume overloaded at this time with lower extremity edema. Cardiology and nephrology recommended to continue Lasix 20 mg  twice daily for fluid overload.  Sodium 132today. Patient is having good urine output, continue to monitor serum sodium.  Hypothyroidism: Continue levothyroxine.  Diabetes mellitus type 2: Hemoglobin A1c 6.5 Continue long-acting Semglee 35 units Regular insulin sliding scale.  Normochromic normocytic anemia: Could be secondary to CKD. Transfuse if hemoglobin drops below 7. Hb remained stable.   Essential hypertension: Continue Clonidine, Coreg, diltiazem,. Cardiology recommended to discontinue hydralazine and start BiDil.   Bilateral lower extremity edema: Continue Lasix 20 mg twice daily Could be hypoalbuminemia.   Hx. of Orthostatic Hypotension: Stable.  Monitor with BP meds.   AKI on CKD stage IIIa: Creatinine on admission 2.15, baseline creatinine 1.46 Could be due to prerenal azotemia secondary to volume overload in setting of diuretics. He may have progression of the CKD stage III,  Monitoring daily weight. Patient can be discharged home and follow-up with nephrology.  Chronic constipation: Continue as needed Senokot.  Discharge Instructions  Discharge Instructions     Call MD for:  difficulty breathing, headache or visual disturbances   Complete by: As directed    Call MD for:  persistant dizziness or light-headedness   Complete by: As directed    Call MD for:  persistant nausea and vomiting   Complete by: As directed    Diet - low sodium heart healthy   Complete by: As directed    Diet - low sodium heart healthy   Complete by: As directed    Diet Carb Modified   Complete by: As directed    Discharge instructions   Complete by: As directed    Advised to follow-up with primary care physician in 1 week. Advised to follow-up with cardiology Dr. Dimas Millin in 3 to 4 weeks. Advised to follow-up with nephrologist Dr. Posey Pronto as scheduled. Home medication has been adjusted.  Hydralazine was discontinued patient is started on BiDil by cardiology. Patient's  Lasix is transitioned with torsemide 20 mg twice daily.   Increase activity slowly   Complete by: As directed    Increase activity slowly   Complete by: As directed       Allergies as of 07/25/2021   No Known Allergies      Medication List     STOP taking these medications    furosemide 20 MG tablet Commonly known as: LASIX   hydrALAZINE 100 MG tablet Commonly known as: APRESOLINE       TAKE these medications    aspirin 81 MG EC tablet Take 1 tablet (81 mg total) by mouth daily. Swallow whole. Start taking on: July 26, 2021   atorvastatin 20 MG tablet Commonly known as: LIPITOR Take 20 mg by mouth daily after lunch.   carvedilol 25 MG tablet Commonly known as: COREG Take 1 tablet (25 mg total) by mouth 2 (two) times daily with a meal.   cloNIDine 0.2 MG tablet Commonly known as: CATAPRES Take 1 tablet (0.2 mg total) by mouth every evening.   diltiazem 120 MG 24 hr capsule Commonly known as: CARDIZEM CD Take 1 capsule (120 mg total) by mouth daily.   Euthyrox 137 MCG tablet Generic drug: levothyroxine Take 137 mcg by mouth daily.   ferrous sulfate 325 (65 FE) MG tablet Take 650 mg by mouth daily after supper.   FreeStyle Libre 2 Sensor Misc Inject into the skin every 14 (fourteen) days.   Fusion Plus Caps Take 1 capsule by mouth daily.   gabapentin 300 MG capsule Commonly known as: NEURONTIN Take 300 mg by mouth 3 (three) times daily.   HumaLOG  KwikPen 100 UNIT/ML KwikPen Generic drug: insulin lispro Inject 10-15 Units into the skin 3 (three) times daily before meals. Sliding Scale Insulin   isosorbide-hydrALAZINE 20-37.5 MG tablet Commonly known as: BIDIL Take 1 tablet by mouth 3 (three) times daily.   magnesium oxide 400 MG tablet Commonly known as: MAG-OX Take 400 mg by mouth daily after lunch.   multivitamin with minerals Tabs tablet Take 1 tablet by mouth daily after lunch.   nitroGLYCERIN 0.4 MG SL tablet Commonly known as:  NITROSTAT Place 1 tablet (0.4 mg total) under the tongue every 5 (five) minutes x 3 doses as needed for chest pain.   Omega-3 1000 MG Caps Take 2,000 mg by mouth at bedtime.   torsemide 20 MG tablet Commonly known as: DEMADEX Take 1 tablet (20 mg total) by mouth 2 (two) times daily.   Toujeo SoloStar 300 UNIT/ML Solostar Pen Generic drug: insulin glargine (1 Unit Dial) Inject 30 Units into the skin daily. 30   vitamin B-12 1000 MCG tablet Commonly known as: CYANOCOBALAMIN Take 1,000 mcg by mouth daily.        Follow-up Information     Patwardhan, Reynold Bowen, MD. Call in 1 day(s).   Specialties: Cardiology, Radiology Contact information: [place] [address] [phone]         Lin Landsman, MD Follow up in 1 week(s).   Specialty: Family Medicine Contact information: [place] [address] [phone]         Elmarie Shiley, MD Follow up in 1 week(s).   Specialty: Nephrology Contact information: [place] [address] [phone]                No Known Allergies  Consultations: Cardiology Nephrology   Procedures/Studies: DG Chest 2 View  Result Date: 07/21/2021 CLINICAL DATA:  Shortness of breath, dizziness, and chills for 3 days. EXAM: CHEST - 2 VIEW COMPARISON:  None. FINDINGS: The heart size and mediastinal contours are within normal limits. Mild pulmonary vascular congestion. Trace bilateral pleural effusions. No consolidation or pneumothorax. No acute osseous abnormality. IMPRESSION: 1. Mild pulmonary vascular congestion with trace bilateral pleural effusions. Electronically Signed   By: Titus Dubin M.D.   On: 07/21/2021 09:52   ECHOCARDIOGRAM COMPLETE  Result Date: 07/22/2021    ECHOCARDIOGRAM REPORT   Patient Name:   Tim Spencer Date of Exam: 07/22/2021 Medical Rec #:  [id number]      Height:       67.0 in Accession #:    [id number]     Weight:       184.9 lb Date of Birth:   September 21, 1961       BSA:          1.956 m Patient Age:    60 years       BP:           136/65 mmHg Patient Gender: M              HR:           66 bpm. Exam Location:  Inpatient Procedure: 2D Echo, Cardiac Doppler and Color Doppler Indications:    NSTEMI l21.4  History:        Patient has no prior history of Echocardiogram examinations.                 Risk Factors:Hypertension and Diabetes. Chronic kidney disease,  stage 3a, Elevated troponin.  Sonographer:    BW Referring Phys: [place]  1. Left ventricular ejection fraction, by estimation, is 55 to 60%. The left ventricle has normal function. The left ventricle has no regional wall motion abnormalities. There is mild left ventricular hypertrophy. Left ventricular diastolic parameters are consistent with Grade II diastolic dysfunction (pseudonormalization).  2. Right ventricular systolic function is normal. The right ventricular size is normal.  3. Left atrial size was mildly dilated.  4. The mitral valve is normal in structure. Trivial mitral valve regurgitation. No evidence of mitral stenosis.  5. The aortic valve is tricuspid. Aortic valve regurgitation is not visualized. No aortic stenosis is present.  6. The inferior vena cava is normal in size with greater than 50% respiratory variability, suggesting right atrial pressure of 3 mmHg. Comparison(s): No prior Echocardiogram. FINDINGS  Left Ventricle: Left ventricular ejection fraction, by estimation, is 55 to 60%. The left ventricle has normal function. The left ventricle has no regional wall motion abnormalities. The left ventricular internal cavity size was normal in size. There is  mild left ventricular hypertrophy. Left ventricular diastolic parameters are consistent with Grade II diastolic dysfunction (pseudonormalization). Right Ventricle: The right ventricular size is normal. Right ventricular systolic function is normal. Left Atrium: Left atrial size was mildly  dilated. Right Atrium: Right atrial size was normal in size. Pericardium: Trivial pericardial effusion is present. Mitral Valve: The mitral valve is normal in structure. Trivial mitral valve regurgitation. No evidence of mitral valve stenosis. Tricuspid Valve: The tricuspid valve is normal in structure. Tricuspid valve regurgitation is trivial. No evidence of tricuspid stenosis. Aortic Valve: The aortic valve is tricuspid. Aortic valve regurgitation is not visualized. No aortic stenosis is present. Pulmonic Valve: The pulmonic valve was normal in structure. Pulmonic valve regurgitation is not visualized. No evidence of pulmonic stenosis. Aorta: The aortic root is normal in size and structure. Venous: The inferior vena cava is normal in size with greater than 50% respiratory variability, suggesting right atrial pressure of 3 mmHg. IAS/Shunts: No atrial level shunt detected by color flow Doppler.  LEFT VENTRICLE PLAX 2D LVIDd:         4.40 cm   Diastology LVIDs:         2.80 cm   LV e' medial:    6.96 cm/s LV PW:         1.40 cm   LV E/e' medial:  16.2 LV IVS:        1.10 cm   LV e' lateral:   9.14 cm/s LVOT diam:     1.90 cm   LV E/e' lateral: 12.4 LV SV:         50 LV SV Index:   26 LVOT Area:     2.84 cm  RIGHT VENTRICLE RV S prime:     15.40 cm/s TAPSE (M-mode): 1.7 cm LEFT ATRIUM             Index        RIGHT ATRIUM           Index LA diam:        4.60 cm 2.35 cm/m   RA Area:     15.00 cm LA Vol (A2C):   50.5 ml 25.82 ml/m  RA Volume:   37.70 ml  19.28 ml/m LA Vol (A4C):   81.0 ml 41.41 ml/m LA Biplane Vol: 70.8 ml 36.20 ml/m  AORTIC VALVE LVOT Vmax:   80.30 cm/s LVOT Vmean:  59.300 cm/s LVOT VTI:    0.177 m  AORTA Ao Root diam: 3.20 cm MITRAL VALVE MV Area (PHT): 5.02 cm     SHUNTS MV Decel Time: 151 msec     Systemic VTI:  0.18 m MV E velocity: 113.00 cm/s  Systemic Diam: 1.90 cm MV A velocity: 57.00 cm/s MV E/A ratio:  1.98 Kirk Ruths MD Electronically signed by Kirk Ruths MD Signature  Date/Time: 07/22/2021/3:57:23 PM    Final       Subjective: Patient was seen and examined at bedside.  Overnight events noted.   Patient reports feeling much improved.  Patient wants to be discharged.  Discharge Exam: Vitals:   07/25/21 0422 07/25/21 0721  BP: (!) 148/68 (!) 151/69  Pulse:  63  Resp:  18  Temp: 98.3 F (36.8 C) 97.8 F (36.6 C)  SpO2:  97%   Vitals:   07/24/21 2037 07/25/21 0004 07/25/21 0422 07/25/21 0721  BP: (!) 145/65 (!) 153/70 (!) 148/68 (!) 151/69  Pulse: 78 70  63  Resp: 18 16  18   Temp: 98 F (36.7 C) 98.4 F (36.9 C) 98.3 F (36.8 C) 97.8 F (36.6 C)  TempSrc: Oral Oral Oral Oral  SpO2: 100% 100%  97%  Weight:   80.4 kg   Height:        General: Pt is alert, awake, not in acute distress Cardiovascular: RRR, S1/S2 +, no rubs, no gallops Respiratory: CTA bilaterally, no wheezing, no rhonchi Abdominal: Soft, NT, ND, bowel sounds + Extremities: Edema++, no cyanosis, no clubbing.    The results of significant diagnostics from this hospitalization (including imaging, microbiology, ancillary and laboratory) are listed below for reference.     Microbiology: Recent Results (from the past 240 hour(s))  Resp Panel by RT-PCR (Flu A&B, Covid) Nasopharyngeal Swab     Status: None   Collection Time: 07/21/21  3:49 PM   Specimen: Nasopharyngeal Swab; Nasopharyngeal(NP) swabs in vial transport medium  Result Value Ref Range Status   SARS Coronavirus 2 by RT PCR NEGATIVE NEGATIVE Final    Comment: (NOTE) SARS-CoV-2 target nucleic acids are NOT DETECTED.  The SARS-CoV-2 RNA is generally detectable in upper respiratory specimens during the acute phase of infection. The lowest concentration of SARS-CoV-2 viral copies this assay can detect is 138 copies/mL. A negative result does not preclude SARS-Cov-2 infection and should not be used as the sole basis for treatment or other patient management decisions. A negative result may occur with  improper  specimen collection/handling, submission of specimen other than nasopharyngeal swab, presence of viral mutation(s) within the areas targeted by this assay, and inadequate number of viral copies(<138 copies/mL). A negative result must be combined with clinical observations, patient history, and epidemiological information. The expected result is Negative.  Fact Sheet for Patients:  EntrepreneurPulse.com.au  Fact Sheet for Healthcare Providers:  IncredibleEmployment.be  This test is no t yet approved or cleared by the [place] FDA and  has been authorized for detection and/or diagnosis of SARS-CoV-2 by FDA under an Emergency Use Authorization (EUA). This EUA will remain  in effect (meaning this test can be used) for the duration of the COVID-19 declaration under Section 564(b)(1) of the Act, 21 U.S.C.section 360bbb-3(b)(1), unless the authorization is terminated  or revoked sooner.       Influenza A by PCR NEGATIVE NEGATIVE Final   Influenza B by PCR NEGATIVE NEGATIVE Final    Comment: (NOTE) The Xpert Xpress SARS-CoV-2/FLU/RSV plus assay is intended as an aid  in the diagnosis of influenza from Nasopharyngeal swab specimens and should not be used as a sole basis for treatment. Nasal washings and aspirates are unacceptable for Xpert Xpress SARS-CoV-2/FLU/RSV testing.  Fact Sheet for Patients: EntrepreneurPulse.com.au  Fact Sheet for Healthcare Providers: IncredibleEmployment.be  This test is not yet approved or cleared by the [place] FDA and has been authorized for detection and/or diagnosis of SARS-CoV-2 by FDA under an Emergency Use Authorization (EUA). This EUA will remain in effect (meaning this test can be used) for the duration of the COVID-19 declaration under Section 564(b)(1) of the Act, 21 U.S.C. section 360bbb-3(b)(1), unless the authorization is terminated or revoked.  Performed at  [facility], [place] [address] [place], [place] [address]   Urine Culture     Status: None   Collection Time: 07/21/21  8:02 PM   Specimen: Urine, Clean Catch  Result Value Ref Range Status   Specimen Description URINE, CLEAN CATCH  Final   Special Requests Normal  Final   Culture   Final    NO GROWTH Performed at [facility], [place] [address], [place], [place] [address]    Report Status 07/23/2021 FINAL  Final     Labs: BNP (last 3 results) Recent Labs    06/28/21 1727 06/29/21 1521 07/21/21 1905  BNP 429.3* 530.5* 573.2*   Basic Metabolic Panel: Recent Labs  Lab 07/21/21 1139 07/21/21 1905 07/21/21 2119 07/22/21 0002 07/22/21 0501 07/23/21 0203 07/23/21 0829 07/23/21 1216 07/23/21 1825 07/24/21 0106 07/25/21 0152  NA 117*  --    < > 116*   < > 123*  123*  122* 126* 126* 128* 128* 132*  K 3.8  --   --  4.0  --  4.1  4.0  --   --   --  4.4 4.1  CL 87*  --   --  88*  --  93*  93*  --   --   --  98 100  CO2 20*  --   --  21*  --  25  25  --   --   --  27 25  GLUCOSE 296* 408*  --  357*  --  251*  248*  --   --   --  281* 329*  BUN 29*  --   --  27*  --  30*  29*  --   --   --  28* 26*  CREATININE 2.15*  --   --  2.31*  --  2.39*  2.32*  --   --   --  2.22* 2.20*  CALCIUM 9.2  --   --  8.5*  --  8.9  8.9  --   --   --  9.3 9.7  MG 1.9  --   --   --   --  2.0  --   --   --   --   --   PHOS  --   --   --  3.0  --  3.1  3.0  --   --   --  3.4 2.9   < > = values in this interval not displayed.   Liver Function Tests: Recent Labs  Lab 07/21/21 1139 07/22/21 0002 07/23/21 0203 07/24/21 0106 07/25/21 0152  AST 34  --   --   --   --   ALT 23  --   --   --   --   ALKPHOS 55  --   --   --   --  BILITOT 1.4*  --   --   --   --   PROT 6.2*  --   --   --   --   ALBUMIN 3.1* 2.6* 2.5* 2.5* 2.6*   Recent Labs  Lab 07/21/21 1139  LIPASE 19   No results for input(s): AMMONIA in the last 168 hours. CBC: Recent Labs  Lab  07/21/21 1139 07/22/21 0002 07/23/21 0203 07/23/21 0829 07/24/21 0106 07/25/21 0152  WBC 9.2 6.7  --  5.4 4.1 4.3  NEUTROABS 6.7  --   --   --   --   --   HGB 10.2* 8.6* 7.9* 8.7* 7.8* 7.9*  HCT 28.6* 23.3* 22.5* 24.6* 22.8* 23.4*  MCV 86.7 85.3  --  87.5 89.8 90.3  PLT 246 172  --  171 167 167   Cardiac Enzymes: No results for input(s): CKTOTAL, CKMB, CKMBINDEX, TROPONINI in the last 168 hours. BNP: Invalid input(s): POCBNP CBG: Recent Labs  Lab 07/24/21 0055 07/24/21 0643 07/24/21 1636 07/24/21 2124 07/25/21 0636  GLUCAP 259* 255* 319* 337* 287*   D-Dimer No results for input(s): DDIMER in the last 72 hours. Hgb A1c No results for input(s): HGBA1C in the last 72 hours. Lipid Profile No results for input(s): CHOL, HDL, LDLCALC, TRIG, CHOLHDL, LDLDIRECT in the last 72 hours. Thyroid function studies No results for input(s): TSH, T4TOTAL, T3FREE, THYROIDAB in the last 72 hours.  Invalid input(s): FREET3 Anemia work up No results for input(s): VITAMINB12, FOLATE, FERRITIN, TIBC, IRON, RETICCTPCT in the last 72 hours. Urinalysis    Component Value Date/Time   COLORURINE YELLOW 06/28/2021 1721   APPEARANCEUR CLEAR 06/28/2021 1721   LABSPEC 1.009 06/28/2021 1721   PHURINE 7.0 06/28/2021 1721   GLUCOSEU 150 (A) 06/28/2021 1721   HGBUR NEGATIVE 06/28/2021 1721   BILIRUBINUR NEGATIVE 06/28/2021 1721   KETONESUR 5 (A) 06/28/2021 1721   PROTEINUR >=300 (A) 06/28/2021 1721   NITRITE NEGATIVE 06/28/2021 1721   LEUKOCYTESUR NEGATIVE 06/28/2021 1721   Sepsis Labs Invalid input(s): PROCALCITONIN,  WBC,  LACTICIDVEN Microbiology Recent Results (from the past 240 hour(s))  Resp Panel by RT-PCR (Flu A&B, Covid) Nasopharyngeal Swab     Status: None   Collection Time: 07/21/21  3:49 PM   Specimen: Nasopharyngeal Swab; Nasopharyngeal(NP) swabs in vial transport medium  Result Value Ref Range Status   SARS Coronavirus 2 by RT PCR NEGATIVE NEGATIVE Final    Comment:  (NOTE) SARS-CoV-2 target nucleic acids are NOT DETECTED.  The SARS-CoV-2 RNA is generally detectable in upper respiratory specimens during the acute phase of infection. The lowest concentration of SARS-CoV-2 viral copies this assay can detect is 138 copies/mL. A negative result does not preclude SARS-Cov-2 infection and should not be used as the sole basis for treatment or other patient management decisions. A negative result may occur with  improper specimen collection/handling, submission of specimen other than nasopharyngeal swab, presence of viral mutation(s) within the areas targeted by this assay, and inadequate number of viral copies(<138 copies/mL). A negative result must be combined with clinical observations, patient history, and epidemiological information. The expected result is Negative.  Fact Sheet for Patients:  EntrepreneurPulse.com.au  Fact Sheet for Healthcare Providers:  IncredibleEmployment.be  This test is no t yet approved or cleared by the [place] FDA and  has been authorized for detection and/or diagnosis of SARS-CoV-2 by FDA under an Emergency Use Authorization (EUA). This EUA will remain  in effect (meaning this test can be used) for the duration of the  COVID-19 declaration under Section 564(b)(1) of the Act, 21 U.S.C.section 360bbb-3(b)(1), unless the authorization is terminated  or revoked sooner.       Influenza A by PCR NEGATIVE NEGATIVE Final   Influenza B by PCR NEGATIVE NEGATIVE Final    Comment: (NOTE) The Xpert Xpress SARS-CoV-2/FLU/RSV plus assay is intended as an aid in the diagnosis of influenza from Nasopharyngeal swab specimens and should not be used as a sole basis for treatment. Nasal washings and aspirates are unacceptable for Xpert Xpress SARS-CoV-2/FLU/RSV testing.  Fact Sheet for Patients: EntrepreneurPulse.com.au  Fact Sheet for Healthcare  Providers: IncredibleEmployment.be  This test is not yet approved or cleared by the [place] FDA and has been authorized for detection and/or diagnosis of SARS-CoV-2 by FDA under an Emergency Use Authorization (EUA). This EUA will remain in effect (meaning this test can be used) for the duration of the COVID-19 declaration under Section 564(b)(1) of the Act, 21 U.S.C. section 360bbb-3(b)(1), unless the authorization is terminated or revoked.  Performed at [facility], [place] [address], [place], [place] [address]   Urine Culture     Status: None   Collection Time: 07/21/21  8:02 PM   Specimen: Urine, Clean Catch  Result Value Ref Range Status   Specimen Description URINE, CLEAN CATCH  Final   Special Requests Normal  Final   Culture   Final    NO GROWTH Performed at [facility], [place] [address] [place], [place] [address]    Report Status 07/23/2021 FINAL  Final     Time coordinating discharge: Over 30 minutes  SIGNED:   Shawna Clamp, MD  [facility] 07/25/2021, 2:57 PM Pager   If 7PM-7AM, please contact night-coverage

## 2021-07-25 NOTE — Progress Notes (Signed)
Inpatient Diabetes Program Recommendations  AACE/ADA: New Consensus Statement on Inpatient Glycemic Control   Target Ranges:  Prepandial:   less than 140 mg/dL      Peak postprandial:   less than 180 mg/dL (1-2 hours)      Critically ill patients:  140 - 180 mg/dL  Results for Tim Spencer, Tim Spencer (MRN [id number]) as of 07/25/2021 07:27  Ref. Range 07/24/2021 06:43 07/24/2021 16:36 07/24/2021 21:24 07/25/2021 06:36  Glucose-Capillary Latest Ref Range: 70 - 99 mg/dL 255 (H) 319 (H) 337 (H) 287 (H)   Review of Glycemic Control  Diabetes history: DM2 Outpatient Diabetes medications: Toujeo 30 units daily, Humalog 10-15 units TID with meals Current orders for Inpatient glycemic control: Semglee 35 units daily, Novolog 0-9 units TID with meals, Novolog 0-5 units QHS   Inpatient Diabetes Program Recommendations:     Insulin: Please consider increasing Semglee to 40 units daily and ordering Novolog 7 units TID with meals for meal coverage if patient eats at least 50% of meals.   Thanks, Barnie Alderman, RN, MSN, CDE Diabetes Coordinator Inpatient Diabetes Program [phone] (Team Pager from 8am to 5pm)

## 2021-07-25 NOTE — Discharge Instructions (Signed)
Advised to follow-up with primary care physician in 1 week. Advised to follow-up with cardiology Dr. Virgina Jock in 3 to 4 weeks. Advised to follow-up with nephrologist Dr. Posey Pronto as scheduled. Home medication has been adjusted.  Hydralazine was discontinued,  Patient is started on BiDil by cardiology. Patient's Lasix is transitioned with torsemide 20 mg twice daily.

## 2021-07-25 NOTE — Plan of Care (Signed)
  Problem: Health Behavior/Discharge Planning: Goal: Ability to manage health-related needs will improve Outcome: Progressing   Problem: Clinical Measurements: Goal: Respiratory complications will improve Outcome: Progressing   

## 2021-07-26 LAB — IMMUNOFIXATION, URINE

## 2021-07-27 LAB — IMMUNOFIXATION ELECTROPHORESIS
IgA: 110 mg/dL (ref 61–437)
IgG (Immunoglobin G), Serum: 797 mg/dL (ref 603–1613)
IgM (Immunoglobulin M), Srm: 48 mg/dL (ref 20–172)
Total Protein ELP: 5 g/dL — ABNORMAL LOW (ref 6.0–8.5)

## 2021-07-28 ENCOUNTER — Telehealth: Payer: Self-pay

## 2021-07-28 NOTE — Telephone Encounter (Signed)
Location of hospitalization: [place] Reason for hospitalization: weakness, vertigo Date of discharge: 07/25/2021 Date of first communication with patient: today Person contacting patient: Tim Spencer,  Current symptoms: none Do you understand why you were in the Hospital: Yes Questions regarding discharge instructions: None Where were you discharged to: Home Medications reviewed: Yes Allergies reviewed: Yes Dietary changes reviewed: Yes. Discussed low fat and low salt diet.  Referals reviewed: NA Activities of Daily Living: Able to with mild limitations Any transportation issues/concerns: None Any patient concerns: None Confirmed importance & date/time of Follow up appt: Yes Confirmed with patient if condition begins to worsen call. Pt was given the office number and encouraged to call back with questions or concerns: Yes

## 2021-08-14 ENCOUNTER — Other Ambulatory Visit: Payer: Self-pay

## 2021-08-14 ENCOUNTER — Encounter: Payer: Self-pay | Admitting: Cardiology

## 2021-08-14 ENCOUNTER — Ambulatory Visit: Payer: BC Managed Care – PPO | Admitting: Cardiology

## 2021-08-14 VITALS — BP 136/53 | HR 80 | Temp 98.1°F | Ht 67.0 in | Wt 179.0 lb

## 2021-08-14 DIAGNOSIS — I509 Heart failure, unspecified: Secondary | ICD-10-CM

## 2021-08-14 DIAGNOSIS — I10 Essential (primary) hypertension: Secondary | ICD-10-CM | POA: Insufficient documentation

## 2021-08-14 DIAGNOSIS — I951 Orthostatic hypotension: Secondary | ICD-10-CM

## 2021-08-14 DIAGNOSIS — I209 Angina pectoris, unspecified: Secondary | ICD-10-CM

## 2021-08-14 MED ORDER — DILTIAZEM HCL ER COATED BEADS 180 MG PO CP24: 180.0000 mg | ORAL_CAPSULE | Freq: Every day | ORAL | 3 refills | Status: DC

## 2021-08-14 NOTE — Progress Notes (Signed)
Patient referred by Lin Landsman, MD for leg edema, orthostatic hypotension  Subjective:   Tim Spencer, male    DOB: 1961-02-02, 60 y.o.   MRN: [id number]   Chief Complaint  Patient presents with   Follow-up   Hospitalization Follow-up   Heart failure     HPI  60 y.o. Tim Spencer male with hypertension, CKD 3, idiopathic progressive neuropathy, orthostatic hypotension/ supine hypertension, hyponatremia.   Patient was hospitalized in 06/2021 for progressive generalized weakness/malaise, loss of appetite and nausea with 2 episodes of nonbloody nonbilious emesis. He was treated for hypertensive urgency as well as well orthostatic hypotension, hyponatremia. [name] cortisol was normal. He sees nephrologist Dr. Elmarie Spencer. His lasix was held after his recent hospitalization, after which he gained 10 lbs and developed leg swelling. Lasix was thus resumed, but he had Na reduced further. Therefore, it was changed to every other day. Patient subsequently presented with nausea, vomiting and generalized burning sensation. While in the ED, he had pleuritic chest pain. Workup in the ED showed severe hyponatremia at 117, nonspecific EKG, HS trop mildly elevated at 750-->822 (checked 14 min apart). His BNP has recently been elevated up to 530.  Echocardiogram showed normal LVEF, grade 2 DD. Trop remained flat. Coronary angiography was not recommended due to elevated Cr and no significant chest pain. He was further diuresed with improvement in his sodium to 132.  Patient is here for follow up today. He denies chest pain, shortness of breath. Legs remain swollen. He remains confused about whether he should increase sodium intake to increase his Sr Na level.    Current Outpatient Medications on File Prior to Visit  Medication Sig Dispense Refill   aspirin EC 81 MG EC tablet Take 1 tablet (81 mg total) by mouth daily. Swallow whole. 30 tablet 1   atorvastatin (LIPITOR) 20 MG tablet Take 20 mg by mouth  daily after lunch.      carvedilol (COREG) 25 MG tablet Take 1 tablet (25 mg total) by mouth 2 (two) times daily with a meal. 30 tablet 1   cloNIDine (CATAPRES) 0.2 MG tablet Take 1 tablet (0.2 mg total) by mouth every evening. 60 tablet 1   Continuous Blood Gluc Sensor (FREESTYLE LIBRE 2 SENSOR) MISC Inject into the skin every 14 (fourteen) days.     diltiazem (CARDIZEM CD) 120 MG 24 hr capsule Take 1 capsule (120 mg total) by mouth daily. 30 capsule 1   EUTHYROX 137 MCG tablet Take 137 mcg by mouth daily.     ferrous sulfate 325 (65 FE) MG tablet Take 650 mg by mouth daily after supper.      gabapentin (NEURONTIN) 300 MG capsule Take 300 mg by mouth 3 (three) times daily.     HUMALOG KWIKPEN 100 UNIT/ML KwikPen Inject 10-15 Units into the skin 3 (three) times daily before meals. Sliding Scale Insulin     Iron-FA-B Cmp-C-Biot-Probiotic (FUSION PLUS) CAPS Take 1 capsule by mouth daily.     isosorbide-hydrALAZINE (BIDIL) 20-37.5 MG tablet Take 1 tablet by mouth 3 (three) times daily. 30 tablet 1   magnesium oxide (MAG-OX) 400 MG tablet Take 400 mg by mouth daily after lunch.     Multiple Vitamin (MULTIVITAMIN WITH MINERALS) TABS tablet Take 1 tablet by mouth daily after lunch.     nitroGLYCERIN (NITROSTAT) 0.4 MG SL tablet Place 1 tablet (0.4 mg total) under the tongue every 5 (five) minutes x 3 doses as needed for chest pain. 30 tablet 12  Omega-3 1000 MG CAPS Take 2,000 mg by mouth at bedtime.      torsemide (DEMADEX) 20 MG tablet Take 1 tablet (20 mg total) by mouth 2 (two) times daily. 60 tablet 1   TOUJEO SOLOSTAR 300 UNIT/ML Solostar Pen Inject 30 Units into the skin daily. 30     vitamin B-12 (CYANOCOBALAMIN) 1000 MCG tablet Take 1,000 mcg by mouth daily.     No current facility-administered medications on file prior to visit.    Cardiovascular and other pertinent studies:  Echocardiogram 07/22/2021:  1. Left ventricular ejection fraction, by estimation, is 55 to 60%. The  left  ventricle has normal function. The left ventricle has no regional  wall motion abnormalities. There is mild left ventricular hypertrophy.  Left ventricular diastolic parameters are consistent with Grade II diastolic dysfunction (pseudonormalization).   2. Right ventricular systolic function is normal. The right ventricular  size is normal.   3. Left atrial size was mildly dilated.   4. The mitral valve is normal in structure. Trivial mitral valve  regurgitation. No evidence of mitral stenosis.   5. The aortic valve is tricuspid. Aortic valve regurgitation is not  visualized. No aortic stenosis is present.   6. The inferior vena cava is normal in size with greater than 50%  respiratory variability, suggesting right atrial pressure of 3 mmHg.   Carotid artery duplex  06/20/2020:  Minimal stenosis in the right internal carotid artery (1-15%).  Minimal stenosis in the left internal carotid artery (1-15%).  Mild heterogeneous plaque noted in R>L carotid arteries.  Antegrade right vertebral artery flow. Antegrade left vertebral artery  Flow.  EKG 06/06/2020: Sinus rhythm 70 bpm Occaisonal PVC   Recent labs: 07/25/2021: Glucose 329, BUN/Cr 26/2.2. EGFR 33. Na/K 132/4.1. Albumin 2.6 H/H 7.9/23.4. MCV 90. Platelets 167 HbA1C 6.9% Chol 134, TG 40, HDL 44, LDL 82 TSH 4.4 normal  Immunofixation: The immunofixation pattern appears unremarkable. Evidence of  monoclonal protein is not apparent.  Kappa FLC 61 (3.3-10.4) Lambda FLC 29 (5.7-26.3) Kappa/Lambda ratio 2.1 (0.26-1.65)  ANA negative, complements normal  07/18/2020: Glucose 130 BUN/creatinine 27/1.6.  eGFR 47/56.  NA/K 130/4.5. H/H 10.6/29.8 MCV 84.  Platelets 262. Serum osmolality 267 (275-295)  06/30/2020: Glucose 169, BUN/Cr 26/2.0. EGFR 36. Na/K 125/5.5. Chloride 92. Ca 10.1. Rest of the CMP normal H/H 10.6/29.8. MCV 84. Platelets 262 HbA1C 7.3% Chol 99, TG 83, HDL 41, LDL 45 TSH 11.2 high   05/10/2020: Glucose 230,  BUN/Cr 33/1.84. EGFR 39. Na/K 127/5.3. Cl 92.    03/29/2020: BNP 183  03/29/2020: Glucose 131, BUN/Cr 24/1.6. EGFR 47. Na/K 135/4.9.  ALB: 3.3 Chol 117, TG 40, HDL 53, LDL 56 TSH 6.48 normal BNP 183    Review of Systems  Constitutional: Positive for malaise/fatigue.  Cardiovascular:  Positive for leg swelling. Negative for chest pain, dyspnea on exertion, palpitations and syncope.  Musculoskeletal:        Right toe pain  Neurological:  Positive for dizziness and light-headedness.        Vitals:   08/14/21 1230  BP: (!) 136/53  Pulse: 80  Temp: 98.1 F (36.7 C)  SpO2: 100%    Orthostatic VS for the past 72 hrs (Last 3 readings):  Orthostatic BP Patient Position BP Location Cuff Size Orthostatic Pulse  08/14/21 1301 162/70 Standing Right Arm Normal 66  08/14/21 1254 172/77 Sitting Right Arm Normal 69  08/14/21 1251 164/76 Supine Right Arm Normal 70     Body mass index is 28.04 kg/m. Filed  Weights   08/14/21 1230  Weight: 179 lb (81.2 kg)     Objective:   Physical Exam Vitals and nursing note reviewed.  Constitutional:      General: He is not in acute distress. Neck:     Vascular: No JVD.  Cardiovascular:     Rate and Rhythm: Normal rate and regular rhythm.     Pulses:          Carotid pulses are  on the right side with bruit.      Dorsalis pedis pulses are 2+ on the right side and 2+ on the left side.       Posterior tibial pulses are 2+ on the right side and 2+ on the left side.     Heart sounds: Normal heart sounds. No murmur heard. Pulmonary:     Effort: Pulmonary effort is normal.     Breath sounds: Normal breath sounds. No wheezing or rales.  Musculoskeletal:     Right lower leg: Edema (2+) present.     Left lower leg: Edema (2+) present.          Assessment & Recommendations:   60 y.o. Tim Spencer male with hypertension, CKD 3, idiopathic progressive neuropathy, orthostatic hypotension/ supine hypertension, hyponatremia.  Orthostatic  hypotension/supine hypertension: Likely related to autonomic dysfunction in diabetic patient.  Leg edema likely combination of HFpEF and medication side effects.  Changed the following: Hydralazine 1/2 tab of 100 mg tid to 1/2 tab of 100 mg bid. 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 He is going to have weekly labs through his PCP for the next few weeks.   Elevated troponin: Occurred in the setting of HFpEF. He did have retrosternal burning sensation then. Will check Lexiscan nuclear stress test.  F/u in 2 weeks  Time spent: 45 min   Nigel Mormon, MD Pager: [phone] Office: [phone]

## 2021-08-15 ENCOUNTER — Telehealth: Payer: Self-pay | Admitting: Cardiology

## 2021-08-15 NOTE — Telephone Encounter (Signed)
Called pts wife and went over medication list. Patient voiced understanding.

## 2021-08-15 NOTE — Telephone Encounter (Signed)
Patient's wife says at yesterday's appointment, Dr. Virgina Jock said he would have patient stop taking a medication, but they are unsure which medication. His wife asked about having a medication list as well. Please call her at [phone].

## 2021-08-23 ENCOUNTER — Ambulatory Visit: Payer: BC Managed Care – PPO

## 2021-08-23 ENCOUNTER — Other Ambulatory Visit: Payer: Self-pay

## 2021-08-23 DIAGNOSIS — I209 Angina pectoris, unspecified: Secondary | ICD-10-CM

## 2021-08-27 DIAGNOSIS — R9439 Abnormal result of other cardiovascular function study: Secondary | ICD-10-CM | POA: Insufficient documentation

## 2021-08-27 NOTE — Progress Notes (Signed)
Patient referred by Tim Landsman, MD for leg edema, orthostatic hypotension  Subjective:   Tim Spencer, male    DOB: 02-09-1961, 60 y.o.   MRN: [id number]  Chief Complaint  Patient presents with   Congestive Heart Failure   Follow-up    2 weeks     HPI  60 y.o. Tim Spencer male with hypertension, CKD 3, idiopathic progressive neuropathy, orthostatic hypotension/ supine hypertension, hyponatremia.   Patient was hospitalized in 06/2021 for progressive generalized weakness/malaise, loss of appetite and nausea with 2 episodes of nonbloody nonbilious emesis. He was treated for hypertensive urgency as well as well orthostatic hypotension, hyponatremia. [name] cortisol was normal. He sees nephrologist Dr. Elmarie Shiley. His lasix was held after his recent hospitalization, after which he gained 10 lbs and developed leg swelling. Lasix was thus resumed, but he had Na reduced further. Therefore, it was changed to every other day. Patient subsequently presented with nausea, vomiting and generalized burning sensation. While in the ED, he had pleuritic chest pain. Workup in the ED showed severe hyponatremia at 117, nonspecific EKG, HS trop mildly elevated at 750-->822 (checked 14 min apart). His BNP has recently been elevated up to 530.  Echocardiogram showed normal LVEF, grade 2 DD. Trop remained flat. Coronary angiography was not recommended due to elevated Cr and no significant chest pain. He was further diuresed with improvement in his sodium to 132.  Patient is here for follow up today. Recent stress test showed mild ischemia, details below.  He is performing treadmill and stationary bicycle exercises for up to 40 minutes at low speed without any.  Symptoms of chest pain.  Leg swelling persists, but has improved.  He is taking Lasix as per nephrology recommendations.  He is getting his Sr sodium checked through PCP, results not available to me. Reportedly, it was was up to 137 last week, per the  patient.   Blood pressure is elevated today. He has been out of carvedilol for 3 days.    Current Outpatient Medications on File Prior to Visit  Medication Sig Dispense Refill   aspirin EC 81 MG EC tablet Take 1 tablet (81 mg total) by mouth daily. Swallow whole. 30 tablet 1   atorvastatin (LIPITOR) 20 MG tablet Take 20 mg by mouth daily after lunch.      carvedilol (COREG) 25 MG tablet Take 1 tablet (25 mg total) by mouth 2 (two) times daily with a meal. 30 tablet 1   cloNIDine (CATAPRES) 0.2 MG tablet Take 1 tablet (0.2 mg total) by mouth every evening. (Patient taking differently: Take 0.1 mg by mouth every evening.) 60 tablet 1   Continuous Blood Gluc Sensor (FREESTYLE LIBRE 2 SENSOR) MISC Inject into the skin every 14 (fourteen) days.     diltiazem (CARDIZEM CD) 180 MG 24 hr capsule Take 1 capsule (180 mg total) by mouth daily. 30 capsule 3   EUTHYROX 137 MCG tablet Take 137 mcg by mouth daily.     ferrous sulfate 325 (65 FE) MG tablet Take 650 mg by mouth daily after supper.      gabapentin (NEURONTIN) 300 MG capsule Take 300 mg by mouth 3 (three) times daily.     HUMALOG KWIKPEN 100 UNIT/ML KwikPen Inject 10-15 Units into the skin 3 (three) times daily before meals. Sliding Scale Insulin     hydrALAZINE (APRESOLINE) 100 MG tablet Take 50 mg by mouth in the morning and at bedtime.     Iron-FA-B Cmp-C-Biot-Probiotic (FUSION PLUS) CAPS Take  1 capsule by mouth daily.     isosorbide-hydrALAZINE (BIDIL) 20-37.5 MG tablet Take 1 tablet by mouth 3 (three) times daily. (Patient not taking: Reported on 08/14/2021) 30 tablet 1   magnesium oxide (MAG-OX) 400 MG tablet Take 400 mg by mouth daily after lunch.     Multiple Vitamin (MULTIVITAMIN WITH MINERALS) TABS tablet Take 1 tablet by mouth daily after lunch.     nitroGLYCERIN (NITROSTAT) 0.4 MG SL tablet Place 1 tablet (0.4 mg total) under the tongue every 5 (five) minutes x 3 doses as needed for chest pain. 30 tablet 12   Omega-3 1000 MG CAPS Take  2,000 mg by mouth at bedtime.      torsemide (DEMADEX) 20 MG tablet Take 1 tablet (20 mg total) by mouth 2 (two) times daily. 60 tablet 1   TOUJEO SOLOSTAR 300 UNIT/ML Solostar Pen Inject 30 Units into the skin daily. 30     vitamin B-12 (CYANOCOBALAMIN) 1000 MCG tablet Take 1,000 mcg by mouth daily.     No current facility-administered medications on file prior to visit.    Cardiovascular and other pertinent studies:  Lexiscan Tetrofosmin stress test 08/23/2021: Lexiscan nuclear stress test performed using 1-day protocol. Stress EKG is non-diagnostic, as this is pharmacological stress test. in addition, rest and stress EKG showed sinus rhythm, inferolateral T wave inversion.  SPECT images show medium sized, mild intensity, reversible perfusion defect in apical to basal, inferior/inferolateral myocardium.  Stress LVEF 50% Low risk study.  Echocardiogram 07/22/2021:  1. Left ventricular ejection fraction, by estimation, is 55 to 60%. The  left ventricle has normal function. The left ventricle has no regional  wall motion abnormalities. There is mild left ventricular hypertrophy.  Left ventricular diastolic parameters are consistent with Grade II diastolic dysfunction (pseudonormalization).   2. Right ventricular systolic function is normal. The right ventricular  size is normal.   3. Left atrial size was mildly dilated.   4. The mitral valve is normal in structure. Trivial mitral valve  regurgitation. No evidence of mitral stenosis.   5. The aortic valve is tricuspid. Aortic valve regurgitation is not  visualized. No aortic stenosis is present.   6. The inferior vena cava is normal in size with greater than 50%  respiratory variability, suggesting right atrial pressure of 3 mmHg.   Carotid artery duplex  06/20/2020:  Minimal stenosis in the right internal carotid artery (1-15%).  Minimal stenosis in the left internal carotid artery (1-15%).  Mild heterogeneous plaque noted in R>L  carotid arteries.  Antegrade right vertebral artery flow. Antegrade left vertebral artery  Flow.  EKG 06/06/2020: Sinus rhythm 70 bpm Occaisonal PVC   Recent labs: 07/25/2021: Glucose 329, BUN/Cr 26/2.2. EGFR 33. Na/K 132/4.1. Albumin 2.6 H/H 7.9/23.4. MCV 90. Platelets 167 HbA1C 6.9% Chol 134, TG 40, HDL 44, LDL 82 TSH 4.4 normal  Immunofixation: The immunofixation pattern appears unremarkable. Evidence of  monoclonal protein is not apparent.  Kappa FLC 61 (3.3-10.4) Lambda FLC 29 (5.7-26.3) Kappa/Lambda ratio 2.1 (0.26-1.65)  ANA negative, complements normal  07/18/2020: Glucose 130 BUN/creatinine 27/1.6.  eGFR 47/56.  NA/K 130/4.5. H/H 10.6/29.8 MCV 84.  Platelets 262. Serum osmolality 267 (275-295)  06/30/2020: Glucose 169, BUN/Cr 26/2.0. EGFR 36. Na/K 125/5.5. Chloride 92. Ca 10.1. Rest of the CMP normal H/H 10.6/29.8. MCV 84. Platelets 262 HbA1C 7.3% Chol 99, TG 83, HDL 41, LDL 45 TSH 11.2 high   05/10/2020: Glucose 230, BUN/Cr 33/1.84. EGFR 39. Na/K 127/5.3. Cl 92.    03/29/2020: BNP 183  03/29/2020: Glucose 131, BUN/Cr 24/1.6. EGFR 47. Na/K 135/4.9.  ALB: 3.3 Chol 117, TG 40, HDL 53, LDL 56 TSH 6.48 normal BNP 183    Review of Systems  Constitutional: Positive for malaise/fatigue.  Cardiovascular:  Positive for leg swelling. Negative for chest pain, dyspnea on exertion, palpitations and syncope.  Musculoskeletal:        Right toe pain  Neurological:  Positive for dizziness and light-headedness.        Vitals:   08/28/21 1509 08/28/21 1516  BP: (!) 205/98 (!) 191/94  Pulse: 86 86  Resp: 17   Temp: 98.3 F (36.8 C)   SpO2: 98%      Body mass index is 27.72 kg/m. Filed Weights   08/28/21 1509  Weight: 177 lb (80.3 kg)     Objective:   Physical Exam Vitals and nursing note reviewed.  Constitutional:      General: He is not in acute distress. Neck:     Vascular: No JVD.  Cardiovascular:     Rate and Rhythm: Normal rate and regular  rhythm.     Pulses:          Carotid pulses are  on the right side with bruit.      Dorsalis pedis pulses are 2+ on the right side and 2+ on the left side.       Posterior tibial pulses are 2+ on the right side and 2+ on the left side.     Heart sounds: Normal heart sounds. No murmur heard. Pulmonary:     Effort: Pulmonary effort is normal.     Breath sounds: Normal breath sounds. No wheezing or rales.  Musculoskeletal:     Right lower leg: Edema (2+) present.     Left lower leg: Edema (2+) present.          Assessment & Recommendations:   60 y.o. Tim Spencer male with hypertension, CKD 3, idiopathic progressive neuropathy, orthostatic hypotension/ supine hypertension, hyponatremia.  Orthostatic hypotension/supine hypertension: Likely related to autonomic dysfunction in diabetic patient.  Leg edema likely combination of HFpEF and medication side effects.  Changed the following: Changed diltiazem 180 mg daily to 90 mg bid, as per patient preference. 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 He is going to have weekly labs through his PCP for the next few weeks.   Abnormal stress test: Mild inferior ischemia. No angina symptoms at this time. Continue medical management.   F/u in 4 weeks    Nigel Mormon, MD Pager: [phone] Office: [phone]

## 2021-08-28 ENCOUNTER — Other Ambulatory Visit: Payer: Self-pay

## 2021-08-28 ENCOUNTER — Encounter: Payer: Self-pay | Admitting: Cardiology

## 2021-08-28 ENCOUNTER — Ambulatory Visit: Payer: BC Managed Care – PPO | Admitting: Cardiology

## 2021-08-28 VITALS — BP 191/94 | HR 86 | Temp 98.3°F | Resp 17 | Ht 67.0 in | Wt 177.0 lb

## 2021-08-28 DIAGNOSIS — I5032 Chronic diastolic (congestive) heart failure: Secondary | ICD-10-CM

## 2021-08-28 DIAGNOSIS — I951 Orthostatic hypotension: Secondary | ICD-10-CM

## 2021-08-28 DIAGNOSIS — I10 Essential (primary) hypertension: Secondary | ICD-10-CM

## 2021-08-28 DIAGNOSIS — R9439 Abnormal result of other cardiovascular function study: Secondary | ICD-10-CM

## 2021-08-28 MED ORDER — DILTIAZEM HCL ER 90 MG PO CP12: 90.0000 mg | ORAL_CAPSULE | Freq: Two times a day (BID) | ORAL | 3 refills | Status: DC

## 2021-08-28 MED ORDER — CARVEDILOL 25 MG PO TABS: 25.0000 mg | ORAL_TABLET | Freq: Two times a day (BID) | ORAL | 3 refills | Status: DC

## 2021-10-02 ENCOUNTER — Ambulatory Visit: Payer: BC Managed Care – PPO | Admitting: Cardiology

## 2022-01-30 ENCOUNTER — Other Ambulatory Visit: Payer: Self-pay | Admitting: Cardiology

## 2022-01-30 DIAGNOSIS — I10 Essential (primary) hypertension: Secondary | ICD-10-CM

## 2022-01-31 ENCOUNTER — Encounter: Payer: Self-pay | Admitting: Neurology

## 2022-01-31 ENCOUNTER — Ambulatory Visit (INDEPENDENT_AMBULATORY_CARE_PROVIDER_SITE_OTHER): Payer: BC Managed Care – PPO | Admitting: Neurology

## 2022-01-31 VITALS — BP 190/84 | HR 69 | Ht 67.0 in | Wt 178.0 lb

## 2022-01-31 DIAGNOSIS — R269 Unspecified abnormalities of gait and mobility: Secondary | ICD-10-CM

## 2022-01-31 DIAGNOSIS — M5442 Lumbago with sciatica, left side: Secondary | ICD-10-CM

## 2022-01-31 DIAGNOSIS — G629 Polyneuropathy, unspecified: Secondary | ICD-10-CM | POA: Insufficient documentation

## 2022-01-31 DIAGNOSIS — G6289 Other specified polyneuropathies: Secondary | ICD-10-CM | POA: Diagnosis not present

## 2022-01-31 DIAGNOSIS — G8929 Other chronic pain: Secondary | ICD-10-CM

## 2022-01-31 MED ORDER — DULOXETINE HCL 60 MG PO CPEP: 60.0000 mg | ORAL_CAPSULE | Freq: Every day | ORAL | 11 refills | Status: DC

## 2022-01-31 NOTE — Progress Notes (Signed)
? ?Chief Complaint  ?Patient presents with  ? New Patient (Initial Visit)  ?  Room 12, with wife  ?NP paper referral for peripheral neuropathy/Dr. Leshara ?Located in both hands, legs and feet worse in right toe  ? ? ? ? ?ASSESSMENT AND PLAN ? ?Tim Spencer is a 61 y.o. male   ?Long history of diabetes, with complications, including diabetic peripheral neuropathy, retinopathy, ?Slow worsening low back pain, bilateral lower extremity paresthesia, gait abnormality, ?Worsening bilateral hands paresthesia, ?Neuropathic pain, ? Examination showed distal left weakness, left worse than right, ? Likely peripheral neuropathy with superimposed lumbosacral radiculopathy, ? MRI of lumbar spine, cervical spine for degenerative changes, ? Laboratory evaluation for treatable causes of peripheral neuropathy ? Referred to outpatient physical therapy ? Add on Cymbalta 60 mg daily, gabapentin 300 mg 3 tablets maximum due to his chronic renal disease, ?Orthostatic hypotension ? I have suggested him to leave his blood pressure to bedtime, avoid early morning blood pressure medication, try to take his first dose of blood pressure medicine after meal, after he is well-hydrated, ? ?DIAGNOSTIC DATA (LABS, IMAGING, TESTING) ?- I reviewed patient records, labs, notes, testing and imaging myself where available. ? ?Laboratory evaluations in February 2023, elevated TSH 6.720, normal free T4, triglyceride was elevated 299, LDL was 94 glucose was 227, creatinine was 3, A1c was 7.0, hemoglobin was 12.2, ?MEDICAL HISTORY: ? ?Tim Spencer, is a 61 year old male, seen in request by his primary care physician Dr. Ayesha Rumpf, [place], companied by his wife for evaluation of worsening bilateral lower extremity paresthesia, unsteady gait, fall, ? ?I reviewed and summarized the referring note. PMHX. ?HLD ?HTN ?DM 21 years ?CKD ? ?Patient has diabetes for more than 20 years, around 2010, he began to notice bilateral feet numbness  tingling, gradually getting worse over the past many years, now extending to knee level, also developed variable degree are sharp pain involving his lower extremity, needle prick sensation, burning, difficulty bearing weight, ? ?He began to use cane few years back, now reliant on walker for longer distance since 2020 ? ?He also reported a history of lumbar minimally invasive procedure for low back pain left lumbar radicular pain in the past, now presenting with worsening lower extremity pain, radiating pain to left hip, ? ?He also diagnosed with orthostatic hypotension, is on 3 agents for blood pressure control, this including Coreg 25 mg twice a day, clonidine 0.2 mg half tablets every day, isosorbide/hydrochlorothiazide 1 tablet 3 times a day, ? ?He complains of declining functional status over the past few years, rely on his cane and walker for longer distance, transient sharp pain of bilateral lower extremity, despite uptitrating dose of gabapentin up to 300 mg 3 times a day, not a good candidate for higher dose due to chronic kidney disease, ? ?His neuropathy symptoms continue to progress, now began to involving bilateral upper extremities since 2023, he denies bowel and bladder incontinence, is able to transfer himself in and out of wheelchair ? ? ?PHYSICAL EXAM: ?  ?Vitals:  ? 01/31/22 1347  ?BP: (!) 190/84  ?Pulse: 69  ?Weight: 178 lb (80.7 kg)  ?Height: '5\' 7"'$  (1.702 m)  ? ?Not recorded ?  ? ? ?Body mass index is 27.88 kg/m?. ? ?PHYSICAL EXAMNIATION: ? ?Gen: NAD, conversant, well nourised, well groomed                     ?Cardiovascular: Regular rate rhythm, no peripheral edema, warm, nontender. ?Eyes: Conjunctivae clear without exudates  or hemorrhage ?Neck: Supple, no carotid bruits. ?Pulmonary: Clear to auscultation bilaterally  ? ?NEUROLOGICAL EXAM: ? ?MENTAL STATUS: ?Speech cognition: ?Awake, alert, oriented to history taking care of conversation ?  ?CRANIAL NERVES: ?CN II: Visual fields are full to  confrontation. Pupils are round equal and briskly reactive to light. ?CN III, IV, VI: extraocular movement are normal. No ptosis. ?CN V: Facial sensation is intact to light touch ?CN VII: Face is symmetric with normal eye closure  ?CN VIII: Hearing is normal to causal conversation. ?CN IX, X: Phonation is normal. ?CN XI: Head turning and shoulder shrug are intact ? ?MOTOR: Bilateral upper extremity proximal muscle strength is normal, mild bilateral finger abduction weakness, mild bilateral ankle dorsiflexion weakness left worse than right, moderate bilateral toe flexion, extension weakness, ? ?REFLEXES: Absent ? ?SENSORY: Length-dependent decreased light touch, vibratory sensation, pinprick to mid shin level, left worse than right, ? ?COORDINATION: ?There is no trunk or limb dysmetria noted. ? ?GAIT/STANCE: He needs push-up to get up from seated position, wide-based, unsteady, could not stand up on tiptoes or heels, positive Romberg signs, ? ?REVIEW OF SYSTEMS:  ?Full 14 system review of systems performed and notable only for as above ?All other review of systems were negative. ? ? ?ALLERGIES: ?No Known Allergies ? ?HOME MEDICATIONS: ?Current Outpatient Medications  ?Medication Sig Dispense Refill  ? ALPRAZolam (XANAX) 0.5 MG tablet Take 0.5 mg by mouth 3 (three) times daily.    ? aspirin EC 81 MG EC tablet Take 1 tablet (81 mg total) by mouth daily. Swallow whole. 30 tablet 1  ? atorvastatin (LIPITOR) 20 MG tablet Take 20 mg by mouth daily after lunch.     ? carvedilol (COREG) 25 MG tablet Take 1 tablet (25 mg total) by mouth 2 (two) times daily with a meal. 180 tablet 3  ? cloNIDine (CATAPRES) 0.2 MG tablet Take 1 tablet (0.2 mg total) by mouth every evening. (Patient taking differently: Take 0.1 mg by mouth every evening.) 60 tablet 1  ? Continuous Blood Gluc Sensor (FREESTYLE LIBRE 2 SENSOR) MISC Inject into the skin every 14 (fourteen) days.    ? diltiazem (CARDIZEM SR) 90 MG 12 hr capsule Take 1 capsule (90 mg  total) by mouth 2 (two) times daily. 180 capsule 3  ? EUTHYROX 137 MCG tablet Take 137 mcg by mouth daily.    ? ferrous sulfate 325 (65 FE) MG tablet Take 650 mg by mouth daily after supper.     ? gabapentin (NEURONTIN) 300 MG capsule Take 300 mg by mouth 3 (three) times daily.    ? HUMALOG KWIKPEN 100 UNIT/ML KwikPen Inject 10-15 Units into the skin 3 (three) times daily before meals. Sliding Scale Insulin    ? Iron-FA-B Cmp-C-Biot-Probiotic (FUSION PLUS) CAPS Take 1 capsule by mouth daily.    ? isosorbide-hydrALAZINE (BIDIL) 20-37.5 MG tablet Take 1 tablet by mouth 3 (three) times daily. 30 tablet 1  ? magnesium oxide (MAG-OX) 400 MG tablet Take 400 mg by mouth daily after lunch.    ? Multiple Vitamin (MULTIVITAMIN WITH MINERALS) TABS tablet Take 1 tablet by mouth daily after lunch.    ? nitroGLYCERIN (NITROSTAT) 0.4 MG SL tablet Place 1 tablet (0.4 mg total) under the tongue every 5 (five) minutes x 3 doses as needed for chest pain. 30 tablet 12  ? Omega-3 1000 MG CAPS Take 2,000 mg by mouth at bedtime.     ? torsemide (DEMADEX) 20 MG tablet Take 1 tablet (20 mg total) by mouth  2 (two) times daily. 60 tablet 1  ? TOUJEO SOLOSTAR 300 UNIT/ML Solostar Pen Inject 30 Units into the skin daily. 30    ? vitamin B-12 (CYANOCOBALAMIN) 1000 MCG tablet Take 1,000 mcg by mouth daily.    ? ?No current facility-administered medications for this visit.  ? ? ?PAST MEDICAL HISTORY: ?Past Medical History:  ?Diagnosis Date  ? Anemia   ? CKD (chronic kidney disease) stage 3, GFR 30-59 ml/min (HCC)   ? Hypertension   ? Idiopathic progressive neuropathy   ? Secondary hyperparathyroidism, renal ([place])   ? Type 2 diabetes mellitus ([place])   ? ? ?PAST SURGICAL HISTORY: ?Past Surgical History:  ?Procedure Laterality Date  ? GIVENS CAPSULE STUDY N/A 09/01/2020  ? Procedure: GIVENS CAPSULE STUDY;  Surgeon: Juanita Craver, MD;  Location: [facility] ENDOSCOPY;  Service: Endoscopy;  Laterality: N/A;  ? ? ?FAMILY HISTORY: ?Family History  ?Problem Relation  Age of Onset  ? Stroke Mother   ? Hypertension Mother   ? Diabetes Father   ? ? ?SOCIAL HISTORY: ?Social History  ? ?Socioeconomic History  ? Marital status: Married  ?  Spouse name: Not on file  ? Number of

## 2022-02-01 ENCOUNTER — Encounter: Payer: Self-pay | Admitting: Neurology

## 2022-02-01 ENCOUNTER — Telehealth: Payer: Self-pay | Admitting: Neurology

## 2022-02-01 NOTE — Telephone Encounter (Signed)
Please let patient know, I have reviewed his symptoms, will keep current plan  ?

## 2022-02-01 NOTE — Telephone Encounter (Signed)
I spoke with the patient. I advised him to keep the current treatment plan. He was agreeable. All questions answered. ?

## 2022-02-01 NOTE — Telephone Encounter (Signed)
Pt states he failed to mention on yesterday during his visit with Dr Krista Blue his right side pain when he wakes in the morning.  Pt also has the pain on right side on hip & back of thigh.  Pt is asking for a call so this can be discussed and added to his MRI  ?

## 2022-02-01 NOTE — Telephone Encounter (Signed)
Pt would like a call from the nurse 

## 2022-02-05 ENCOUNTER — Encounter: Payer: Self-pay | Admitting: Cardiology

## 2022-02-05 ENCOUNTER — Ambulatory Visit: Payer: BC Managed Care – PPO | Admitting: Cardiology

## 2022-02-05 VITALS — BP 177/82 | HR 65 | Temp 98.2°F | Resp 16 | Ht 67.0 in | Wt 177.0 lb

## 2022-02-05 DIAGNOSIS — I5032 Chronic diastolic (congestive) heart failure: Secondary | ICD-10-CM

## 2022-02-05 DIAGNOSIS — I10 Essential (primary) hypertension: Secondary | ICD-10-CM

## 2022-02-05 DIAGNOSIS — I951 Orthostatic hypotension: Secondary | ICD-10-CM

## 2022-02-05 LAB — MULTIPLE MYELOMA PANEL, SERUM
Albumin SerPl Elph-Mcnc: 3.3 g/dL (ref 2.9–4.4)
Albumin/Glob SerPl: 1.1 (ref 0.7–1.7)
Alpha 1: 0.2 g/dL (ref 0.0–0.4)
Alpha2 Glob SerPl Elph-Mcnc: 0.9 g/dL (ref 0.4–1.0)
B-Globulin SerPl Elph-Mcnc: 0.9 g/dL (ref 0.7–1.3)
Gamma Glob SerPl Elph-Mcnc: 1.1 g/dL (ref 0.4–1.8)
Globulin, Total: 3.1 g/dL (ref 2.2–3.9)
IgA/Immunoglobulin A, Serum: 138 mg/dL (ref 61–437)
IgG (Immunoglobin G), Serum: 1364 mg/dL (ref 603–1613)
IgM (Immunoglobulin M), Srm: 69 mg/dL (ref 20–172)
Total Protein: 6.4 g/dL (ref 6.0–8.5)

## 2022-02-05 LAB — SEDIMENTATION RATE: Sed Rate: 40 mm/hr — ABNORMAL HIGH (ref 0–30)

## 2022-02-05 LAB — VITAMIN D 25 HYDROXY (VIT D DEFICIENCY, FRACTURES): Vit D, 25-Hydroxy: 49.1 ng/mL (ref 30.0–100.0)

## 2022-02-05 LAB — VITAMIN B12: Vitamin B-12: >2000 pg/mL — ABNORMAL HIGH (ref 232–1245)

## 2022-02-05 LAB — ANA W/REFLEX IF POSITIVE: Anti Nuclear Antibody (ANA): NEGATIVE

## 2022-02-05 LAB — HGB A1C W/O EAG: Hgb A1c MFr Bld: 6.7 % — ABNORMAL HIGH (ref 4.8–5.6)

## 2022-02-05 LAB — HIV ANTIBODY (ROUTINE TESTING W REFLEX): HIV Screen 4th Generation wRfx: NONREACTIVE

## 2022-02-05 LAB — C-REACTIVE PROTEIN: CRP: <1 mg/L (ref 0–10)

## 2022-02-05 LAB — CK: Total CK: 173 U/L (ref 41–331)

## 2022-02-05 LAB — RPR: RPR Ser Ql: NONREACTIVE

## 2022-02-05 NOTE — Progress Notes (Signed)
? ? ?Patient referred by Lin Landsman, MD for leg edema, orthostatic hypotension ? ?Subjective:  ? ?Tim Spencer, male    DOB: [birth year], 61 y.o.   MRN: [id number] ? ?Chief Complaint  ?Patient presents with  ? Congestive Heart Failure  ? Follow-up  ?  4 week  ? ? ? ?HPI ? ?61 y.o. [name] male with hypertension, CKD 3, idiopathic progressive neuropathy, orthostatic hypotension/ supine hypertension, hyponatremia. ?  ?Patient is doing well. Na is reportedly staying around 39. Orthostatic hypotension and supine hypertension continues, but much less symptomatic. Cr is up to 3. He has regular f/u w/Dr. Elmarie Shiley.  ? ? ?Current Outpatient Medications:  ?  ALPRAZolam (XANAX) 0.5 MG tablet, Take 0.5 mg by mouth 3 (three) times daily., Disp: , Rfl:  ?  aspirin EC 81 MG EC tablet, Take 1 tablet (81 mg total) by mouth daily. Swallow whole., Disp: 30 tablet, Rfl: 1 ?  atorvastatin (LIPITOR) 20 MG tablet, Take 20 mg by mouth daily after lunch. , Disp: , Rfl:  ?  carvedilol (COREG) 25 MG tablet, Take 1 tablet (25 mg total) by mouth 2 (two) times daily with a meal., Disp: 180 tablet, Rfl: 3 ?  cloNIDine (CATAPRES) 0.2 MG tablet, Take 1 tablet (0.2 mg total) by mouth every evening. (Patient taking differently: Take 0.1 mg by mouth every evening.), Disp: 60 tablet, Rfl: 1 ?  Continuous Blood Gluc Sensor (FREESTYLE LIBRE 2 SENSOR) MISC, Inject into the skin every 14 (fourteen) days., Disp: , Rfl:  ?  diltiazem (CARDIZEM SR) 90 MG 12 hr capsule, Take 1 capsule (90 mg total) by mouth 2 (two) times daily., Disp: 180 capsule, Rfl: 3 ?  DULoxetine (CYMBALTA) 60 MG capsule, Take 1 capsule (60 mg total) by mouth daily., Disp: 30 capsule, Rfl: 11 ?  EUTHYROX 137 MCG tablet, Take 137 mcg by mouth daily., Disp: , Rfl:  ?  ferrous sulfate 325 (65 FE) MG tablet, Take 650 mg by mouth daily after supper. , Disp: , Rfl:  ?  fluticasone (FLONASE) 50 MCG/ACT nasal spray, Place 2 sprays into both nostrils daily., Disp: , Rfl:  ?  gabapentin  (NEURONTIN) 300 MG capsule, Take 300 mg by mouth 3 (three) times daily., Disp: , Rfl:  ?  HUMALOG KWIKPEN 100 UNIT/ML KwikPen, Inject 10-15 Units into the skin 3 (three) times daily before meals. Sliding Scale Insulin, Disp: , Rfl:  ?  Iron-FA-B Cmp-C-Biot-Probiotic (FUSION PLUS) CAPS, Take 1 capsule by mouth daily., Disp: , Rfl:  ?  isosorbide-hydrALAZINE (BIDIL) 20-37.5 MG tablet, Take 1 tablet by mouth 3 (three) times daily., Disp: 30 tablet, Rfl: 1 ?  magnesium oxide (MAG-OX) 400 MG tablet, Take 400 mg by mouth daily after lunch., Disp: , Rfl:  ?  Multiple Vitamin (MULTIVITAMIN WITH MINERALS) TABS tablet, Take 1 tablet by mouth daily after lunch., Disp: , Rfl:  ?  nitroGLYCERIN (NITROSTAT) 0.4 MG SL tablet, Place 1 tablet (0.4 mg total) under the tongue every 5 (five) minutes x 3 doses as needed for chest pain., Disp: 30 tablet, Rfl: 12 ?  Omega-3 1000 MG CAPS, Take 2,000 mg by mouth at bedtime. , Disp: , Rfl:  ?  ondansetron (ZOFRAN-ODT) 4 MG disintegrating tablet, Take 4 mg by mouth 3 (three) times daily., Disp: , Rfl:  ?  torsemide (DEMADEX) 20 MG tablet, Take 1 tablet (20 mg total) by mouth 2 (two) times daily., Disp: 60 tablet, Rfl: 1 ?  TOUJEO SOLOSTAR 300 UNIT/ML Solostar Pen, Inject 30 Units  into the skin daily. 30, Disp: , Rfl:  ?  vitamin B-12 (CYANOCOBALAMIN) 1000 MCG tablet, Take 1,000 mcg by mouth daily., Disp: , Rfl:  ? ? ? ?Cardiovascular and other pertinent studies: ? ?EKG 02/05/2022: ?Sinus rhythm 64 bpm ?Nonspecific T-abnormality ? ?Lexiscan Tetrofosmin stress test 08/23/2021: ?Lexiscan nuclear stress test performed using 1-day protocol. Stress EKG is non-diagnostic, as this is pharmacological stress test. in addition, rest and stress EKG showed sinus rhythm, inferolateral T wave inversion.  ?SPECT images show medium sized, mild intensity, reversible perfusion defect in apical to basal, inferior/inferolateral myocardium.  Stress LVEF 50% ?Low risk study. ? ?Echocardiogram 07/22/2021: ? 1. Left  ventricular ejection fraction, by estimation, is 55 to 60%. The  ?left ventricle has normal function. The left ventricle has no regional  ?wall motion abnormalities. There is mild left ventricular hypertrophy.  ?Left ventricular diastolic parameters are consistent with Grade II diastolic dysfunction (pseudonormalization).  ? 2. Right ventricular systolic function is normal. The right ventricular  ?size is normal.  ? 3. Left atrial size was mildly dilated.  ? 4. The mitral valve is normal in structure. Trivial mitral valve  ?regurgitation. No evidence of mitral stenosis.  ? 5. The aortic valve is tricuspid. Aortic valve regurgitation is not  ?visualized. No aortic stenosis is present.  ? 6. The inferior vena cava is normal in size with greater than 50%  ?respiratory variability, suggesting right atrial pressure of 3 mmHg.  ? ?Carotid artery duplex  06/20/2020:  ?Minimal stenosis in the right internal carotid artery (1-15%).  ?Minimal stenosis in the left internal carotid artery (1-15%).  ?Mild heterogeneous plaque noted in R>L carotid arteries.  ?Antegrade right vertebral artery flow. Antegrade left vertebral artery  ?Flow. ? ?EKG 06/06/2020: ?Sinus rhythm 70 bpm ?Occaisonal PVC ? ? ?Recent labs: ?07/25/2021: ?Glucose 329, BUN/Cr 26/2.2. EGFR 33. Na/K 132/4.1. Albumin 2.6 ?H/H 7.9/23.4. MCV 90. Platelets 167 ?HbA1C 6.9% ?Chol 134, TG 40, HDL 44, LDL 82 ?TSH 4.4 normal ? ?Immunofixation: ?The immunofixation pattern appears unremarkable. Evidence of  monoclonal protein is not apparent.  ?Kappa FLC 61 (3.3-10.4) ?Lambda FLC 29 (5.7-26.3) ?Kappa/Lambda ratio 2.1 (0.26-1.65) ? ?ANA negative, complements normal ? ?07/18/2020: ?Glucose 130 BUN/creatinine 27/1.6.  eGFR 47/56.  NA/K 130/4.5. ?H/H 10.6/29.8 MCV 84.  Platelets 262. ?Serum osmolality 267 (275-295) ? ?06/30/2020: ?Glucose 169, BUN/Cr 26/2.0. EGFR 36. Na/K 125/5.5. Chloride 92. Ca 10.1. Rest of the CMP normal ?H/H 10.6/29.8. MCV 84. Platelets 262 ?HbA1C 7.3% ?Chol  99, TG 83, HDL 41, LDL 45 ?TSH 11.2 high ?  ?05/10/2020: ?Glucose 230, BUN/Cr 33/1.84. EGFR 39. Na/K 127/5.3. Cl 92.  ?  ?03/29/2020: ?BNP 183 ? ?03/29/2020: ?Glucose 131, BUN/Cr 24/1.6. EGFR 47. Na/K 135/4.9.  ALB: 3.3 ?Chol 117, TG 40, HDL 53, LDL 56 ?TSH 6.48 normal ?BNP 183 ? ? ? ?Review of Systems  ?Constitutional: Negative for malaise/fatigue.  ?Cardiovascular:  Negative for chest pain, dyspnea on exertion, leg swelling, palpitations and syncope.  ?Musculoskeletal:   ?     Right toe pain  ?Neurological:  Positive for dizziness and light-headedness.  ? ?   ? ? ?Vitals:  ? 02/05/22 1420 02/05/22 1429  ?BP: (!) 172/81 (!) 177/82  ?Pulse: 68 65  ?Resp: 16   ?Temp: 98.2 ?F (36.8 ?C)   ?SpO2: 97%   ? ? ? ?Body mass index is 27.72 kg/m?. ?Filed Weights  ? 02/05/22 1420  ?Weight: 177 lb (80.3 kg)  ? ? ? ?Objective:  ? Physical Exam ?Vitals and nursing note reviewed.  ?  Constitutional:   ?   General: He is not in acute distress. ?Neck:  ?   Vascular: No JVD.  ?Cardiovascular:  ?   Rate and Rhythm: Normal rate and regular rhythm.  ?   Pulses:     ?     Carotid pulses are  on the right side with bruit. ?     Dorsalis pedis pulses are 2+ on the right side and 2+ on the left side.  ?     Posterior tibial pulses are 2+ on the right side and 2+ on the left side.  ?   Heart sounds: Normal heart sounds. No murmur heard. ?Pulmonary:  ?   Effort: Pulmonary effort is normal.  ?   Breath sounds: Normal breath sounds. No wheezing or rales.  ?Musculoskeletal:  ?   Right lower leg: No edema.  ?   Left lower leg: No edema.  ? ? ? ? ?   ? ?Assessment & Recommendations:  ? ?61 y.o. [name] male with hypertension, CKD 3, idiopathic progressive neuropathy, orthostatic hypotension/ supine hypertension, hyponatremia. ? ?Orthostatic hypotension/supine hypertension: ?Likely related to autonomic dysfunction in diabetic patient.  ?Leg edema likely combination of HFpEF and medication side effects. ?Continue diltiazem 180 mg daily, Carvedilol  25 mg bid. ? ?Hyponatremia: ?Currently resolved.  ? ?Abnormal stress test: ?Mild inferior ischemia. No angina symptoms at this time. ?Continue medical management.  ? ?F/u in 6 months ? ? ? ?Safire Gordin J Patw

## 2022-02-06 ENCOUNTER — Ambulatory Visit: Payer: BC Managed Care – PPO | Admitting: Physical Therapy

## 2022-02-07 ENCOUNTER — Other Ambulatory Visit: Payer: Self-pay

## 2022-02-07 ENCOUNTER — Ambulatory Visit: Payer: BC Managed Care – PPO | Attending: Neurology | Admitting: Physical Therapy

## 2022-02-07 ENCOUNTER — Telehealth: Payer: Self-pay | Admitting: Neurology

## 2022-02-07 DIAGNOSIS — M79605 Pain in left leg: Secondary | ICD-10-CM | POA: Insufficient documentation

## 2022-02-07 DIAGNOSIS — G8929 Other chronic pain: Secondary | ICD-10-CM | POA: Diagnosis not present

## 2022-02-07 DIAGNOSIS — R2689 Other abnormalities of gait and mobility: Secondary | ICD-10-CM

## 2022-02-07 DIAGNOSIS — M5442 Lumbago with sciatica, left side: Secondary | ICD-10-CM | POA: Insufficient documentation

## 2022-02-07 DIAGNOSIS — M79604 Pain in right leg: Secondary | ICD-10-CM

## 2022-02-07 DIAGNOSIS — G6289 Other specified polyneuropathies: Secondary | ICD-10-CM | POA: Diagnosis not present

## 2022-02-07 DIAGNOSIS — R269 Unspecified abnormalities of gait and mobility: Secondary | ICD-10-CM | POA: Diagnosis not present

## 2022-02-07 DIAGNOSIS — M6281 Muscle weakness (generalized): Secondary | ICD-10-CM | POA: Diagnosis present

## 2022-02-07 DIAGNOSIS — R2681 Unsteadiness on feet: Secondary | ICD-10-CM | POA: Diagnosis present

## 2022-02-07 NOTE — Patient Instructions (Signed)
Access Code: [email or web address] ?URL: [email or web address] ?Date: 02/07/2022 ?Prepared by: [facility] ? ?Exercises ?- Seated Hamstring Stretch  - 2 x daily - 7 x weekly - 1 sets - 3 reps - 15-30 sec hold ?

## 2022-02-07 NOTE — Telephone Encounter (Signed)
Pt called stating that his PT appt is at 2:30 pm today and is wanting to know if he is ok going since he has an MRI of the spine and lumbar to be done. Please advise.  ?

## 2022-02-07 NOTE — Therapy (Signed)
OUTPATIENT PHYSICAL THERAPY NEURO EVALUATION   Patient Name: Tim Spencer MRN: [id number] DOB:Jan 31, 1961, 61 y.o., male Today's Date: 02/08/2022  PCP: Meda Klinefelter, MD REFERRING PROVIDER: Marcial Pacas, MD    PT End of Session - 02/08/22 [phone]     Visit Number 1    Number of Visits 13    Date for PT Re-Evaluation 03/22/22    Authorization Type BCBS; 27 VL/0 used at eval    Authorization - Visit Number 1    Authorization - Number of Visits 20    PT Start Time 1500    PT Stop Time 7494    PT Time Calculation (min) 55 min    Activity Tolerance Patient tolerated treatment well    Behavior During Therapy WFL for tasks assessed/performed             Past Medical History:  Diagnosis Date   Anemia    CKD (chronic kidney disease) stage 3, GFR 30-59 ml/min (HCC)    Hypertension    Idiopathic progressive neuropathy    Secondary hyperparathyroidism, renal ([place])    Type 2 diabetes mellitus ([place])    Past Surgical History:  Procedure Laterality Date   GIVENS CAPSULE STUDY N/A 09/01/2020   Procedure: GIVENS CAPSULE STUDY;  Surgeon: Juanita Craver, MD;  Location[facility] ENDOSCOPY;  Service: Endoscopy;  Laterality: N/A;   Patient Active Problem List   Diagnosis Date Noted   Gait abnormality 01/31/2022   Peripheral neuropathy 01/31/2022   Chronic left-sided low back pain with left-sided sciatica 01/31/2022   Abnormal stress test 08/27/2021   Heart failure (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)    NSTEMI (non-ST elevated myocardial infarction) ([place]) 07/21/2021   AKI (acute kidney injury) ([place])    Bilateral lower extremity edema    Anemia    Hypertensive urgency 06/28/2021   Chronic kidney disease, stage 3a ([place])    Insulin-requiring or dependent type II  diabetes mellitus ([place])    Hypothyroidism    Hyponatremia 07/05/2020   Orthostatic hypotension 06/06/2020   Bruit of right carotid artery 06/06/2020    ONSET DATE: 01/31/2022   REFERRING DIAG: R26.9 (ICD-10-CM) - Gait abnormality G62.89 (ICD-10-CM) - Other polyneuropathy M54.42,G89.29 (ICD-10-CM) - Chronic left-sided low back pain with left-sided sciatica   THERAPY DIAG:  Muscle weakness (generalized)  Other abnormalities of gait and mobility  Unsteadiness on feet  Pain in both lower extremities  SUBJECTIVE:  SUBJECTIVE STATEMENT: Pt reports he is awaiting MRI for back pain; he has pain in R buttocks and posterior R leg.  He reports pain in bilat feet, unsteadiness, decreased balance; R has less pain than L.  He reports he cannot stand without UE support for > 1 minute.  He uses rollator in community, cane in home. Pt accompanied by: self and significant other  PERTINENT HISTORY: PMH:  DM, orthostatic hypotension, retinopathyhypertension, CKD 3, idiopathic progressive neuropathy, orthostatic hypotension/ supine hypertension, hyponatremia.   PAIN:  Are you having pain? Yes: NPRS scale: 8-10/10 Pain location: R buttocks/hip; bilateral feet Pain description: aching Aggravating factors: sitting on hard surfaces, first waking up Relieving factors: walking, less pressure through R hip Awaiting MRI-PT will attempt to address pain through exercise   PRECAUTIONS: Fall and Other: orthostatic hypotension   FALLS: Has patient fallen in last 6 months? Yes. Number of falls 1  LIVING ENVIRONMENT: Lives with: lives with their family Lives in: House/apartment Stairs: Yes: Internal: 2; 13 steps; can reach both Has following equipment at home: Single point cane and Walker - 4 wheeled  PLOF: Independent  with household mobility with device and Independent with community mobility with device  PATIENT GOALS Pt's goals for therapy are to walk, stand with less support, help with the pain.  OBJECTIVE:   DIAGNOSTIC FINDINGS: Awaiting MRI  COGNITION: Overall cognitive status: Within functional limits for tasks assessed   SENSATION: Light touch: Impaired less sensation LLE and less sensation distally  COORDINATION: Decreased coordination distally L toes, difficulty curling/flexing toes  PALPATION:  Pt tender to palpation at R great toe and R plantar fascia   POSTURE: rounded shoulders, forward head, and posterior pelvic tilt  BLE ROM:   tightness in bilateral hamstrings; ankle dorsiflexion slightly past neutral.  In sitting, pt c/o pain in bilateral hamstrings with full knee extension   MMT:    MMT Right 02/08/2022 Left 02/08/2022  Hip flexion 4 4  Hip extension    Hip abduction 4 4  Hip adduction 4 4  Hip internal rotation    Hip external rotation    Knee flexion 4 4  Knee extension 4 4  Ankle dorsiflexion 4 4  Ankle plantarflexion    Ankle inversion    Ankle eversion    (Blank rows = not tested)   TRANSFERS: Assistive device utilized: Environmental consultant - 4 wheeled  Sit to stand: Modified independence Stand to sit: Modified independence    GAIT: Gait pattern: step through pattern, decreased ankle dorsiflexion- Right, and decreased ankle dorsiflexion- Left Distance walked: short distances in clinic Assistive device utilized: Environmental consultant - 4 wheeled Level of assistance: Modified independence Comments: Increased reliance on UEs through rollator  FUNCTIONAL TESTs:  5 times sit to stand: 35.75 no UE support, increased Bilat feet pain Standing balance:  EO feet apart 30 seconds, increased sway; EC feet apart 20 sec, increased sway  TODAY'S TREATMENT:  Answered pt's questions regarding pain and tightness in R foot Educated pt on use of foam roller for tightness and pain; as well as  rationale for good stretching HEP  Answered pt's questions regarding pain and tightness in R buttocks and posterior R thigh-pt to have MRI, but also will need to further assess in therapy Performed seated hamstring stretch-see HEP instructions and added to HEP    PATIENT EDUCATION: Education details: PT eval results and POC; initiated HEP Person educated: Patient and Spouse Education method: Explanation, Demonstration, Verbal cues, and Handouts Education comprehension: verbalized understanding, returned demonstration, and  needs further education   HOME EXERCISE PROGRAM: Access Code: [email or web address] URL: [email or web address] Date: 02/08/2022 Prepared by: [facility]  Exercises - Seated Hamstring Stretch  - 2 x daily - 7 x weekly - 1 sets - 3 reps - 15-30 sec hold    GOALS: Goals reviewed with patient? Yes  SHORT TERM GOALS: Target date: 03/08/2022  Pt will be independent with HEP for improved balance, gait, strength. Baseline: Goal status: INITIAL  2.  Pt will improve 5x sit<>stand to less than or equal to 30 sec to demonstrate improved functional strength and transfer efficiency.  Baseline: 35.75 Goal status: INITIAL  LONG TERM GOALS: Target date: 03/22/2022  Pt will be independent with HEP for improved balance, gait, strength. Baseline:  Goal status: INITIAL  2.  Pt will improve 5x sit<>stand to less than or equal to 25 sec to demonstrate improved functional strength and transfer efficiency.  Baseline:  Goal status: INITIAL  3.  Pt will perform 30 seconds standing eyes closed, for improved balance. Baseline:  Goal status: INITIAL  4.  Berg Balance score to be assessed, with score to improve by at least 5 points. Baseline:  Goal status: INITIAL  5.  Pt will report decrease in pain by at least 50% for improved overall functional mobility, sleep. Baseline: pain 8-10/10 posterior R thigh and buttocks Goal status:  INITIAL  ASSESSMENT:  CLINICAL IMPRESSION: Patient is a 61 y.o. male who was seen today for physical therapy evaluation and treatment for gait instability with hx of peripheral neuropathy.  Pt is also experiencing pain in R posterior thigh and buttocks (not able to be fully assessed today due to time constraints of eval), and pt is awaiting MRI to be scheduled to see if low back involvement is contributing.  Pt has had remote hx of falls, but not in the past 6 months.   He presents with decreased strength, decreased balance, dependence on assistive device for gait, pain in lower extremities, which limits participation in daily activities.  He will benefit from skilled PT to address these stated deficits to improve overall functional mobility and decrease fall risk.  Pt is also experiencing neuropathy in BUEs, which is affecting fine motor and coordination; he would benefit from OT services.   OBJECTIVE IMPAIRMENTS Abnormal gait, decreased balance, decreased mobility, difficulty walking, decreased ROM, decreased strength, impaired flexibility, postural dysfunction, and pain.   ACTIVITY LIMITATIONS/PARTICIPATION LIMITATIONS: community activity, driving, and locomotion, transfers, standing for ADLs .   PERSONAL FACTORS PMH:  DM, orthostatic hypotension, retinopathyhypertension, CKD 3, idiopathic progressive neuropathy, orthostatic hypotension/ supine hypertension, hyponatremia.  are also affecting patient's functional outcome.    REHAB POTENTIAL: Good  CLINICAL DECISION MAKING: Evolving/moderate complexity  EVALUATION COMPLEXITY: Moderate  PLAN: PT FREQUENCY: 2x/week  PT DURATION: 6 weeks  PLANNED INTERVENTIONS: Therapeutic exercises, Therapeutic activity, Neuromuscular re-education, Balance training, Gait training, Patient/Family education, Joint mobilization, DME instructions, and Manual therapy  PLAN FOR NEXT SESSION: REview initial HEP; need to assess Berg and further assess R buttock/hip  pain-try to address with stretches.   Sota Hetz W., PT 02/08/2022, 9:03 AM

## 2022-02-13 ENCOUNTER — Telehealth: Payer: Self-pay | Admitting: Neurology

## 2022-02-13 NOTE — Telephone Encounter (Signed)
Both were sent to GI they will call the patient to schedule

## 2022-02-20 ENCOUNTER — Telehealth: Payer: Self-pay | Admitting: Neurology

## 2022-02-20 DIAGNOSIS — G6289 Other specified polyneuropathies: Secondary | ICD-10-CM

## 2022-02-20 MED ORDER — DULOXETINE HCL 20 MG PO CPEP
ORAL_CAPSULE | ORAL | 11 refills | Status: DC
Start: 2022-02-20 — End: 2022-06-28

## 2022-02-20 NOTE — Addendum Note (Signed)
Addended by: Lester [place] A on: 02/20/2022 04:48 PM   Modules accepted: Orders

## 2022-02-20 NOTE — Telephone Encounter (Signed)
I called patient.  I advised him that Dr. Krista Blue has changed his duloxetine prescription to 20 mg in the morning for 1 month then 2 capsules in the morning for the next month then 3 capsules in the morning thereafter.  I advised him to let us know if he has further side effects.  Patient verbalized understanding and appreciation.

## 2022-02-20 NOTE — Telephone Encounter (Signed)
I called patient.  He reports that since starting duloxetine 60 mg he has been feeling nauseous and he has a burning sensation in his stomach and chest.  He spoke with his pharmacist who recommended that he take duloxetine 20 mg capsules 3 times a day instead.  He would like to know if Dr. Krista Blue is agreeable to this.  He thinks this will help his symptoms.

## 2022-02-20 NOTE — Telephone Encounter (Signed)
Meds ordered this encounter  Medications   DULoxetine (CYMBALTA) 20 MG capsule    Sig: One tab qam x one month, then 2 tabs for one month, then 3 tabs qam    Dispense:  90 capsule    Refill:  11

## 2022-02-20 NOTE — Telephone Encounter (Signed)
Pt called needing to discuss poss lower dosage for his DULoxetine (CYMBALTA) 60 MG capsule Please advise.

## 2022-02-22 ENCOUNTER — Ambulatory Visit: Payer: BC Managed Care – PPO | Attending: Neurology | Admitting: Physical Therapy

## 2022-02-22 ENCOUNTER — Encounter: Payer: Self-pay | Admitting: Physical Therapy

## 2022-02-22 DIAGNOSIS — M6281 Muscle weakness (generalized): Secondary | ICD-10-CM | POA: Insufficient documentation

## 2022-02-22 DIAGNOSIS — M79604 Pain in right leg: Secondary | ICD-10-CM | POA: Diagnosis present

## 2022-02-22 DIAGNOSIS — R2689 Other abnormalities of gait and mobility: Secondary | ICD-10-CM | POA: Diagnosis present

## 2022-02-22 DIAGNOSIS — R2681 Unsteadiness on feet: Secondary | ICD-10-CM | POA: Diagnosis present

## 2022-02-22 DIAGNOSIS — M79605 Pain in left leg: Secondary | ICD-10-CM | POA: Diagnosis present

## 2022-02-22 NOTE — Patient Instructions (Signed)
Access Code: [email or web address] URL: [email or web address] Date: 02/22/2022 Prepared by: [facility]  Exercises - Seated Hamstring Stretch  - 2 x daily - 7 x weekly - 1 sets - 3 reps - 15-30 sec hold - Hooklying Single Knee to Chest  - 1-2 x daily - 7 x weekly - 1 sets - 3 reps - 15-30 sec hold - Supine Lower Trunk Rotation  - 1-2 x daily - 7 x weekly - 1 sets - 5 reps - 15 sec hold - Supine Figure 4 Piriformis Stretch  - 1-2 x daily - 7 x weekly - 1 sets - 3 reps - 15 sec hold

## 2022-02-22 NOTE — Therapy (Signed)
OUTPATIENT PHYSICAL THERAPY TREATMENT NOTE   Patient Name: Tim Spencer MRN: [id number] DOB:Feb 13, 1961, 61 y.o., male Today's Date: 02/22/2022  PCP: Meda Klinefelter, MD REFERRING PROVIDER: Marcial Pacas, MD   END OF SESSION:   PT End of Session - 02/22/22 1451     Visit Number 2    Number of Visits 13    Date for PT Re-Evaluation 03/22/22    Authorization Type BCBS; 54 VL/0 used at eval    Authorization - Visit Number 2    Authorization - Number of Visits 20    PT Start Time 1950    PT Stop Time 1534    PT Time Calculation (min) 43 min    Activity Tolerance Patient tolerated treatment well    Behavior During Therapy WFL for tasks assessed/performed             Past Medical History:  Diagnosis Date   Anemia    CKD (chronic kidney disease) stage 3, GFR 30-59 ml/min (HCC)    Hypertension    Idiopathic progressive neuropathy    Secondary hyperparathyroidism, renal ([place])    Type 2 diabetes mellitus ([place])    Past Surgical History:  Procedure Laterality Date   GIVENS CAPSULE STUDY N/A 09/01/2020   Procedure: GIVENS CAPSULE STUDY;  Surgeon: Juanita Craver, MD;  Location: [facility] ENDOSCOPY;  Service: Endoscopy;  Laterality: N/A;   Patient Active Problem List   Diagnosis Date Noted   Gait abnormality 01/31/2022   Peripheral neuropathy 01/31/2022   Chronic left-sided low back pain with left-sided sciatica 01/31/2022   Abnormal stress test 08/27/2021   Heart failure ([place]) 08/14/2021   Supine hypertension 08/14/2021   Angina pectoris ([place]) 08/14/2021   Acute heart failure with preserved ejection fraction (HFpEF) (HCC)    Elevated troponin    Left carotid bruit    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)    NSTEMI (non-ST elevated myocardial infarction) ([place]) 07/21/2021   AKI (acute kidney injury) ([place])    Bilateral lower extremity edema    Anemia    Hypertensive urgency 06/28/2021   Chronic kidney disease, stage 3a ([place])    Insulin-requiring or  dependent type II diabetes mellitus ([place])    Hypothyroidism    Hyponatremia 07/05/2020   Orthostatic hypotension 06/06/2020   Bruit of right carotid artery 06/06/2020    REFERRING DIAG: R26.9 (ICD-10-CM) - Gait abnormality G62.89 (ICD-10-CM) - Other polyneuropathy M54.42,G89.29 (ICD-10-CM) - Chronic left-sided low back pain with left-sided sciatica   THERAPY DIAG:  Unsteadiness on feet  Muscle weakness (generalized)  Other abnormalities of gait and mobility  Rationale for Evaluation and Treatment Rehabilitation  PERTINENT HISTORY:  PMH:  DM, orthostatic hypotension, retinopathyhypertension, CKD 3, idiopathic progressive neuropathy, orthostatic hypotension/ supine hypertension, hyponatremia.   PRECAUTIONS: Fall and Other: orthostatic hypotension  SUBJECTIVE: Tried the exercise, pain is still there at night.  Sometimes have spasms.    PAIN:  Are you having pain? Yes: NPRS scale: 9/10 Pain location: L foot Pain description: heaviness Aggravating factors: unsure-trying medication Relieving factors: unsure   OBJECTIVE:    TODAY'S TREATMENT: 02/22/2022 Activity Comments  Berg Score:  23/56 Scores <45/56 indicate increased fall risk  SKTC, 3 x 15 seconds, RLE   Trunk rotation R and L, 3 reps each, with additional reps for rocking   Supine piriformis stretch, RLE, 3 x 15 sec Educated in position for gentle stretch position, stretches shouldn't bring on pain         Supine ROM  measures: Hamstring ROM:  RLE -45 degrees from neutral, LLE -60 from neutral with resistance  SLR:  35 degrees RLE with pain; 35 degrees LLE with spasms, but no pain.    [place] Adult PT Treatment/Exercise - 02/22/22 0001       Standardized Balance Assessment   Standardized Balance Assessment Berg Balance Test      Berg Balance Test   Sit to Stand Able to stand  independently using hands    Standing Unsupported Able to stand 2 minutes with supervision    Sitting with Back Unsupported but Feet  Supported on Floor or Stool Able to sit safely and securely 2 minutes    Stand to Sit Controls descent by using hands    Transfers Able to transfer safely, definite need of hands    Standing Unsupported with Eyes Closed Able to stand 3 seconds    Standing Ubsupported with Feet Together Needs help to attain position and unable to hold for 15 seconds    From Standing, Reach Forward with Outstretched Arm Reaches forward but needs supervision    From Standing Position, Pick up Object from Floor Unable to try/needs assist to keep balance    From Standing Position, Turn to Look Behind Over each Shoulder Turn sideways only but maintains balance    Turn 360 Degrees Needs close supervision or verbal cueing   >20 sec   Standing Unsupported, Alternately Place Feet on Step/Stool Needs assistance to keep from falling or unable to try    Standing Unsupported, One Foot in Front Needs help to step but can hold 15 seconds    Standing on One Leg Unable to try or needs assist to prevent fall    Total Score 23             Access Code: [email or web address] URL: [email or web address] Date: 02/22/2022 Prepared by: [facility]  Exercises - Seated Hamstring Stretch  - 2 x daily - 7 x weekly - 1 sets - 3 reps - 15-30 sec hold - Hooklying Single Knee to Chest  - 1-2 x daily - 7 x weekly - 1 sets - 3 reps - 15-30 sec hold - Supine Lower Trunk Rotation  - 1-2 x daily - 7 x weekly - 1 sets - 5 reps - 15 sec hold - Supine Figure 4 Piriformis Stretch  - 1-2 x daily - 7 x weekly - 1 sets - 3 reps - 15 sec hold   PATIENT EDUCATION: Education details: HEP additions-see above; asked how stretches went from eval.  Pt has not yet used roller or water bottle for foot/lower leg stretch.  He has questions about continued pain in feet-possible neuropathy related, doesn't seem related to LBP.  Discussed possibility of desensitization techniques with massage to feet. Person educated:  Patient and Spouse Education method: Explanation, Demonstration, and Handouts Education comprehension: verbalized understanding and returned demonstration  GOALS: Goals reviewed with patient? Yes   SHORT TERM GOALS: Target date: 03/08/2022   Pt will be independent with HEP for improved balance, gait, strength. Baseline: Goal status: IN PROGRESS   2.  Pt will improve 5x sit<>stand to less than or equal to 30 sec to demonstrate improved functional strength and transfer efficiency.   Baseline: 35.75 Goal status: IN PROGRESS   LONG TERM GOALS: Target date: 03/22/2022   Pt will be independent with HEP for improved balance, gait, strength. Baseline:  Goal status: IN PROGRESS   2.  Pt will improve 5x  sit<>stand to less than or equal to 25 sec to demonstrate improved functional strength and transfer efficiency.   Baseline:  Goal status: IN PROGRESS   3.  Pt will perform 30 seconds standing eyes closed, for improved balance. Baseline:  Goal status: IN PROGRESS   4.  Berg Balance score to be assessed, with score to improve by at least 5 points. Baseline: 23/56 02/22/2022 Goal status: IN PROGRESS   5.  Pt will report decrease in pain by at least 50% for improved overall functional mobility, sleep. Baseline: pain 8-10/10 posterior R thigh and buttocks Goal status: IN PROGRESS   ASSESSMENT:   CLINICAL IMPRESSION: Berg Balance test assessed this visit, with pt scoring 23/56, indicating increased fall risk and optimal mobility device is a walker.  Pt uses either rollator or RW most of the time at home, per report.  Performed SLR test and hamstring flexibility in supine, with signficant tightness, resistance to motion on LLE and pain in buttocks and posterior thigh in RLE.  Addressed with gentle stretching as part of HEP.  Pt is scheduled to have MRI and ultrasound in coming weeks to further assess low back.  Pt would benefit from skilled PT to address multi-factorial balance deficits,  flexibility, and gait.       OBJECTIVE IMPAIRMENTS Abnormal gait, decreased balance, decreased mobility, difficulty walking, decreased ROM, decreased strength, impaired flexibility, postural dysfunction, and pain.    ACTIVITY LIMITATIONS/PARTICIPATION LIMITATIONS: community activity, driving, and locomotion, transfers, standing for ADLs .    PERSONAL FACTORS PMH:  DM, orthostatic hypotension, retinopathyhypertension, CKD 3, idiopathic progressive neuropathy, orthostatic hypotension/ supine hypertension, hyponatremia.  are also affecting patient's functional outcome.      REHAB POTENTIAL: Good   CLINICAL DECISION MAKING: Evolving/moderate complexity   EVALUATION COMPLEXITY: Moderate   PLAN: PT FREQUENCY: 2x/week   PT DURATION: 6 weeks   PLANNED INTERVENTIONS: Therapeutic exercises, Therapeutic activity, Neuromuscular re-education, Balance training, Gait training, Patient/Family education, Joint mobilization, DME instructions, and Manual therapy   PLAN FOR NEXT SESSION: Review updates to HEP; lower extremity stretches, pt has questions about tightness, tenderness at R plantar fascia -may try to address with stretching (ask if pt has done the foam or bottle roll yet)  Maico Mulvehill W., PT 02/22/2022, 4:46 PM

## 2022-02-26 NOTE — Therapy (Signed)
OUTPATIENT PHYSICAL THERAPY TREATMENT NOTE   Patient Name: Tim Spencer MRN: [id number] DOB:05/12/1961, 61 y.o., male Today's Date: 02/27/2022  PCP: Meda Klinefelter, MD REFERRING PROVIDER: Marcial Pacas, MD   END OF SESSION:   PT End of Session - 02/27/22 1630     Visit Number 3    Number of Visits 13    Date for PT Re-Evaluation 03/22/22    Authorization Type BCBS; 76 VL/0 used at eval    Authorization - Visit Number 3    Authorization - Number of Visits 20    PT Start Time 1446    PT Stop Time 1535    PT Time Calculation (min) 49 min    Activity Tolerance Patient tolerated treatment well    Behavior During Therapy WFL for tasks assessed/performed              Past Medical History:  Diagnosis Date   Anemia    CKD (chronic kidney disease) stage 3, GFR 30-59 ml/min (HCC)    Hypertension    Idiopathic progressive neuropathy    Secondary hyperparathyroidism, renal ([place])    Type 2 diabetes mellitus ([place])    Past Surgical History:  Procedure Laterality Date   GIVENS CAPSULE STUDY N/A 09/01/2020   Procedure: GIVENS CAPSULE STUDY;  Surgeon: Juanita Craver, MD;  Location: [facility] END[facility];  Service: Endoscopy;  Laterality: N/A;   Patient Active Problem List   Diagnosis Date Noted   Gait abnormality 01/31/2022   Peripheral neuropathy 01/31/2022   Chronic left-sided low back pain with left-sided sciatica 01/31/2022   Abnormal stress test 08/27/2021   Heart failure ([place]) 08/14/2021   Supine hypertension 08/14/2021   Angina pectoris ([place]) 08/14/2021   Acute heart failure with preserved ejection fraction (HFpEF) (HCC)    Elevated troponin    Left carotid bruit    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)    NSTEMI (non-ST elevated myocardial infarction) ([place]) 07/21/2021   AKI (acute kidney injury) ([place])    Bilateral lower extremity edema    Anemia    Hypertensive urgency 06/28/2021   Chronic kidney disease, stage 3a ([place])    Insulin-requiring or  dependent type II diabetes mellitus ([place])    Hypothyroidism    Hyponatremia 07/05/2020   Orthostatic hypotension 06/06/2020   Bruit of right carotid artery 06/06/2020    REFERRING DIAG: R26.9 (ICD-10-CM) - Gait abnormality G62.89 (ICD-10-CM) - Other polyneuropathy M54.42,G89.29 (ICD-10-CM) - Chronic left-sided low back pain with left-sided sciatica   THERAPY DIAG:  Unsteadiness on feet  Muscle weakness (generalized)  Other abnormalities of gait and mobility  Pain in both lower extremities  Rationale for Evaluation and Treatment Rehabilitation  PERTINENT HISTORY:  PMH:  DM, orthostatic hypotension, retinopathyhypertension, CKD 3, idiopathic progressive neuropathy, orthostatic hypotension/ supine hypertension, hyponatremia.   PRECAUTIONS: Fall and Other: orthostatic hypotension  SUBJECTIVE: Doing his exercises at home. Pain is worse when laying down, better when standing/walking. Has been working on rolling a water bottle under his foot- not seeing progress yet.   PAIN:  Are you having pain? Yes: NPRS scale: 8-9/10 Pain location: L foot Pain description: heaviness Aggravating factors: unsure-trying medication Relieving factors: unsure   OBJECTIVE:     TODAY'S TREATMENT: 02/27/22 Activity Comments  Nustep L4 x 6 min (Ues/Les) Slow; cues to continue during conversation   Supine sciatic nerve glide 2x10 each Very limited HS length B  pelvic tilts supine/sitting Limited lumbar flexion; cueing for proper movement pattern- most success in sitting  Prayer stretch with green pball 5x3" Cues tp avpid pushing into pain   STM and TPR to R proximal glutes and piriformis Good twitch response R piriformis   R fig 4 and KTOS 30" each Cues to avoid pushing into pain  beginner bridge 10x Limited ROM d/t focusing on glute contraction   Prone on elbows 10x3" Reported some back pain but tolerable    PATIENT EDUCATION: Education details: answering patient's questions on use of cardio  equipment at home for fitness; answering other questions about effects of DM and lumbar radiculopathy on LEs, update to HEP Person educated: Patient and Spouse Education method: Explanation, Demonstration, Tactile cues, Verbal cues, and Handouts Education comprehension: verbalized understanding and returned demonstration    Access Code: [email or web address] URL: [email or web address] Date: 02/27/2022 Prepared by: [facility]  Exercises - Seated Hamstring Stretch  - 2 x daily - 7 x weekly - 1 sets - 3 reps - 15-30 sec hold - Hooklying Single Knee to Chest  - 1-2 x daily - 7 x weekly - 1 sets - 3 reps - 15-30 sec hold - Supine Lower Trunk Rotation  - 1-2 x daily - 7 x weekly - 1 sets - 5 reps - 15 sec hold - Supine Figure 4 Piriformis Stretch  - 1-2 x daily - 7 x weekly - 1 sets - 3 reps - 15 sec hold - Supine Piriformis Stretch with Foot on Ground  - 1 x daily - 5 x weekly - 2 sets - 30 sec hold - Seated Pelvic Tilt  - 1 x daily - 5 x weekly - 2 sets - 10 reps    GOALS: Goals reviewed with patient? Yes   SHORT TERM GOALS: Target date: 03/08/2022   Pt will be independent with HEP for improved balance, gait, strength. Baseline: Goal status: IN PROGRESS   2.  Pt will improve 5x sit<>stand to less than or equal to 30 sec to demonstrate improved functional strength and transfer efficiency.   Baseline: 35.75 Goal status: IN PROGRESS   LONG TERM GOALS: Target date: 03/22/2022   Pt will be independent with HEP for improved balance, gait, strength. Baseline:  Goal status: IN PROGRESS   2.  Pt will improve 5x sit<>stand to less than or equal to 25 sec to demonstrate improved functional strength and transfer efficiency.   Baseline:  Goal status: IN PROGRESS   3.  Pt will perform 30 seconds standing eyes closed, for improved balance. Baseline:  Goal status: IN PROGRESS   4.  Berg Balance score to be assessed, with score to improve by at least  5 points. Baseline: 23/56 02/22/2022 Goal status: IN PROGRESS   5.  Pt will report decrease in pain by at least 50% for improved overall functional mobility, sleep. Baseline: pain 8-10/10 posterior R thigh and buttocks Goal status: IN PROGRESS   ASSESSMENT:   CLINICAL IMPRESSION: Patient without new complaints at start of session. Reports HEP compliance. Worked on gentle lumbopelvic ROM within patient's tolerance. Initiated MT to address soft tissue restriction in R buttock; patient with good twitch response in piriformis. Proceeded with piriformis stretching for muscle lengthening. Patient tolerated initiation of lumbar extension exercise well today- noted some LBP but tolerable. Reported understanding of HEP and without complaints upon leaving.      OBJECTIVE IMPAIRMENTS Abnormal gait, decreased balance, decreased mobility, difficulty walking, decreased ROM, decreased strength, impaired flexibility, postural dysfunction, and pain.    ACTIVITY LIMITATIONS/PARTICIPATION LIMITATIONS: community activity,  driving, and locomotion, transfers, standing for ADLs .    PERSONAL FACTORS PMH:  DM, orthostatic hypotension, retinopathyhypertension, CKD 3, idiopathic progressive neuropathy, orthostatic hypotension/ supine hypertension, hyponatremia.  are also affecting patient's functional outcome.      REHAB POTENTIAL: Good   CLINICAL DECISION MAKING: Evolving/moderate complexity   EVALUATION COMPLEXITY: Moderate   PLAN: PT FREQUENCY: 2x/week   PT DURATION: 6 weeks   PLANNED INTERVENTIONS: Therapeutic exercises, Therapeutic activity, Neuromuscular re-education, Balance training, Gait training, Patient/Family education, Joint mobilization, DME instructions, and Manual therapy   PLAN FOR NEXT SESSION: lower extremity stretches, pt has questions about tightness, tenderness at R plantar fascia -may try to address with stretching (pt reports he has done the foam or bottle roll)  Janene Harvey,  PT, DPT 02/27/22 4:34 PM  [facility] [address], [place], [place] [address] Phone # [phone] Fax # [phone]

## 2022-02-27 ENCOUNTER — Encounter: Payer: Self-pay | Admitting: Physical Therapy

## 2022-02-27 ENCOUNTER — Ambulatory Visit: Payer: BC Managed Care – PPO | Admitting: Physical Therapy

## 2022-02-27 DIAGNOSIS — R2681 Unsteadiness on feet: Secondary | ICD-10-CM

## 2022-02-27 DIAGNOSIS — M6281 Muscle weakness (generalized): Secondary | ICD-10-CM

## 2022-02-27 DIAGNOSIS — M79605 Pain in left leg: Secondary | ICD-10-CM

## 2022-02-27 DIAGNOSIS — R2689 Other abnormalities of gait and mobility: Secondary | ICD-10-CM

## 2022-03-01 ENCOUNTER — Ambulatory Visit (HOSPITAL_COMMUNITY)
Admission: RE | Admit: 2022-03-01 | Discharge: 2022-03-01 | Disposition: A | Discharge status: Home / Self Care | Payer: BC Managed Care – PPO | Source: Ambulatory Visit | Attending: Neurology | Admitting: Neurology

## 2022-03-01 DIAGNOSIS — R269 Unspecified abnormalities of gait and mobility: Secondary | ICD-10-CM | POA: Diagnosis not present

## 2022-03-01 DIAGNOSIS — G6289 Other specified polyneuropathies: Secondary | ICD-10-CM | POA: Insufficient documentation

## 2022-03-01 DIAGNOSIS — M5442 Lumbago with sciatica, left side: Secondary | ICD-10-CM

## 2022-03-01 DIAGNOSIS — G8929 Other chronic pain: Secondary | ICD-10-CM | POA: Diagnosis present

## 2022-03-01 NOTE — Progress Notes (Signed)
Carotid duplex bilateral study completed.   Please see CV Proc for preliminary results.   Lyanne Kates, RDMS, RVT  

## 2022-03-06 ENCOUNTER — Ambulatory Visit
Admission: RE | Admit: 2022-03-06 | Discharge: 2022-03-06 | Disposition: A | Discharge status: Home / Self Care | Payer: BC Managed Care – PPO | Source: Ambulatory Visit | Attending: Neurology | Admitting: Neurology

## 2022-03-06 DIAGNOSIS — G8929 Other chronic pain: Secondary | ICD-10-CM

## 2022-03-06 DIAGNOSIS — G6289 Other specified polyneuropathies: Secondary | ICD-10-CM

## 2022-03-06 DIAGNOSIS — R269 Unspecified abnormalities of gait and mobility: Secondary | ICD-10-CM

## 2022-03-06 NOTE — Therapy (Addendum)
OUTPATIENT PHYSICAL THERAPY TREATMENT NOTE   Patient Name: Tim Spencer MRN: [id number] DOB:[birth year], 61 y.o., male Today's Date: 03/07/2022  PCP: Meda Klinefelter, MD REFERRING PROVIDER: Marcial Pacas, MD   END OF SESSION:   PT End of Session - 03/07/22 1533     Visit Number 4    Number of Visits 13    Date for PT Re-Evaluation 03/22/22    Authorization Type BCBS; 20 VL/0 used at eval    Authorization - Visit Number 4    Authorization - Number of Visits 20    PT Start Time 3818    PT Stop Time 1528    PT Time Calculation (min) 42 min    Activity Tolerance Patient tolerated treatment well;Other (comment)   dizziness/lightheadedness   Behavior During Therapy WFL for tasks assessed/performed               Past Medical History:  Diagnosis Date   Anemia    CKD (chronic kidney disease) stage 3, GFR 30-59 ml/min (HCC)    Hypertension    Idiopathic progressive neuropathy    Secondary hyperparathyroidism, renal ([place])    Type 2 diabetes mellitus ([place])    Past Surgical History:  Procedure Laterality Date   GIVENS CAPSULE STUDY N/A 09/01/2020   Procedure: GIVENS CAPSULE STUDY;  Surgeon: Juanita Craver, MD;  Location: [facility] ENDOSCOPY;  Service: Endoscopy;  Laterality: N/A;   Patient Active Problem List   Diagnosis Date Noted   Gait abnormality 01/31/2022   Peripheral neuropathy 01/31/2022   Chronic left-sided low back pain with left-sided sciatica 01/31/2022   Abnormal stress test 08/27/2021   Heart failure ([place]) 08/14/2021   Supine hypertension 08/14/2021   Angina pectoris ([place]) 08/14/2021   Acute heart failure with preserved ejection fraction (HFpEF) (HCC)    Elevated troponin    Left carotid bruit    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)    NSTEMI (non-ST elevated myocardial infarction) ([place]) 07/21/2021   AKI (acute kidney injury) ([place])    Bilateral lower extremity edema    Anemia    Hypertensive urgency 06/28/2021   Chronic kidney  disease, stage 3a ([place])    Insulin-requiring or dependent type II diabetes mellitus ([place])    Hypothyroidism    Hyponatremia 07/05/2020   Orthostatic hypotension 06/06/2020   Bruit of right carotid artery 06/06/2020    REFERRING DIAG: R26.9 (ICD-10-CM) - Gait abnormality G62.89 (ICD-10-CM) - Other polyneuropathy M54.42,G89.29 (ICD-10-CM) - Chronic left-sided low back pain with left-sided sciatica   THERAPY DIAG:  Unsteadiness on feet  Muscle weakness (generalized)  Other abnormalities of gait and mobility  Pain in both lower extremities  Rationale for Evaluation and Treatment Rehabilitation  PERTINENT HISTORY:  PMH:  DM, orthostatic hypotension, retinopathyhypertension, CKD 3, idiopathic progressive neuropathy, orthostatic hypotension/ supine hypertension, hyponatremia.   PRECAUTIONS: Fall and Other: orthostatic hypotension  SUBJECTIVE: Has been having trouble with N/T in fingers. Mds unsure if it is diabetes or carpal tunnel related.   PAIN:  Are you having pain? Yes: NPRS scale: 8-9/10 Pain location: L foot Pain description: tingling Aggravating factors: unsure-trying medication Relieving factors: unsure   OBJECTIVE:      TODAY'S TREATMENT: 03/07/22 Activity Comments  Nustep L4 x 6 min (Ues/Les)   sitting pelvic tilt 10x Improved form  STS without UEs 8x Cues to scoot to edge of seat, reach forward with hands; pt reports c/o dizziness d/t orthostasis; frequent rest breaks required   open book stretch 10x each  Moving to tolerance  prone on elbows 10x3"   Runner's gastroc stretch 30" Res break required d/t lightheadedness     PATIENT EDUCATION: Education details: provided edu and answered questions on wrist splint; update to HEP Person educated: Patient and Spouse Education method: Explanation Education comprehension: verbalized understanding and returned demonstration Access Code: [email or web address] URL: [email or web address] Date: 03/07/2022 Prepared  by: [facility]  Exercises - Seated Hamstring Stretch  - 2 x daily - 7 x weekly - 1 sets - 3 reps - 15-30 sec hold - Hooklying Single Knee to Chest  - 1-2 x daily - 7 x weekly - 1 sets - 3 reps - 15-30 sec hold - Supine Lower Trunk Rotation  - 1-2 x daily - 7 x weekly - 1 sets - 5 reps - 15 sec hold - Supine Figure 4 Piriformis Stretch  - 1-2 x daily - 7 x weekly - 1 sets - 3 reps - 15 sec hold - Supine Piriformis Stretch with Foot on Ground  - 1 x daily - 5 x weekly - 2 sets - 30 sec hold - Seated Pelvic Tilt  - 1 x daily - 5 x weekly - 2 sets - 10 reps - Standing Gastroc Stretch at Counter  - 1 x daily - 5 x weekly - 2 sets - 30 sec hold - Sit to Stand Without Arm Support  - 1 x daily - 5 x weekly - 2 sets - 5-10 reps   GOALS: Goals reviewed with patient? Yes   SHORT TERM GOALS: Target date: 03/08/2022   Pt will be independent with HEP for improved balance, gait, strength. Baseline: Goal status: MET 03/07/22   2.  Pt will improve 5x sit<>stand to less than or equal to 30 sec to demonstrate improved functional strength and transfer efficiency.   Baseline: 35.75 Goal status: IN PROGRESS   LONG TERM GOALS: Target date: 03/22/2022   Pt will be independent with HEP for improved balance, gait, strength. Baseline:  Goal status: IN PROGRESS   2.  Pt will improve 5x sit<>stand to less than or equal to 25 sec to demonstrate improved functional strength and transfer efficiency.   Baseline:  Goal status: IN PROGRESS   3.  Pt will perform 30 seconds standing eyes closed, for improved balance. Baseline:  Goal status: IN PROGRESS   4.  Berg Balance score to be assessed, with score to improve by at least 5 points. Baseline: 23/56 02/22/2022 Goal status: IN PROGRESS   5.  Pt will report decrease in pain by at least 50% for improved overall functional mobility, sleep. Baseline: pain 8-10/10 posterior R thigh and buttocks Goal status: IN PROGRESS    ASSESSMENT:   CLINICAL IMPRESSION: Patient arrived to session with report of unchanged pain levels. Patient performed STS transfers with some limitation d/t c/o dizziness/lightheadedness from hx of orthostasis at baseline. However, with intermittent sit breaks, able to perform transfers without UE support and good effort to control descent. Worked on thoracolumbar mobility exercises with no c/o increased pain. Also initiated gastroc stretching for potential relief of foot pain. Patient required sit break after short period of standing stretching, which resolved with rest. No complaints at end of session.      OBJECTIVE IMPAIRMENTS Abnormal gait, decreased balance, decreased mobility, difficulty walking, decreased ROM, decreased strength, impaired flexibility, postural dysfunction, and pain.    ACTIVITY LIMITATIONS/PARTICIPATION LIMITATIONS: community activity, driving, and locomotion, transfers, standing for ADLs .  PERSONAL FACTORS PMH:  DM, orthostatic hypotension, retinopathyhypertension, CKD 3, idiopathic progressive neuropathy, orthostatic hypotension/ supine hypertension, hyponatremia.  are also affecting patient's functional outcome.      REHAB POTENTIAL: Good   CLINICAL DECISION MAKING: Evolving/moderate complexity   EVALUATION COMPLEXITY: Moderate   PLAN: PT FREQUENCY: 2x/week   PT DURATION: 6 weeks   PLANNED INTERVENTIONS: Therapeutic exercises, Therapeutic activity, Neuromuscular re-education, Balance training, Gait training, Patient/Family education, Joint mobilization, DME instructions, and Manual therapy   PLAN FOR NEXT SESSION: lower extremity stretches, LE strengthening, balance   Janene Harvey, PT, DPT 03/07/22 3:38 PM   Outpatient Rehab at [facility] [address] [place], [place] [address] Phone # [phone] Fax # [phone]   PHYSICAL THERAPY DISCHARGE SUMMARY  Visits from Start of Care: 4  Current  functional level related to goals / functional outcomes: Unable to assess; PT was held by Neurologist to allow for Neurosurgery evaluation    Remaining deficits: Unable to assess   Education / Equipment: HEP  Plan: Patient agrees to discharge.  Patient goals were not met. Patient is being discharged due to need for further medical/surgical eval.    Janene Harvey, PT, DPT 05/14/22 11:56 AM  [facility] at [facility] [place], [place], [place] [address] Phone # [phone] Fax # [phone]

## 2022-03-07 ENCOUNTER — Telehealth (INDEPENDENT_AMBULATORY_CARE_PROVIDER_SITE_OTHER): Payer: BC Managed Care – PPO | Admitting: Neurology

## 2022-03-07 ENCOUNTER — Telehealth: Payer: Self-pay | Admitting: Neurology

## 2022-03-07 ENCOUNTER — Encounter: Payer: Self-pay | Admitting: Physical Therapy

## 2022-03-07 ENCOUNTER — Ambulatory Visit: Payer: BC Managed Care – PPO | Admitting: Physical Therapy

## 2022-03-07 DIAGNOSIS — M6281 Muscle weakness (generalized): Secondary | ICD-10-CM

## 2022-03-07 DIAGNOSIS — G6289 Other specified polyneuropathies: Secondary | ICD-10-CM | POA: Diagnosis not present

## 2022-03-07 DIAGNOSIS — M48061 Spinal stenosis, lumbar region without neurogenic claudication: Secondary | ICD-10-CM

## 2022-03-07 DIAGNOSIS — M79604 Pain in right leg: Secondary | ICD-10-CM

## 2022-03-07 DIAGNOSIS — R269 Unspecified abnormalities of gait and mobility: Secondary | ICD-10-CM

## 2022-03-07 DIAGNOSIS — R2689 Other abnormalities of gait and mobility: Secondary | ICD-10-CM

## 2022-03-07 DIAGNOSIS — R2681 Unsteadiness on feet: Secondary | ICD-10-CM | POA: Diagnosis not present

## 2022-03-07 NOTE — Progress Notes (Signed)
ASSESSMENT AND PLAN  Tim Spencer is a 61 y.o. male   Long history of diabetes, with complications, including diabetic peripheral neuropathy, retinopathy, Slow worsening low back pain, bilateral lower extremity paresthesia, gait abnormality, Worsening bilateral hands paresthesia, Neuropathic pain,   MRI of the lumbar spine shows severe L4-5 stenosis, which could explain his progressive worsening bilateral lower extremity distal weakness, sensory loss, gait abnormality,  MRI of cervical spine only showed mild degenerative changes, his intermittent but lateral upper extremity paresthesia most likely due to carpal tunnel syndromes  Referred him to neurosurgeon for evaluation, decompression of L4-5 stenosis.      DIAGNOSTIC DATA (LABS, IMAGING, TESTING) - I reviewed patient records, labs, notes, testing and imaging myself where available.  Laboratory evaluations in February 2023, elevated TSH 6.720, normal free T4, triglyceride was elevated 299, LDL was 94 glucose was 227, creatinine was 3, A1c was 7.0, hemoglobin was 12.2,  MEDICAL HISTORY:  Tim Spencer, is a 61 year old male, seen in request by his primary care physician Dr. Ayesha Rumpf, [place], companied by his wife for evaluation of worsening bilateral lower extremity paresthesia, unsteady gait, fall,  I reviewed and summarized the referring note. PMHX. HLD HTN DM 21 years CKD  Patient has diabetes for more than 20 years, around 2010, he began to notice bilateral feet numbness tingling, gradually getting worse over the past many years, now extending to knee level, also developed variable degree are sharp pain involving his lower extremity, needle prick sensation, burning, difficulty bearing weight,  He began to use cane few years back, now reliant on walker for longer distance since 2020  He also reported a history of lumbar minimally invasive procedure for low back pain left lumbar radicular pain in the past, now presenting with  worsening lower extremity pain, radiating pain to left hip,  He also diagnosed with orthostatic hypotension, is on 3 agents for blood pressure control, this including Coreg 25 mg twice a day, clonidine 0.2 mg half tablets every day, isosorbide/hydrochlorothiazide 1 tablet 3 times a day,  He complains of declining functional status over the past few years, rely on his cane and walker for longer distance, transient sharp pain of bilateral lower extremity, despite uptitrating dose of gabapentin up to 300 mg 3 times a day, not a good candidate for higher dose due to chronic kidney disease,  His neuropathy symptoms continue to progress, now began to involving bilateral upper extremities since 2023, he denies bowel and bladder incontinence, is able to transfer himself in and out of wheelchair   Virtual Visit via video UPDATE March 07 2022  I discussed the limitations of evaluation and management by telemedicine and the availability of in person appointments. The patient expressed understanding and agreed to proceed  Location: Provider: [place] office; Patient: Home with his wife.  I connected with [place]  on March 07 2022 by a video enabled telemedicine application and verified that I am speaking with the correct person using two identifiers.  Talked with patient and his wife about MRI findings, reviewed MRI through shared screening, mild degenerative changes of cervical spine, no evidence of spinal cord compression, variable degree of mild lower cervical foraminal stenosis.  Severe spinal stenosis at L4-5,  Patient reported a history of L4-5 disc herniation laser procedure many years ago,  He also complains of intermittent bilateral hands paresthesia mainly happen at nighttime, not during the day   Laboratory evaluation in May 2023 showed well-controlled diabetes A1c 6.7, negative RPR, HIV, B12, CPK, slight elevation  of ESR 40, negative ANA, protein electrophoresis, vitamin D level, C-reactive  protein,  His profound bilateral distal lower extremity sensory loss and weakness will certainly not be explained by his well-controlled diabetes.  Observations/Objective:  Awake, alert, oriented by history taking and casual conversation, facial symmetric, no dysarthria, no aphasia   REVIEW OF SYSTEMS:  Full 14 system review of systems performed and notable only for as above All other review of systems were negative.   ALLERGIES: No Known Allergies  HOME MEDICATIONS: Current Outpatient Medications  Medication Sig Dispense Refill   ALPRAZolam (XANAX) 0.5 MG tablet Take 0.5 mg by mouth 3 (three) times daily.     aspirin EC 81 MG EC tablet Take 1 tablet (81 mg total) by mouth daily. Swallow whole. 30 tablet 1   atorvastatin (LIPITOR) 20 MG tablet Take 20 mg by mouth daily after lunch.      carvedilol (COREG) 25 MG tablet Take 1 tablet (25 mg total) by mouth 2 (two) times daily with a meal. 180 tablet 3   cloNIDine (CATAPRES) 0.2 MG tablet Take 1 tablet (0.2 mg total) by mouth every evening. (Patient taking differently: Take 0.1 mg by mouth every evening.) 60 tablet 1   Continuous Blood Gluc Sensor (FREESTYLE LIBRE 2 SENSOR) MISC Inject into the skin every 14 (fourteen) days.     diltiazem (CARDIZEM SR) 90 MG 12 hr capsule Take 1 capsule (90 mg total) by mouth 2 (two) times daily. 180 capsule 3   DULoxetine (CYMBALTA) 20 MG capsule One tab qam x one month, then 2 tabs for one month, then 3 tabs qam 90 capsule 11   EUTHYROX 137 MCG tablet Take 137 mcg by mouth daily.     ferrous sulfate 325 (65 FE) MG tablet Take 650 mg by mouth daily after supper.      fluticasone (FLONASE) 50 MCG/ACT nasal spray Place 2 sprays into both nostrils daily.     gabapentin (NEURONTIN) 300 MG capsule Take 300 mg by mouth 3 (three) times daily.     HUMALOG KWIKPEN 100 UNIT/ML KwikPen Inject 10-15 Units into the skin 3 (three) times daily before meals. Sliding Scale Insulin     Iron-FA-B Cmp-C-Biot-Probiotic  (FUSION PLUS) CAPS Take 1 capsule by mouth daily.     isosorbide-hydrALAZINE (BIDIL) 20-37.5 MG tablet Take 1 tablet by mouth 3 (three) times daily. 30 tablet 1   magnesium oxide (MAG-OX) 400 MG tablet Take 400 mg by mouth daily after lunch.     Multiple Vitamin (MULTIVITAMIN WITH MINERALS) TABS tablet Take 1 tablet by mouth daily after lunch.     nitroGLYCERIN (NITROSTAT) 0.4 MG SL tablet Place 1 tablet (0.4 mg total) under the tongue every 5 (five) minutes x 3 doses as needed for chest pain. 30 tablet 12   Omega-3 1000 MG CAPS Take 2,000 mg by mouth at bedtime.      ondansetron (ZOFRAN-ODT) 4 MG disintegrating tablet Take 4 mg by mouth 3 (three) times daily.     torsemide (DEMADEX) 20 MG tablet Take 1 tablet (20 mg total) by mouth 2 (two) times daily. 60 tablet 1   TOUJEO SOLOSTAR 300 UNIT/ML Solostar Pen Inject 30 Units into the skin daily. 30     vitamin B-12 (CYANOCOBALAMIN) 1000 MCG tablet Take 1,000 mcg by mouth daily.     No current facility-administered medications for this visit.    PAST MEDICAL HISTORY: Past Medical History:  Diagnosis Date   Anemia    CKD (chronic kidney disease) stage 3,  GFR 30-59 ml/min (HCC)    Hypertension    Idiopathic progressive neuropathy    Secondary hyperparathyroidism, renal (HCC)    Type 2 diabetes mellitus ([place])     PAST SURGICAL HISTORY: Past Surgical History:  Procedure Laterality Date   GIVENS CAPSULE STUDY N/A 09/01/2020   Procedure: GIVENS CAPSULE STUDY;  Surgeon: Juanita Craver, MD;  Location: [facility] ENDOSCOPY;  Service: Endoscopy;  Laterality: N/A;    FAMILY HISTORY: Family History  Problem Relation Age of Onset   Stroke Mother    Hypertension Mother    Diabetes Father     SOCIAL HISTORY: Social History   Socioeconomic History   Marital status: Married    Spouse name: Not on file   Number of children: 0   Years of education: Not on file   Highest education level: Not on file  Occupational History   Not on file  Tobacco Use    Smoking status: Never   Smokeless tobacco: Never  Vaping Use   Vaping Use: Never used  Substance and Sexual Activity   Alcohol use: Never   Drug use: Never   Sexual activity: Not on file  Other Topics Concern   Not on file  Social History Narrative   Not on file   Social Determinants of Health   Financial Resource Strain: Not on file  Food Insecurity: Not on file  Transportation Needs: Not on file  Physical Activity: Not on file  Stress: Not on file  Social Connections: Not on file  Intimate Partner Violence: Not on file    Total time spent reviewing the chart, obtaining history, examined patient, ordering tests, documentation, consultations and family, care coordination was 68 mins  Marcial Pacas, M.D. Ph.D.  [facility] [address], [place], [place] [address] Ph: [phone] Fax: [phone]  CC:  Lin Landsman, MD [place],  [place] [id number]  Lin Landsman, MD

## 2022-03-07 NOTE — Telephone Encounter (Signed)
Please call patient, MRI of the cervical spine showed mild degenerative changes no evidence of spinal cord or nerve root compression  MRI of lumbar spine showed severe spinal stenosis at L4-5,  Would want to proceed with surgical decompression for his lumbar stenosis  If he has any questions please set up a virtual visit with me today  IMPRESSION: 1. Mild degenerative changes of the cervical spine with moderate left neural foraminal narrowing at C6-7. No high-grade cervical spinal canal stenosis. 2. Degenerative changes at L4-5 resulting in severe spinal canal stenosis, mild right and moderate left neural foraminal narrowing. 3. At L5-S1, narrowing of the bilateral subarticular zones, left greater than right, and severe left neural foraminal narrowing.

## 2022-03-07 NOTE — Telephone Encounter (Signed)
I called pt's wife and pt and we discussed results.  He verbalized understanding to the plan/recommendation but would like to have virtual visit to discuss further. Pt would like to be called at this number prior to visit starting.  [phone]

## 2022-03-08 ENCOUNTER — Telehealth: Payer: Self-pay | Admitting: Neurology

## 2022-03-08 NOTE — Telephone Encounter (Signed)
Pt would like a call from the nurse to discuss if need to continue PT.

## 2022-03-08 NOTE — Telephone Encounter (Signed)
Verbally discussed with Dr. Krista Blue. Recommended to hold PT until neurosurgery evaluation. VM left advising of this

## 2022-03-12 ENCOUNTER — Ambulatory Visit: Payer: BC Managed Care – PPO | Admitting: Neurology

## 2022-03-12 ENCOUNTER — Telehealth: Payer: Self-pay | Admitting: Neurology

## 2022-03-12 ENCOUNTER — Ambulatory Visit: Payer: BC Managed Care – PPO | Admitting: Physical Therapy

## 2022-03-12 NOTE — Telephone Encounter (Signed)
Referral for Neurosurgery sent to [facility] [phone]. 

## 2022-03-13 ENCOUNTER — Ambulatory Visit: Payer: BC Managed Care – PPO | Admitting: Physical Therapy

## 2022-03-15 ENCOUNTER — Ambulatory Visit: Payer: BC Managed Care – PPO | Admitting: Physical Therapy

## 2022-03-19 ENCOUNTER — Ambulatory Visit: Payer: BC Managed Care – PPO | Admitting: Physical Therapy

## 2022-03-20 ENCOUNTER — Encounter: Payer: Self-pay | Admitting: Neurology

## 2022-03-20 NOTE — Telephone Encounter (Signed)
Error

## 2022-03-22 NOTE — Telephone Encounter (Signed)
Pt states his insurance will only cover him to go to [facility] in [employer]. That office # is [phone], Pt is asking for a call to discuss

## 2022-04-02 ENCOUNTER — Ambulatory Visit: Payer: BC Managed Care – PPO | Admitting: Neurology

## 2022-04-02 NOTE — Telephone Encounter (Signed)
I called patient, informed him I would find him somewhere to go for Neurosurgery.

## 2022-04-16 ENCOUNTER — Ambulatory Visit
Admission: RE | Admit: 2022-04-16 | Discharge: 2022-04-16 | Disposition: A | Discharge status: Home / Self Care | Payer: BC Managed Care – PPO | Source: Ambulatory Visit | Attending: Family Medicine | Admitting: Family Medicine

## 2022-04-16 ENCOUNTER — Other Ambulatory Visit: Payer: Self-pay | Admitting: Family Medicine

## 2022-04-16 DIAGNOSIS — R52 Pain, unspecified: Secondary | ICD-10-CM

## 2022-04-17 ENCOUNTER — Telehealth: Payer: Self-pay | Admitting: Neurology

## 2022-04-17 NOTE — Telephone Encounter (Signed)
Pt wants a nurse to call him because the coding is wrong on his bill tried several times to connect to billing team but he  said he wants to talk to a nurse.

## 2022-04-17 NOTE — Telephone Encounter (Signed)
I called patient. He reports that he was charged $232 for his vitamin D lab in May 2023. He reports that [facility] told him that we need to provide an updated diagnosis code to get this covered by his insurance. I advised him that we usually receive a faxed request from [facility] regarding this but it is unclear if we have received this yet.  I will ask Dr. Krista Blue if she can add additional dx codes to try and get vitamin D covered. We can provide this to [facility].

## 2022-04-18 NOTE — Telephone Encounter (Signed)
I spoke to Tim Spencer at [facility] [phone]). Provided updated ICD10 codes. They will re-bill his insurance plan (this will take 45-60 days for processing). He may disregard the current bill.   I called the patient and also provided this update.

## 2022-05-07 ENCOUNTER — Ambulatory Visit: Payer: BC Managed Care – PPO | Admitting: Neurology

## 2022-06-01 DIAGNOSIS — E785 Hyperlipidemia, unspecified: Secondary | ICD-10-CM | POA: Insufficient documentation

## 2022-06-05 DIAGNOSIS — M5116 Intervertebral disc disorders with radiculopathy, lumbar region: Secondary | ICD-10-CM | POA: Insufficient documentation

## 2022-06-05 HISTORY — PX: SPINE SURGERY: SHX786

## 2022-06-14 ENCOUNTER — Telehealth: Payer: Self-pay | Admitting: Neurology

## 2022-06-14 NOTE — Telephone Encounter (Signed)
On 06/05/2022 underwent bilateral L4-L5 interlaminar decompression with discectomy with Dr. Retia Passe.  He has follow-up 06/11/2022, walking with a walker, doing well return to follow-up as needed.

## 2022-06-27 ENCOUNTER — Telehealth: Payer: Self-pay | Admitting: Neurology

## 2022-06-27 NOTE — Telephone Encounter (Signed)
Pt called stating that his insurance will not cover him taking his DULoxetine (CYMBALTA) 20 MG capsule 3 times a day so his pharmacy is needing a new Rx stating for him to take 2 times a day. Please advise.

## 2022-06-28 MED ORDER — DULOXETINE HCL 20 MG PO CPEP
20.0000 mg | ORAL_CAPSULE | Freq: Two times a day (BID) | ORAL | 11 refills | Status: DC
Start: 2022-06-28 — End: 2022-07-16

## 2022-06-28 NOTE — Telephone Encounter (Signed)
I spoke with the pharmacist, Katrina. She confirmed the insurance company will not pay for duloxetine 20 mg TID. They will allow duloxetine 20 mg BID.

## 2022-06-28 NOTE — Telephone Encounter (Signed)
Meds ordered this encounter  Medications   DULoxetine (CYMBALTA) 20 MG capsule    Sig: Take 1 capsule (20 mg total) by mouth 2 (two) times daily. One tab qam x one month, then 2 tabs qam    Dispense:  60 capsule    Refill:  11

## 2022-07-09 ENCOUNTER — Ambulatory Visit: Payer: BC Managed Care – PPO | Admitting: Cardiology

## 2022-07-16 ENCOUNTER — Encounter: Payer: Self-pay | Admitting: Neurology

## 2022-07-16 ENCOUNTER — Telehealth: Payer: Self-pay | Admitting: Neurology

## 2022-07-16 ENCOUNTER — Ambulatory Visit (INDEPENDENT_AMBULATORY_CARE_PROVIDER_SITE_OTHER): Payer: BC Managed Care – PPO | Admitting: Neurology

## 2022-07-16 VITALS — BP 174/83 | HR 63 | Ht 67.0 in | Wt 175.0 lb

## 2022-07-16 DIAGNOSIS — R269 Unspecified abnormalities of gait and mobility: Secondary | ICD-10-CM

## 2022-07-16 DIAGNOSIS — M48061 Spinal stenosis, lumbar region without neurogenic claudication: Secondary | ICD-10-CM

## 2022-07-16 DIAGNOSIS — G6289 Other specified polyneuropathies: Secondary | ICD-10-CM | POA: Diagnosis not present

## 2022-07-16 MED ORDER — DULOXETINE HCL 20 MG PO CPEP: ORAL_CAPSULE | ORAL | 11 refills | Status: AC

## 2022-07-16 NOTE — Telephone Encounter (Signed)
Referral sent to [facility], phone # [phone]. 

## 2022-07-16 NOTE — Progress Notes (Signed)
ASSESSMENT AND PLAN  Tim Spencer is a 61 y.o. male    Severe lumbar stenosis at L4-5, Bilateral hands paresthesia Gait abnormalities  Status post decompression surgery on June 05, 2022 recovered well,  Still have residual right low back pain, radiating pain to right lower extremity, bilateral lower extremity neuropathic pain, responding well to Cymbalta, but has GI side effect, will slow titrating 20 mg / 40 mg daily  EMG nerve conduction study, for evaluation of superimposed peripheral neuropathy, bilateral upper extremity focal neuropathy  Referred to physical therapy  Bilateral AFO    DIAGNOSTIC DATA (LABS, IMAGING, TESTING) - I reviewed patient records, labs, notes, testing and imaging myself where available.  Laboratory evaluations in February 2023, elevated TSH 6.720, normal free T4, triglyceride was elevated 299, LDL was 94 glucose was 227, creatinine was 3, A1c was 7.0, hemoglobin was 12.2,  MEDICAL HISTORY:  Tim Spencer, is a 61 year old male, seen in request by his primary care physician Dr. Ayesha Rumpf, [place], companied by his wife for evaluation of worsening bilateral lower extremity paresthesia, unsteady gait, fall,  I reviewed and summarized the referring note. PMHX. HLD HTN DM 21 years CKD  Patient has diabetes for more than 20 years, around 2010, he began to notice bilateral feet numbness tingling, gradually getting worse over the past many years, now extending to knee level, also developed variable degree are sharp pain involving his lower extremity, needle prick sensation, burning, difficulty bearing weight,  He began to use cane few years back, now reliant on walker for longer distance since 2020  He also reported a history of lumbar minimally invasive procedure for low back pain left lumbar radicular pain in the past, now presenting with worsening lower extremity pain, radiating pain to left hip,  He also diagnosed with orthostatic hypotension, is on 3  agents for blood pressure control, this including Coreg 25 mg twice a day, clonidine 0.2 mg half tablets every day, isosorbide/hydrochlorothiazide 1 tablet 3 times a day,  He complains of declining functional status over the past few years, rely on his cane and walker for longer distance, transient sharp pain of bilateral lower extremity, despite uptitrating dose of gabapentin up to 300 mg 3 times a day, not a good candidate for higher dose due to chronic kidney disease,  His neuropathy symptoms continue to progress, now began to involving bilateral upper extremities since 2023, he denies bowel and bladder incontinence, is able to transfer himself in and out of wheelchair   Virtual Visit via video UPDATE March 07 2022  Talked with patient and his wife about MRI findings, reviewed MRI through shared screening, mild degenerative changes of cervical spine, no evidence of spinal cord compression, variable degree of mild lower cervical foraminal stenosis.  Severe spinal stenosis at L4-5,  Patient reported a history of L4-5 disc herniation laser procedure many years ago,  He also complains of intermittent bilateral hands paresthesia mainly happen at nighttime, not during the day   Laboratory evaluation in May 2023 showed well-controlled diabetes A1c 6.7, negative RPR, HIV, B12, CPK, slight elevation of ESR 40, negative ANA, protein electrophoresis, vitamin D level, C-reactive protein,  His profound bilateral distal lower extremity sensory loss and weakness will certainly not be explained by his well-controlled diabetes.  UPDATE Jul 16 2022: He had lumbar decompression surgery at [facility] on Sept 12 2023, he recovered well, but he still has low back pain, radiating pain to right leg, bilateral foot and shin pain, ankle swelling, gait abnormality, neuropathic pain  PHYSICAL EXAMNIATION: Vitals:   07/16/22 1147  Weight: 175 lb (79.4 kg)  Height: '5\' 7"'  (1.702 m)     Gen: NAD, conversant, well  nourised, well groomed                     Cardiovascular: Regular rate rhythm, no peripheral edema, warm, nontender. Eyes: Conjunctivae clear without exudates or hemorrhage Neck: Supple, no carotid bruits. Pulmonary: Clear to auscultation bilaterally   NEUROLOGICAL EXAM:  MENTAL STATUS: Speech/cognition: Awake, alert oriented to history taking and casual conversation  CRANIAL NERVES: CN II: Visual fields are full to confrontation.  Pupils are round equal and briskly reactive to light. CN III, IV, VI: extraocular movement are normal. No ptosis. CN V: Facial sensation is intact to pinprick in all 3 divisions bilaterally. Corneal responses are intact.  CN VII: Face is symmetric with normal eye closure and smile. CN VIII: Hearing is normal to casual conversation CN IX, X: Palate elevates symmetrically. Phonation is normal. CN XI: Head turning and shoulder shrug are intact   MOTOR: Bilateral upper extremity proximal and distal motor strength is normal, atrophy of left distal leg, bilateral lower extremity proximal muscle strength is normal, moderate left more than right ankle dorsiflexion weakness, mild plantarflexion weakness  REFLEXES: Reflexes are 1 and symmetric at the biceps, triceps, knees, and absent at ankles. Plantar responses are flexor.  SENSORY: Length-dependent decreased light touch pinprick to mid shin level  COORDINATION: Rapid alternating movements and fine finger movements are intact. There is no dysmetria on finger-to-nose and heel-knee-shin.    GAIT/STANCE: Need push-up to get up from seated position, bilateral foot drop, left worse than right, could not stand up on heels, tiptoe.    REVIEW OF SYSTEMS:  Full 14 system review of systems performed and notable only for as above All other review of systems were negative.   ALLERGIES: Allergies  Allergen Reactions   Pollen Extract     sneezing    HOME MEDICATIONS: Current Outpatient Medications  Medication  Sig Dispense Refill   ALPRAZolam (XANAX) 0.5 MG tablet Take 0.5 mg by mouth 3 (three) times daily.     aspirin EC 81 MG EC tablet Take 1 tablet (81 mg total) by mouth daily. Swallow whole. 30 tablet 1   atorvastatin (LIPITOR) 20 MG tablet Take 20 mg by mouth daily after lunch.      carvedilol (COREG) 25 MG tablet Take 1 tablet (25 mg total) by mouth 2 (two) times daily with a meal. 180 tablet 3   cloNIDine (CATAPRES) 0.2 MG tablet Take 1 tablet (0.2 mg total) by mouth every evening. (Patient taking differently: Take 0.1 mg by mouth every evening.) 60 tablet 1   Continuous Blood Gluc Sensor (FREESTYLE LIBRE 2 SENSOR) MISC Inject into the skin every 14 (fourteen) days.     diltiazem (CARDIZEM SR) 90 MG 12 hr capsule Take 1 capsule (90 mg total) by mouth 2 (two) times daily. 180 capsule 3   DULoxetine (CYMBALTA) 20 MG capsule Take 1 capsule (20 mg total) by mouth 2 (two) times daily. One tab qam x one month, then 2 tabs qam 60 capsule 11   EUTHYROX 137 MCG tablet Take 137 mcg by mouth daily.     ferrous sulfate 325 (65 FE) MG tablet Take 650 mg by mouth daily after supper.      fluticasone (FLONASE) 50 MCG/ACT nasal spray Place 2 sprays into both nostrils daily.     gabapentin (NEURONTIN) 300 MG capsule Take  300 mg by mouth 3 (three) times daily.     HUMALOG KWIKPEN 100 UNIT/ML KwikPen Inject 10-15 Units into the skin 3 (three) times daily before meals. Sliding Scale Insulin     Iron-FA-B Cmp-C-Biot-Probiotic (FUSION PLUS) CAPS Take 1 capsule by mouth daily.     isosorbide-hydrALAZINE (BIDIL) 20-37.5 MG tablet Take 1 tablet by mouth 3 (three) times daily. 30 tablet 1   magnesium oxide (MAG-OX) 400 MG tablet Take 400 mg by mouth daily after lunch.     Multiple Vitamin (MULTIVITAMIN WITH MINERALS) TABS tablet Take 1 tablet by mouth daily after lunch.     nitroGLYCERIN (NITROSTAT) 0.4 MG SL tablet Place 1 tablet (0.4 mg total) under the tongue every 5 (five) minutes x 3 doses as needed for chest pain. 30  tablet 12   Omega-3 1000 MG CAPS Take 2,000 mg by mouth at bedtime.      ondansetron (ZOFRAN-ODT) 4 MG disintegrating tablet Take 4 mg by mouth 3 (three) times daily.     torsemide (DEMADEX) 20 MG tablet Take 1 tablet (20 mg total) by mouth 2 (two) times daily. 60 tablet 1   TOUJEO SOLOSTAR 300 UNIT/ML Solostar Pen Inject 30 Units into the skin daily. 30     vitamin B-12 (CYANOCOBALAMIN) 1000 MCG tablet Take 1,000 mcg by mouth daily.     No current facility-administered medications for this visit.    PAST MEDICAL HISTORY: Past Medical History:  Diagnosis Date   Anemia    CKD (chronic kidney disease) stage 3, GFR 30-59 ml/min (HCC)    Hypertension    Idiopathic progressive neuropathy    Secondary hyperparathyroidism, renal ([place])    Type 2 diabetes mellitus ([place])     PAST SURGICAL HISTORY: Past Surgical History:  Procedure Laterality Date   GIVENS CAPSULE STUDY N/A 09/01/2020   Procedure: GIVENS CAPSULE STUDY;  Surgeon: Juanita Craver, MD;  Location: [facility] ENDOSCOPY;  Service: Endoscopy;  Laterality: N/A;    FAMILY HISTORY: Family History  Problem Relation Age of Onset   Stroke Mother    Hypertension Mother    Diabetes Father     SOCIAL HISTORY: Social History   Socioeconomic History   Marital status: Married    Spouse name: Not on file   Number of children: 0   Years of education: Not on file   Highest education level: Not on file  Occupational History   Not on file  Tobacco Use   Smoking status: Never   Smokeless tobacco: Never  Vaping Use   Vaping Use: Never used  Substance and Sexual Activity   Alcohol use: Never   Drug use: Never   Sexual activity: Not on file  Other Topics Concern   Not on file  Social History Narrative   Not on file   Social Determinants of Health   Financial Resource Strain: Not on file  Food Insecurity: Not on file  Transportation Needs: Not on file  Physical Activity: Not on file  Stress: Not on file  Social Connections: Not on  file  Intimate Partner Violence: Not on file      Marcial Pacas, M.D. Ph.D.  [facility] [address], [place], [place] [address] Ph: [phone] Fax: [phone]  CC:  Lin Landsman, MD [place],  [place] [address]  Lin Landsman, MD

## 2022-07-24 ENCOUNTER — Telehealth: Payer: Self-pay | Admitting: Neurology

## 2022-07-24 ENCOUNTER — Telehealth: Payer: Self-pay

## 2022-07-24 DIAGNOSIS — G8929 Other chronic pain: Secondary | ICD-10-CM

## 2022-07-24 DIAGNOSIS — G6289 Other specified polyneuropathies: Secondary | ICD-10-CM

## 2022-07-24 DIAGNOSIS — M48061 Spinal stenosis, lumbar region without neurogenic claudication: Secondary | ICD-10-CM

## 2022-07-24 DIAGNOSIS — R269 Unspecified abnormalities of gait and mobility: Secondary | ICD-10-CM

## 2022-07-24 NOTE — Telephone Encounter (Signed)
Your request for Duloxetine Cap '20mg'$  has been approved.  DULOXETINE CAP '20MG'$ , use as directed (3.00 per day), is approved for 12 months through 07/25/2023.

## 2022-07-24 NOTE — Telephone Encounter (Signed)
Referral for PT placed for Neuro Rehab.

## 2022-07-24 NOTE — Telephone Encounter (Signed)
Pt called stating that the therapy location that he was referred to does not have the equipment that he is needing and he is wanting to know if he can be referred to the office next door. Please advise.

## 2022-07-24 NOTE — Telephone Encounter (Signed)
Tim Spencer is calling. Stated she need more information om P.A for medication duloxetine.

## 2022-07-24 NOTE — Telephone Encounter (Signed)
A PA for duloxetine 20 mg capsules has been started on CMM. Key: [id number] - PA Case ID: [id number]. It is currently under review.

## 2022-07-26 ENCOUNTER — Ambulatory Visit: Payer: BC Managed Care – PPO | Attending: Neurology | Admitting: Physical Therapy

## 2022-07-26 ENCOUNTER — Encounter: Payer: Self-pay | Admitting: Physical Therapy

## 2022-07-26 DIAGNOSIS — R269 Unspecified abnormalities of gait and mobility: Secondary | ICD-10-CM | POA: Insufficient documentation

## 2022-07-26 DIAGNOSIS — M79605 Pain in left leg: Secondary | ICD-10-CM | POA: Diagnosis present

## 2022-07-26 DIAGNOSIS — R2681 Unsteadiness on feet: Secondary | ICD-10-CM | POA: Insufficient documentation

## 2022-07-26 DIAGNOSIS — R2689 Other abnormalities of gait and mobility: Secondary | ICD-10-CM | POA: Diagnosis present

## 2022-07-26 DIAGNOSIS — M79604 Pain in right leg: Secondary | ICD-10-CM | POA: Diagnosis present

## 2022-07-26 DIAGNOSIS — M6281 Muscle weakness (generalized): Secondary | ICD-10-CM | POA: Insufficient documentation

## 2022-07-26 DIAGNOSIS — G6289 Other specified polyneuropathies: Secondary | ICD-10-CM | POA: Insufficient documentation

## 2022-07-26 DIAGNOSIS — M48061 Spinal stenosis, lumbar region without neurogenic claudication: Secondary | ICD-10-CM | POA: Insufficient documentation

## 2022-07-26 NOTE — Therapy (Signed)
OUTPATIENT PHYSICAL THERAPY NEURO EVALUATION   Patient Name: Tim Spencer MRN: [id number] DOB:10/22/1960, 61 y.o., male Today's Date: 07/26/2022   PCP: Lin Landsman MD  REFERRING PROVIDER: Marcial Pacas, MD    PT End of Session - 07/26/22 1620     Visit Number 1    Number of Visits 17    Date for PT Re-Evaluation 09/20/22    Authorization Type [employer]    Authorization Time Period 07/26/22 to 09/20/22    Authorization - Number of Visits 16   has 20 visits a year, used 4 already   PT Start Time 1535    PT Stop Time 1603   unable to complete further evaluation or intervention safely due to elevated BP   PT Time Calculation (min) 28 min    Activity Tolerance Treatment limited secondary to medical complications (Comment);Patient tolerated treatment well   elevated BP   Behavior During Therapy WFL for tasks assessed/performed             Past Medical History:  Diagnosis Date   Anemia    CKD (chronic kidney disease) stage 3, GFR 30-59 ml/min (HCC)    Hypertension    Idiopathic progressive neuropathy    Secondary hyperparathyroidism, renal ([place])    Type 2 diabetes mellitus ([place])    Past Surgical History:  Procedure Laterality Date   GIVENS CAPSULE STUDY N/A 09/01/2020   Procedure: GIVENS CAPSULE STUDY;  Surgeon: Juanita Craver, MD;  Location: 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)    NSTEMI (non-ST elevated  myocardial infarction) ([place]) 07/21/2021   AKI (acute kidney injury) ([place])    Bilateral lower extremity edema    Anemia    Hypertensive urgency 06/28/2021   Chronic kidney disease, stage 3a ([place])    Insulin-requiring or dependent type II diabetes mellitus ([place])    Hypothyroidism    Hyponatremia 07/05/2020   Orthostatic hypotension 06/06/2020   Bruit of right carotid artery 06/06/2020    ONSET DATE: 07/16/2022   REFERRING DIAG: M48.061 (ICD-10-CM) - Spinal stenosis of lumbar region, unspecified whether neurogenic claudication present G62.89 (ICD-10-CM) - Other polyneuropathy R26.9 (ICD-10-CM) - Gait abnormality   THERAPY DIAG:  Unsteadiness on feet  Muscle weakness (generalized)  Pain in both lower extremities  Other abnormalities of gait and mobility  Rationale for Evaluation and Treatment: Rehabilitation  SUBJECTIVE:  SUBJECTIVE STATEMENT:  I had that back surgery September 12th, now I am following up with Dr. Krista Blue and getting PT. I have pain in both legs and have lost feeling in both legs, they did surgery because of pain in my sciatic area. I am concerned about my legs hurting still, neuropathic pain is still there my surgeon told me it will take about 3 months to heal. Hx of orthostatic BP, on 3 different medications for this, most recent was 183/90. In process of getting B AFOs, have an appointment soon for measurements. Took half a dose of BP medicine about 1430, didn't want to take the whole dose as I was worried BP would be too low in PT if I did this.   Pt accompanied by: significant other  PERTINENT HISTORY: ASSESSMENT AND PLAN   Tim Spencer is a 61 y.o. male     Severe lumbar stenosis at L4-5, Bilateral hands paresthesia Gait abnormalities             Status post decompression  surgery on June 05, 2022 recovered well,             Still have residual right low back pain, radiating pain to right lower extremity, bilateral lower extremity neuropathic pain, responding well to Cymbalta, but has GI side effect, will slow titrating 20 mg / 40 mg daily             EMG nerve conduction study, for evaluation of superimposed peripheral neuropathy, bilateral upper extremity focal neuropathy             Referred to physical therapy             Bilateral AFO     DIAGNOSTIC DATA (LABS, IMAGING, TESTING) - I reviewed patient records, labs, notes, testing and imaging myself where available.   Laboratory evaluations in February 2023, elevated TSH 6.720, normal free T4, triglyceride was elevated 299, LDL was 94 glucose was 227, creatinine was 3, A1c was 7.0, hemoglobin was 12.2,   MEDICAL HISTORY:   Tim Spencer, is a 61 year old male, seen in request by his primary care physician Dr. Ayesha Rumpf, [place], companied by his wife for evaluation of worsening bilateral lower extremity paresthesia, unsteady gait, fall,   I reviewed and summarized the referring note. PMHX. HLD HTN DM 21 years CKD   Patient has diabetes for more than 20 years, around 2010, he began to notice bilateral feet numbness tingling, gradually getting worse over the past many years, now extending to knee level, also developed variable degree are sharp pain involving his lower extremity, needle prick sensation, burning, difficulty bearing weight,  He began to use cane few years back, now reliant on walker for longer distance since 2020  He also reported a history of lumbar minimally invasive procedure for low back pain left lumbar radicular pain in the past, now presenting with worsening lower extremity pain, radiating pain to left hip,  He also diagnosed with orthostatic hypotension, is on 3 agents for blood pressure control, this including Coreg 25 mg twice a day, clonidine 0.2 mg half tablets every day,  isosorbide/hydrochlorothiazide 1 tablet 3 times a day,  He complains of declining functional status over the past few years, rely on his cane and walker for longer distance, transient sharp pain of bilateral lower extremity, despite uptitrating dose of gabapentin up to 300 mg 3 times a day, not a good candidate for higher dose due to chronic kidney disease,  His neuropathy symptoms continue to progress, now  began to involving bilateral upper extremities since 2023, he denies bowel and bladder incontinence, is able to transfer himself in and out of wheelchair PAIN:  Are you having pain? Yes: NPRS scale: 10/10 Pain location: B LEs  Pain description: neuropathic pain 10/10, sciatic pain 7/10 Aggravating factors: walking, standing  Relieving factors: some medications   PRECAUTIONS: Back and Fall, watch BPs   WEIGHT BEARING RESTRICTIONS: No  FALLS: Has patient fallen in last 6 months? No  LIVING ENVIRONMENT: Lives with: lives with their family Lives in: House/apartment Stairs: ground level but has 2 steps to go into living room; has basement and second floor of home  Has following equipment at home: Walker - 4 wheeled, [place]   PLOF: Independent, Independent with basic ADLs, Independent with gait, and Independent with transfers  PATIENT GOALS: be able to get through daily routine easier and safer, try to reduce pain if able   OBJECTIVE:   DIAGNOSTIC FINDINGS: CLINICAL DATA:  Bilateral leg pains. History of herniated disc repair   EXAM: LUMBAR SPINE - 2-3 VIEW   COMPARISON:  Lumbar spine MRI 03/06/2022   FINDINGS: No evidence of acute fracture. Mild loss of disc height at L4-L5 and mild-moderate loss of disc height at L5-S1. Moderate lumbar facet arthropathy at L4-S1. Aortic atherosclerotic calcification.   IMPRESSION: No acute fracture. Degenerative changes at L4-S1 where there is mild-moderate disc space height loss and facet arthropathy.    COGNITION: Overall cognitive  status: Within functional limits for tasks assessed   SENSATION: Hx severe polyneuropathy  , LTT OK actually able to pass screen   COORDINATION: Mild ataxia noted especially L UE with RAM motions   EDEMA:    MUSCLE TONE:   MUSCLE LENGTH:   DTRs: POSTURE:   LOWER EXTREMITY ROM:     Active  Right Eval Left Eval  Hip flexion    Hip extension    Hip abduction    Hip adduction    Hip internal rotation    Hip external rotation    Knee flexion    Knee extension    Ankle dorsiflexion    Ankle plantarflexion    Ankle inversion    Ankle eversion     (Blank rows = not tested)  LOWER EXTREMITY MMT:    MMT Right Eval Left Eval  Hip flexion    Hip extension    Hip abduction    Hip adduction    Hip internal rotation    Hip external rotation    Knee flexion 4 4-  Knee extension 4+ 4-  Ankle dorsiflexion 4 4-  Ankle plantarflexion    Ankle inversion    Ankle eversion    (Blank rows = not tested)  BED MOBILITY:  Unable to assess at eval, BP  TRANSFERS: Assistive device utilized: Environmental consultant - 4 wheeled  Sit to stand: Modified independence Stand to sit: Modified independence Chair to chair:  Floor:   RAMP:    CURB:    STAIRS: Level of Assistance:  Stair Negotiation Technique:   Number of Stairs:   Height of Stairs:   Comments: unable to assess at eval, high BP   GAIT: Gait pattern: step through pattern, decreased arm swing- Right, decreased arm swing- Left, scissoring, ataxic, trendelenburg, trunk flexed, and narrow BOS Distance walked: in clinic distances  Assistive device utilized: None Level of assistance: CGA Comments: able to walk in clinic distances with min guard but unable to maintain straight line navigation, mild ataxia and scissoring pattern; needs rollator for  stability and pain control with gait   FUNCTIONAL TESTS:  Tandem stance : 5-6 seconds MinA ataxic  5xSTS: 24 seconds BUE support on rollator  PATIENT SURVEYS:    TODAY'S TREATMENT:                                                                                                                               DATE:   Eval:   BP 181/83 HR 66 seated 170/71 HR 67 standing  182/81 HR 66 upon 3rd BP check as per pt request in sitting at EOS      PATIENT EDUCATION: Education details: limitations of evaluation today due to elevated BP, POC, back precautions  Person educated: Patient and Spouse Education method: Explanation Education comprehension: verbalized understanding and needs further education  HOME EXERCISE PROGRAM: TBD   GOALS: Goals reviewed with patient? No  SHORT TERM GOALS: Target date: 08/23/2022  Will be compliant with appropriate progressive HEP  Baseline: Goal status: INITIAL  2.  Will be able to maintain tandem stance for 15 seconds with no more than min guard B Baseline:  Goal status: INITIAL  3.  Will be able to demonstrate correct technique for skin checks/wear schedule for new AFOs after he has them  Baseline:  Goal status: INITIAL  4.  Berg to be assessed and STG/LTGs written  Baseline:  Goal status: INITIAL    LONG TERM GOALS: Target date: 09/20/2022  MMT to improve by at least 1 grade in all weak groups  Baseline:  Goal status: INITIAL  2.  Pain to be no more than 7/10 at worst  Baseline:  Goal status: INITIAL  3.  Berg LTG  Baseline:  Goal status: INITIAL  4.  Will be able to ambulate household distances with no device and no unsteadiness, and community distances with LRAD with minimal fall risk  Baseline:  Goal status: INITIAL  5.  Will be able to complete floor to stand transfers with no more than min guard assist  Baseline:  Goal status: INITIAL    ASSESSMENT:  CLINICAL IMPRESSION: Patient is a 61 y.o. M who was seen today for physical therapy evaluation and treatment for care post-op lumbar surgery, gait abnormality, and polyneuropathy. Eval was very limited today as BP was elevated to an unsafe extent,  unable to perform many functional tests and measures safely today. Of note he is getting fit for new AFOs soon, will likely require education regarding this moving forward as he adjusts to them especially in the context of severe polyneuropathy and sensation impairment. Will benefit from skilled PT services to address functional limitations, reduce fall risk and pain as able, and optimize overall level of function moving forward.  OBJECTIVE IMPAIRMENTS: Abnormal gait, decreased activity tolerance, decreased balance, decreased coordination, decreased knowledge of use of DME, difficulty walking, decreased strength, impaired sensation, and pain.   ACTIVITY LIMITATIONS: standing, squatting, stairs, transfers, and locomotion level  PARTICIPATION LIMITATIONS: driving, shopping, community activity,  and yard work  PERSONAL FACTORS: Age, Behavior pattern, Fitness, Past/current experiences, and Time since onset of injury/illness/exacerbation are also affecting patient's functional outcome.   REHAB POTENTIAL: Fair complex medical history and multiple chronic health issues contributing to complaints   CLINICAL DECISION MAKING: Evolving/moderate complexity  EVALUATION COMPLEXITY: Moderate  PLAN:  PT FREQUENCY: 2x/week  PT DURATION: 8 weeks  PLANNED INTERVENTIONS: Therapeutic exercises, Therapeutic activity, Neuromuscular re-education, Balance training, Gait training, Patient/Family education, Self Care, Joint mobilization, Stair training, Orthotic/Fit training, DME instructions, Taping, Manual therapy, and Re-evaluation  PLAN FOR NEXT SESSION: BP systolic 373 goal per pt; needs a berg and potentially 3 or 6MWT. Eval was very limited due to BP, would focus on the functional and balance/strength/fall prevention. Has poor awareness of back precautions post-op, need to keep reminding him.    Ann Lions PT DPT PN2  07/26/2022, 4:22 PM

## 2022-07-30 ENCOUNTER — Telehealth: Payer: Self-pay | Admitting: Physical Therapy

## 2022-07-30 ENCOUNTER — Encounter: Payer: Self-pay | Admitting: Physical Therapy

## 2022-07-30 ENCOUNTER — Ambulatory Visit: Payer: BC Managed Care – PPO | Admitting: Physical Therapy

## 2022-07-30 DIAGNOSIS — R2681 Unsteadiness on feet: Secondary | ICD-10-CM | POA: Diagnosis not present

## 2022-07-30 DIAGNOSIS — M6281 Muscle weakness (generalized): Secondary | ICD-10-CM

## 2022-07-30 DIAGNOSIS — R2689 Other abnormalities of gait and mobility: Secondary | ICD-10-CM

## 2022-07-30 DIAGNOSIS — M79604 Pain in right leg: Secondary | ICD-10-CM

## 2022-07-30 NOTE — Therapy (Signed)
OUTPATIENT PHYSICAL THERAPY NEURO TREATMENT   Patient Name: Tim Spencer MRN: [id number] DOB:1961/06/08, 61 y.o., male Today's Date: 07/30/2022   PCP: Lin Landsman MD  REFERRING PROVIDER: Marcial Pacas, MD    PT End of Session - 07/30/22 0806     Visit Number 2    Number of Visits 17    Date for PT Re-Evaluation 09/20/22    Authorization Type [employer]    Authorization Time Period 07/26/22 to 09/20/22    Authorization - Number of Visits 16    PT Start Time 0809   pt in bathroom   PT Stop Time 0843    PT Time Calculation (min) 34 min    Activity Tolerance Patient tolerated treatment well    Behavior During Therapy WFL for tasks assessed/performed              Past Medical History:  Diagnosis Date   Anemia    CKD (chronic kidney disease) stage 3, GFR 30-59 ml/min (HCC)    Hypertension    Idiopathic progressive neuropathy    Secondary hyperparathyroidism, renal ([place])    Type 2 diabetes mellitus ([place])    Past Surgical History:  Procedure Laterality Date   GIVENS CAPSULE STUDY N/A 09/01/2020   Procedure: GIVENS CAPSULE STUDY;  Surgeon: Juanita Craver, MD;  Location: 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)    NSTEMI (non-ST elevated myocardial infarction) ([place]) 07/21/2021   AKI (acute kidney injury) ([place])    Bilateral lower extremity edema    Anemia    Hypertensive urgency 06/28/2021   Chronic kidney  disease, stage 3a ([place])    Insulin-requiring or dependent type II diabetes mellitus ([place])    Hypothyroidism    Hyponatremia 07/05/2020   Orthostatic hypotension 06/06/2020   Bruit of right carotid artery 06/06/2020    ONSET DATE: 07/16/2022   REFERRING DIAG: M48.061 (ICD-10-CM) - Spinal stenosis of lumbar region, unspecified whether neurogenic claudication present G62.89 (ICD-10-CM) - Other polyneuropathy R26.9 (ICD-10-CM) - Gait abnormality   THERAPY DIAG:  Unsteadiness on feet  Muscle weakness (generalized)  Pain in both lower extremities  Other abnormalities of gait and mobility  Rationale for Evaluation and Treatment: Rehabilitation  SUBJECTIVE:  SUBJECTIVE STATEMENT:  I took the full dose of my BP medicine around 0645 this morning, no falls or close calls recently.    Pt accompanied by: significant other  PERTINENT HISTORY: ASSESSMENT AND PLAN   Tim Spencer is a 61 y.o. male     Severe lumbar stenosis at L4-5, Bilateral hands paresthesia Gait abnormalities             Status post decompression surgery on June 05, 2022 recovered well,             Still have residual right low back pain, radiating pain to right lower extremity, bilateral lower extremity neuropathic pain, responding well to Cymbalta, but has GI side effect, will slow titrating 20 mg / 40 mg daily             EMG nerve conduction study, for evaluation of superimposed peripheral neuropathy, bilateral upper extremity focal neuropathy             Referred to physical therapy             Bilateral AFO     DIAGNOSTIC DATA (LABS, IMAGING, TESTING) - I reviewed patient records, labs, notes, testing and imaging myself where available.   Laboratory evaluations in February 2023, elevated TSH 6.720, normal free T4,  triglyceride was elevated 299, LDL was 94 glucose was 227, creatinine was 3, A1c was 7.0, hemoglobin was 12.2,   MEDICAL HISTORY:   Tim Spencer, is a 61 year old male, seen in request by his primary care physician Dr. Ayesha Rumpf, [place], companied by his wife for evaluation of worsening bilateral lower extremity paresthesia, unsteady gait, fall,   I reviewed and summarized the referring note. PMHX. HLD HTN DM 21 years CKD   Patient has diabetes for more than 20 years, around 2010, he began to notice bilateral feet numbness tingling, gradually getting worse over the past many years, now extending to knee level, also developed variable degree are sharp pain involving his lower extremity, needle prick sensation, burning, difficulty bearing weight,  He began to use cane few years back, now reliant on walker for longer distance since 2020  He also reported a history of lumbar minimally invasive procedure for low back pain left lumbar radicular pain in the past, now presenting with worsening lower extremity pain, radiating pain to left hip,  He also diagnosed with orthostatic hypotension, is on 3 agents for blood pressure control, this including Coreg 25 mg twice a day, clonidine 0.2 mg half tablets every day, isosorbide/hydrochlorothiazide 1 tablet 3 times a day,  He complains of declining functional status over the past few years, rely on his cane and walker for longer distance, transient sharp pain of bilateral lower extremity, despite uptitrating dose of gabapentin up to 300 mg 3 times a day, not a good candidate for higher dose due to chronic kidney disease,  His neuropathy symptoms continue to progress, now began to involving bilateral upper extremities since 2023, he denies bowel and bladder incontinence, is able to transfer himself in and out of wheelchair PAIN:  Are you having pain? Yes: NPRS scale: 6-7/10 Pain location: B LEs  Pain description: neuropathic pain 10/10, sciatic pain  7/10 Aggravating factors: walking, standing  Relieving factors: some medications   PRECAUTIONS: Back and Fall, watch BPs  WEIGHT BEARING RESTRICTIONS: No  FALLS: Has patient fallen in last 6 months? No  LIVING ENVIRONMENT: Lives with: lives with their family Lives in: House/apartment Stairs: ground level but has 2 steps to go into living room;  has basement and second floor of home  Has following equipment at home: Walker - 4 wheeled, [place]   PLOF: Independent, Independent with basic ADLs, Independent with gait, and Independent with transfers  PATIENT GOALS: be able to get through daily routine easier and safer, try to reduce pain if able   OBJECTIVE:   DIAGNOSTIC FINDINGS: CLINICAL DATA:  Bilateral leg pains. History of herniated disc repair   EXAM: LUMBAR SPINE - 2-3 VIEW   COMPARISON:  Lumbar spine MRI 03/06/2022   FINDINGS: No evidence of acute fracture. Mild loss of disc height at L4-L5 and mild-moderate loss of disc height at L5-S1. Moderate lumbar facet arthropathy at L4-S1. Aortic atherosclerotic calcification.   IMPRESSION: No acute fracture. Degenerative changes at L4-S1 where there is mild-moderate disc space height loss and facet arthropathy.    COGNITION: Overall cognitive status: Within functional limits for tasks assessed   SENSATION: Hx severe polyneuropathy  , LTT OK actually able to pass screen   COORDINATION: Mild ataxia noted especially L UE with RAM motions   EDEMA:    MUSCLE TONE:   MUSCLE LENGTH:   DTRs: POSTURE:   LOWER EXTREMITY ROM:     Active  Right Eval Left Eval  Hip flexion    Hip extension    Hip abduction    Hip adduction    Hip internal rotation    Hip external rotation    Knee flexion    Knee extension    Ankle dorsiflexion    Ankle plantarflexion    Ankle inversion    Ankle eversion     (Blank rows = not tested)  LOWER EXTREMITY MMT:    MMT Right Eval Left Eval  Hip flexion    Hip extension    Hip  abduction    Hip adduction    Hip internal rotation    Hip external rotation    Knee flexion 4 4-  Knee extension 4+ 4-  Ankle dorsiflexion 4 4-  Ankle plantarflexion    Ankle inversion    Ankle eversion    (Blank rows = not tested)  BED MOBILITY:  Unable to assess at eval, BP  TRANSFERS: Assistive device utilized: Environmental consultant - 4 wheeled  Sit to stand: Modified independence Stand to sit: Modified independence Chair to chair:  Floor:   RAMP:    CURB:    STAIRS: Level of Assistance:  Stair Negotiation Technique:   Number of Stairs:   Height of Stairs:   Comments: unable to assess at eval, high BP   GAIT: Gait pattern: step through pattern, decreased arm swing- Right, decreased arm swing- Left, scissoring, ataxic, trendelenburg, trunk flexed, and narrow BOS Distance walked: in clinic distances  Assistive device utilized: None Level of assistance: CGA Comments: able to walk in clinic distances with min guard but unable to maintain straight line navigation, mild ataxia and scissoring pattern; needs rollator for stability and pain control with gait   FUNCTIONAL TESTS:  Tandem stance : 5-6 seconds MinA ataxic  5xSTS: 24 seconds BUE support on rollator  PATIENT SURVEYS:    TODAY'S TREATMENT:  DATE:   07/30/22  Beginning of session BP 159/73 HR 71 (seated) 2nd reading mid-session BP 147/67 HR 68 (seated)   Berg 28/56  NMR  Tandem stance 2x30 seconds B solid surface (up to [place]) Side stepping in // bars x4 rounds (up to [place]) Standing marches in // bars x20     Eval:   BP 181/83 HR 66 seated 170/71 HR 67 standing  182/81 HR 66 upon 3rd BP check as per pt request in sitting at EOS      PATIENT EDUCATION: Education details: education on BP in PT, f/u with MD and surgeon to clarify when back precautions will be lifting, PT approach to  minimize orthostatic hypotension (lots of seated rest breaks, BP checks), POC moving forward, exercise on TM and floor bike  Person educated: Patient and Spouse Education method: Explanation Education comprehension: verbalized understanding and needs further education  HOME EXERCISE PROGRAM: TBD   GOALS: Goals reviewed with patient? No  SHORT TERM GOALS: Target date: 08/23/2022  Will be compliant with appropriate progressive HEP  Baseline: Goal status: INITIAL  2.  Will be able to maintain tandem stance for 15 seconds with no more than min guard B Baseline:  Goal status: INITIAL  3.  Will be able to demonstrate correct technique for skin checks/wear schedule for new AFOs after he has them  Baseline:  Goal status: INITIAL  4.  Will score at least 34/56 on Berg  Baseline:  Goal status: INITIAL    LONG TERM GOALS: Target date: 09/20/2022  MMT to improve by at least 1 grade in all weak groups  Baseline:  Goal status: INITIAL  2.  Pain to be no more than 7/10 at worst  Baseline:  Goal status: INITIAL  3.  Will score at least 38/56 on Berg  Baseline:  Goal status: INITIAL  4.  Will be able to ambulate household distances with no device and no unsteadiness, and community distances with LRAD with minimal fall risk  Baseline:  Goal status: INITIAL  5.  Will be able to complete floor to stand transfers with no more than min guard assist  Baseline:  Goal status: INITIAL    ASSESSMENT:  CLINICAL IMPRESSION:   Tim Spencer arrives today doing well, checked BP and it was much better today, intermittently checked during session PRN. Focused on balance training this session as this is major impairment for him due to chronic neuropathy and as shown on poor Berg score. Very talkative today too which limited interventions.  L LE fatigues much easier than R LE with balance and functional task performance. Will update HEP next session.   OBJECTIVE IMPAIRMENTS: Abnormal gait,  decreased activity tolerance, decreased balance, decreased coordination, decreased knowledge of use of DME, difficulty walking, decreased strength, impaired sensation, and pain.   ACTIVITY LIMITATIONS: standing, squatting, stairs, transfers, and locomotion level  PARTICIPATION LIMITATIONS: driving, shopping, community activity, and yard work  PERSONAL FACTORS: Age, Behavior pattern, Fitness, Past/current experiences, and Time since onset of injury/illness/exacerbation are also affecting patient's functional outcome.   REHAB POTENTIAL: Fair complex medical history and multiple chronic health issues contributing to complaints   CLINICAL DECISION MAKING: Evolving/moderate complexity  EVALUATION COMPLEXITY: Moderate  PLAN:  PT FREQUENCY: 2x/week  PT DURATION: 8 weeks  PLANNED INTERVENTIONS: Therapeutic exercises, Therapeutic activity, Neuromuscular re-education, Balance training, Gait training, Patient/Family education, Self Care, Joint mobilization, Stair training, Orthotic/Fit training, DME instructions, Taping, Manual therapy, and Re-evaluation  PLAN FOR NEXT SESSION: BP systolic 580 goal per pt;  Eval was very limited due to BP, would focus on the functional and balance/strength/fall prevention. Has poor awareness of back precautions post-op, need to keep reminding him. Needs HEP    Zayna Toste U PT DPT PN2  07/30/2022, 8:43 AM

## 2022-07-30 NOTE — Telephone Encounter (Signed)
Attempted to call the number  that pt provided for his surgeon to check on duration of BLT precautions, however number did not appear to be correct. Will f/u on this next time he comes.   Ann Lions PT DPT PN2

## 2022-07-31 ENCOUNTER — Other Ambulatory Visit: Payer: Self-pay

## 2022-08-01 ENCOUNTER — Encounter: Payer: Self-pay | Admitting: Physical Therapy

## 2022-08-01 ENCOUNTER — Ambulatory Visit: Payer: BC Managed Care – PPO | Admitting: Physical Therapy

## 2022-08-01 ENCOUNTER — Encounter: Payer: Self-pay | Admitting: Cardiology

## 2022-08-01 ENCOUNTER — Ambulatory Visit: Payer: BC Managed Care – PPO | Admitting: Cardiology

## 2022-08-01 VITALS — BP 187/86 | HR 74 | Resp 16 | Ht 67.0 in | Wt 181.0 lb

## 2022-08-01 DIAGNOSIS — R2689 Other abnormalities of gait and mobility: Secondary | ICD-10-CM

## 2022-08-01 DIAGNOSIS — I10 Essential (primary) hypertension: Secondary | ICD-10-CM

## 2022-08-01 DIAGNOSIS — M79605 Pain in left leg: Secondary | ICD-10-CM

## 2022-08-01 DIAGNOSIS — M6281 Muscle weakness (generalized): Secondary | ICD-10-CM

## 2022-08-01 DIAGNOSIS — N1832 Chronic kidney disease, stage 3b: Secondary | ICD-10-CM | POA: Insufficient documentation

## 2022-08-01 DIAGNOSIS — R2681 Unsteadiness on feet: Secondary | ICD-10-CM

## 2022-08-01 DIAGNOSIS — M79604 Pain in right leg: Secondary | ICD-10-CM

## 2022-08-01 DIAGNOSIS — I5032 Chronic diastolic (congestive) heart failure: Secondary | ICD-10-CM | POA: Insufficient documentation

## 2022-08-01 MED ORDER — TORSEMIDE 20 MG PO TABS: 20.0000 mg | ORAL_TABLET | Freq: Two times a day (BID) | ORAL | 2 refills | Status: AC

## 2022-08-01 NOTE — Therapy (Signed)
OUTPATIENT PHYSICAL THERAPY NEURO TREATMENT   Patient Name: Diontae Route MRN: [id number] DOB:[birth year], 61 y.o., male Today's Date: 08/01/2022   PCP: Lin Landsman MD  REFERRING PROVIDER: Marcial Pacas, MD    PT End of Session - 08/01/22 1402     Visit Number 3    Number of Visits 17    Date for PT Re-Evaluation 09/20/22    Authorization Type [employer]    Authorization Time Period 07/26/22 to 09/20/22    Authorization - Number of Visits 16    PT Start Time 1401    PT Stop Time 1442    PT Time Calculation (min) 41 min    Equipment Utilized During Treatment Gait belt    Activity Tolerance Patient tolerated treatment well    Behavior During Therapy WFL for tasks assessed/performed               Past Medical History:  Diagnosis Date   Anemia    CKD (chronic kidney disease) stage 3, GFR 30-59 ml/min (HCC)    Hypertension    Idiopathic progressive neuropathy    Secondary hyperparathyroidism, renal ([place])    Type 2 diabetes mellitus ([place])    Past Surgical History:  Procedure Laterality Date   GIVENS CAPSULE STUDY N/A 09/01/2020   Procedure: GIVENS CAPSULE STUDY;  Surgeon: Juanita Craver, MD;  Location: [facility] ENDOSCOPY;  Service: Endoscopy;  Laterality: N/A;   Patient Active Problem List   Diagnosis Date Noted   Chronic heart failure with preserved ejection fraction ([place]) 08/01/2022   Stage 3b chronic kidney disease ([place]) 08/01/2022   Spinal stenosis of lumbar region 03/07/2022   Gait abnormality 01/31/2022   Peripheral neuropathy 01/31/2022   Chronic left-sided low back pain with left-sided sciatica 01/31/2022   Abnormal stress test 08/27/2021   Heart failure (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)    NSTEMI (non-ST elevated myocardial  infarction) ([place]) 07/21/2021   AKI (acute kidney injury) ([place])    Bilateral lower extremity edema    Anemia    Hypertensive urgency 06/28/2021   Chronic kidney disease, stage 3a ([place])    Insulin-requiring or dependent type II diabetes mellitus ([place])    Hypothyroidism    Hyponatremia 07/05/2020   Orthostatic hypotension 06/06/2020   Bruit of right carotid artery 06/06/2020    ONSET DATE: 07/16/2022   REFERRING DIAG: M48.061 (ICD-10-CM) - Spinal stenosis of lumbar region, unspecified whether neurogenic claudication present G62.89 (ICD-10-CM) - Other polyneuropathy R26.9 (ICD-10-CM) - Gait abnormality   THERAPY DIAG:  Unsteadiness on feet  Pain in both lower extremities  Muscle weakness (generalized)  Other abnormalities of gait and mobility  Rationale for Evaluation and Treatment: Rehabilitation  SUBJECTIVE:  SUBJECTIVE STATEMENT:  I went to the cardiologist today, BP was high and told me that I'm retaining a lot of water and adjusted some meds. Felt OK after last time. Having more pain in the back of my left leg I think it was from sitting on the toilet for awhile.   Pt accompanied by: significant other  PERTINENT HISTORY: ASSESSMENT AND PLAN   Enrigue Mark is a 61 y.o. male     Severe lumbar stenosis at L4-5, Bilateral hands paresthesia Gait abnormalities             Status post decompression surgery on June 05, 2022 recovered well,             Still have residual right low back pain, radiating pain to right lower extremity, bilateral lower extremity neuropathic pain, responding well to Cymbalta, but has GI side effect, will slow titrating 20 mg / 40 mg daily             EMG nerve conduction study, for evaluation of superimposed peripheral neuropathy, bilateral upper extremity  focal neuropathy             Referred to physical therapy             Bilateral AFO     DIAGNOSTIC DATA (LABS, IMAGING, TESTING) - I reviewed patient records, labs, notes, testing and imaging myself where available.   Laboratory evaluations in February 2023, elevated TSH 6.720, normal free T4, triglyceride was elevated 299, LDL was 94 glucose was 227, creatinine was 3, A1c was 7.0, hemoglobin was 12.2,   MEDICAL HISTORY:   Mykle Pascua, is a 61 year old male, seen in request by his primary care physician Dr. Ayesha Rumpf, [place], companied by his wife for evaluation of worsening bilateral lower extremity paresthesia, unsteady gait, fall,   I reviewed and summarized the referring note. PMHX. HLD HTN DM 21 years CKD   Patient has diabetes for more than 20 years, around 2010, he began to notice bilateral feet numbness tingling, gradually getting worse over the past many years, now extending to knee level, also developed variable degree are sharp pain involving his lower extremity, needle prick sensation, burning, difficulty bearing weight,  He began to use cane few years back, now reliant on walker for longer distance since 2020  He also reported a history of lumbar minimally invasive procedure for low back pain left lumbar radicular pain in the past, now presenting with worsening lower extremity pain, radiating pain to left hip,  He also diagnosed with orthostatic hypotension, is on 3 agents for blood pressure control, this including Coreg 25 mg twice a day, clonidine 0.2 mg half tablets every day, isosorbide/hydrochlorothiazide 1 tablet 3 times a day,  He complains of declining functional status over the past few years, rely on his cane and walker for longer distance, transient sharp pain of bilateral lower extremity, despite uptitrating dose of gabapentin up to 300 mg 3 times a day, not a good candidate for higher dose due to chronic kidney disease,  His neuropathy symptoms continue to  progress, now began to involving bilateral upper extremities since 2023, he denies bowel and bladder incontinence, is able to transfer himself in and out of wheelchair PAIN:  Are you having pain? Yes: NPRS scale: 6-7/10 Pain location: back of RLE  Pain description: sciatic pain is better, current pain is not sciatic- like a muscle pain  Aggravating factors: standing, sitting on toilet with something hard pressing on this  Relieving factors: some medications  PRECAUTIONS: Back and Fall, watch BPs  WEIGHT BEARING RESTRICTIONS: No  FALLS: Has patient fallen in last 6 months? No  LIVING ENVIRONMENT: Lives with: lives with their family Lives in: House/apartment Stairs: ground level but has 2 steps to go into living room; has basement and second floor of home  Has following equipment at home: Walker - 4 wheeled, [place]   PLOF: Independent, Independent with basic ADLs, Independent with gait, and Independent with transfers  PATIENT GOALS: be able to get through daily routine easier and safer, try to reduce pain if able   OBJECTIVE:   DIAGNOSTIC FINDINGS: CLINICAL DATA:  Bilateral leg pains. History of herniated disc repair   EXAM: LUMBAR SPINE - 2-3 VIEW   COMPARISON:  Lumbar spine MRI 03/06/2022   FINDINGS: No evidence of acute fracture. Mild loss of disc height at L4-L5 and mild-moderate loss of disc height at L5-S1. Moderate lumbar facet arthropathy at L4-S1. Aortic atherosclerotic calcification.   IMPRESSION: No acute fracture. Degenerative changes at L4-S1 where there is mild-moderate disc space height loss and facet arthropathy.    COGNITION: Overall cognitive status: Within functional limits for tasks assessed   SENSATION: Hx severe polyneuropathy  , LTT OK actually able to pass screen   COORDINATION: Mild ataxia noted especially L UE with RAM motions   EDEMA:    MUSCLE TONE:   MUSCLE LENGTH:   DTRs: POSTURE:   LOWER EXTREMITY ROM:     Active  Right Eval  Left Eval  Hip flexion    Hip extension    Hip abduction    Hip adduction    Hip internal rotation    Hip external rotation    Knee flexion    Knee extension    Ankle dorsiflexion    Ankle plantarflexion    Ankle inversion    Ankle eversion     (Blank rows = not tested)  LOWER EXTREMITY MMT:    MMT Right Eval Left Eval  Hip flexion    Hip extension    Hip abduction    Hip adduction    Hip internal rotation    Hip external rotation    Knee flexion 4 4-  Knee extension 4+ 4-  Ankle dorsiflexion 4 4-  Ankle plantarflexion    Ankle inversion    Ankle eversion    (Blank rows = not tested)  BED MOBILITY:  Unable to assess at eval, BP  TRANSFERS: Assistive device utilized: Environmental consultant - 4 wheeled  Sit to stand: Modified independence Stand to sit: Modified independence Chair to chair:  Floor:   RAMP:    CURB:    STAIRS: Level of Assistance:  Stair Negotiation Technique:   Number of Stairs:   Height of Stairs:   Comments: unable to assess at eval, high BP   GAIT: Gait pattern: step through pattern, decreased arm swing- Right, decreased arm swing- Left, scissoring, ataxic, trendelenburg, trunk flexed, and narrow BOS Distance walked: in clinic distances  Assistive device utilized: None Level of assistance: CGA Comments: able to walk in clinic distances with min guard but unable to maintain straight line navigation, mild ataxia and scissoring pattern; needs rollator for stability and pain control with gait   FUNCTIONAL TESTS:  Tandem stance : 5-6 seconds MinA ataxic  5xSTS: 24 seconds BUE support on rollator  PATIENT SURVEYS:    TODAY'S TREATMENT:  DATE:    08/01/22  BP 154/65 HR 69  Education on transfers with rollator- device placement/safety, brakes   NMR  Tandem 3x30 seconds B solid surface MinA (dizzy at end of exercise, BP  seated 151/66 HR 67) Narrow BOS solid surface 4x30 seconds Standing on blue foam pad 4x30 seconds (148/68 HR 65 after) SLS with one foot on 4 inch step 2x30 seconds B    07/30/22  Beginning of session BP 159/73 HR 71 (seated) 2nd reading mid-session BP 147/67 HR 68 (seated)   Berg 28/56  NMR  Tandem stance 2x30 seconds B solid surface (up to [place]) Side stepping in // bars x4 rounds (up to [place]) Standing marches in // bars x20     Eval:   BP 181/83 HR 66 seated 170/71 HR 67 standing  182/81 HR 66 upon 3rd BP check as per pt request in sitting at EOS      PATIENT EDUCATION: Education details: exercise form and purpose  Person educated: Patient and Spouse Education method: Explanation Education comprehension: verbalized understanding and needs further education  HOME EXERCISE PROGRAM: TBD   GOALS: Goals reviewed with patient? No  SHORT TERM GOALS: Target date: 08/23/2022  Will be compliant with appropriate progressive HEP  Baseline: Goal status: INITIAL  2.  Will be able to maintain tandem stance for 15 seconds with no more than min guard B Baseline:  Goal status: INITIAL  3.  Will be able to demonstrate correct technique for skin checks/wear schedule for new AFOs after he has them  Baseline:  Goal status: INITIAL  4.  Will score at least 34/56 on Berg  Baseline:  Goal status: INITIAL    LONG TERM GOALS: Target date: 09/20/2022  MMT to improve by at least 1 grade in all weak groups  Baseline:  Goal status: INITIAL  2.  Pain to be no more than 7/10 at worst  Baseline:  Goal status: INITIAL  3.  Will score at least 38/56 on Berg  Baseline:  Goal status: INITIAL  4.  Will be able to ambulate household distances with no device and no unsteadiness, and community distances with LRAD with minimal fall risk  Baseline:  Goal status: INITIAL  5.  Will be able to complete floor to stand transfers with no more than min guard assist  Baseline:  Goal  status: INITIAL    ASSESSMENT:  CLINICAL IMPRESSION:   Mr. Garraway arrives today doing well, BP doing better today but did do randomized checks during session. Education provided on safe use of/transfers with rollator, otherwise focused on balance training in // bars with BP checks. Very talkative today which again limited session, but very pleasant. Did better today, but unfortunately a bit limited due to R LE pain. Will continue to progress as able.   OBJECTIVE IMPAIRMENTS: Abnormal gait, decreased activity tolerance, decreased balance, decreased coordination, decreased knowledge of use of DME, difficulty walking, decreased strength, impaired sensation, and pain.   ACTIVITY LIMITATIONS: standing, squatting, stairs, transfers, and locomotion level  PARTICIPATION LIMITATIONS: driving, shopping, community activity, and yard work  PERSONAL FACTORS: Age, Behavior pattern, Fitness, Past/current experiences, and Time since onset of injury/illness/exacerbation are also affecting patient's functional outcome.   REHAB POTENTIAL: Fair complex medical history and multiple chronic health issues contributing to complaints   CLINICAL DECISION MAKING: Evolving/moderate complexity  EVALUATION COMPLEXITY: Moderate  PLAN:  PT FREQUENCY: 2x/week  PT DURATION: 8 weeks  PLANNED INTERVENTIONS: Therapeutic exercises, Therapeutic activity, Neuromuscular re-education, Balance training, Gait training, Patient/Family  education, Self Care, Joint mobilization, Stair training, Orthotic/Fit training, DME instructions, Taping, Manual therapy, and Re-evaluation  PLAN FOR NEXT SESSION: BP systolic 720 goal per pt; Eval was very limited due to BP, would focus on the functional and balance/strength/fall prevention. Has poor awareness of back precautions post-op, need to keep reminding him. Needs HEP    Irvine Glorioso U PT DPT PN2  08/01/2022, 2:42 PM

## 2022-08-01 NOTE — Progress Notes (Unsigned)
Patient referred by Lin Landsman, MD for leg edema, orthostatic hypotension  Subjective:   Tim Spencer, male    DOB: 1961-04-14, 61 y.o.   MRN: [id number]  No chief complaint on file.    HPI  61 y.o. Tim Spencer male with hypertension, CKD 3, idiopathic progressive neuropathy, orthostatic hypotension/ supine hypertension, hyponatremia.   Patient is doing well. Na is reportedly staying around 39. Orthostatic hypotension and supine hypertension continues, but much less symptomatic. Cr is up to 3. He has regular f/u w/Dr. Elmarie Shiley.    Current Outpatient Medications:    ALPRAZolam (XANAX) 0.5 MG tablet, Take 0.5 mg by mouth 3 (three) times daily., Disp: , Rfl:    aspirin EC 81 MG EC tablet, Take 1 tablet (81 mg total) by mouth daily. Swallow whole., Disp: 30 tablet, Rfl: 1   atorvastatin (LIPITOR) 20 MG tablet, Take 20 mg by mouth daily after lunch. , Disp: , Rfl:    carvedilol (COREG) 25 MG tablet, Take 1 tablet (25 mg total) by mouth 2 (two) times daily with a meal., Disp: 180 tablet, Rfl: 3   cloNIDine (CATAPRES) 0.2 MG tablet, Take 1 tablet (0.2 mg total) by mouth every evening. (Patient taking differently: Take 0.1 mg by mouth every evening.), Disp: 60 tablet, Rfl: 1   Continuous Blood Gluc Sensor (FREESTYLE LIBRE 2 SENSOR) MISC, Inject into the skin every 14 (fourteen) days., Disp: , Rfl:    diltiazem (CARDIZEM SR) 90 MG 12 hr capsule, Take 1 capsule (90 mg total) by mouth 2 (two) times daily., Disp: 180 capsule, Rfl: 3   DULoxetine (CYMBALTA) 20 MG capsule, One tab qam, 2tab qhs, Disp: 90 capsule, Rfl: 11   EUTHYROX 137 MCG tablet, Take 137 mcg by mouth daily., Disp: , Rfl:    ferrous sulfate 325 (65 FE) MG tablet, Take 650 mg by mouth daily after supper. , Disp: , Rfl:    fluticasone (FLONASE) 50 MCG/ACT nasal spray, Place 2 sprays into both nostrils daily., Disp: , Rfl:    gabapentin (NEURONTIN) 300 MG capsule, Take 300 mg by mouth 3 (three) times daily., Disp: , Rfl:     HUMALOG KWIKPEN 100 UNIT/ML KwikPen, Inject 10-15 Units into the skin 3 (three) times daily before meals. Sliding Scale Insulin, Disp: , Rfl:    Iron-FA-B Cmp-C-Biot-Probiotic (FUSION PLUS) CAPS, Take 1 capsule by mouth daily., Disp: , Rfl:    isosorbide-hydrALAZINE (BIDIL) 20-37.5 MG tablet, Take 1 tablet by mouth 3 (three) times daily., Disp: 30 tablet, Rfl: 1   magnesium oxide (MAG-OX) 400 MG tablet, Take 400 mg by mouth daily after lunch., Disp: , Rfl:    Multiple Vitamin (MULTIVITAMIN WITH MINERALS) TABS tablet, Take 1 tablet by mouth daily after lunch., Disp: , Rfl:    nitroGLYCERIN (NITROSTAT) 0.4 MG SL tablet, Place 1 tablet (0.4 mg total) under the tongue every 5 (five) minutes x 3 doses as needed for chest pain., Disp: 30 tablet, Rfl: 12   Omega-3 1000 MG CAPS, Take 2,000 mg by mouth at bedtime. , Disp: , Rfl:    ondansetron (ZOFRAN-ODT) 4 MG disintegrating tablet, Take 4 mg by mouth 3 (three) times daily., Disp: , Rfl:    torsemide (DEMADEX) 20 MG tablet, Take 1 tablet (20 mg total) by mouth 2 (two) times daily., Disp: 60 tablet, Rfl: 1   TOUJEO SOLOSTAR 300 UNIT/ML Solostar Pen, Inject 30 Units into the skin daily. 30, Disp: , Rfl:    vitamin B-12 (CYANOCOBALAMIN) 1000 MCG tablet, Take 1,000  mcg by mouth daily., Disp: , Rfl:     Cardiovascular and other pertinent studies:  EKG 08/01/2022: Sinus rythm 71 bpm Normal EKG  Lexiscan Tetrofosmin stress test 08/23/2021: Lexiscan nuclear stress test performed using 1-day protocol. Stress EKG is non-diagnostic, as this is pharmacological stress test. in addition, rest and stress EKG showed sinus rhythm, inferolateral T wave inversion.  SPECT images show medium sized, mild intensity, reversible perfusion defect in apical to basal, inferior/inferolateral myocardium.  Stress LVEF 50% Low risk study.  Echocardiogram 07/22/2021:  1. Left ventricular ejection fraction, by estimation, is 55 to 60%. The  left ventricle has normal function. The  left ventricle has no regional  wall motion abnormalities. There is mild left ventricular hypertrophy.  Left ventricular diastolic parameters are consistent with Grade II diastolic dysfunction (pseudonormalization).   2. Right ventricular systolic function is normal. The right ventricular  size is normal.   3. Left atrial size was mildly dilated.   4. The mitral valve is normal in structure. Trivial mitral valve  regurgitation. No evidence of mitral stenosis.   5. The aortic valve is tricuspid. Aortic valve regurgitation is not  visualized. No aortic stenosis is present.   6. The inferior vena cava is normal in size with greater than 50%  respiratory variability, suggesting right atrial pressure of 3 mmHg.   Carotid artery duplex  06/20/2020:  Minimal stenosis in the right internal carotid artery (1-15%).  Minimal stenosis in the left internal carotid artery (1-15%).  Mild heterogeneous plaque noted in R>L carotid arteries.  Antegrade right vertebral artery flow. Antegrade left vertebral artery  Flow.  EKG 06/06/2020: Sinus rhythm 70 bpm Occaisonal PVC   Recent labs: 07/12/2022: Glucose 123, BUN/Cr 24/2.17. EGFR 34. Na/K 141/4.8. Rest of the CMP normal   07/25/2021: Glucose 329, BUN/Cr 26/2.2. EGFR 33. Na/K 132/4.1. Albumin 2.6 H/H 7.9/23.4. MCV 90. Platelets 167 HbA1C 6.9% Chol 134, TG 40, HDL 44, LDL 82 TSH 4.4 normal    Review of Systems  Constitutional: Negative for malaise/fatigue.  Cardiovascular:  Negative for chest pain, dyspnea on exertion, leg swelling, palpitations and syncope.  Musculoskeletal:        Right toe pain  Neurological:  Positive for dizziness and light-headedness.         Vitals:   08/01/22 0907 08/01/22 0916  BP: (!) 195/89 (!) 187/86  Pulse: 76 74  Resp: 16   SpO2: 100% 99%     Body mass index is 28.35 kg/m. Filed Weights   08/01/22 0907  Weight: 181 lb (82.1 kg)     Objective:   Physical Exam Vitals and nursing note  reviewed.  Constitutional:      General: He is not in acute distress. Neck:     Vascular: No JVD.  Cardiovascular:     Rate and Rhythm: Normal rate and regular rhythm.     Pulses:          Carotid pulses are  on the right side with bruit.      Dorsalis pedis pulses are 2+ on the right side and 2+ on the left side.       Posterior tibial pulses are 2+ on the right side and 2+ on the left side.     Heart sounds: Normal heart sounds. No murmur heard. Pulmonary:     Effort: Pulmonary effort is normal.     Breath sounds: Normal breath sounds. No wheezing or rales.  Musculoskeletal:     Right lower leg: No edema.  Left lower leg: No edema.           Assessment & Recommendations:   61 y.o. Tim Spencer male with hypertension, CKD 3, idiopathic progressive neuropathy, orthostatic hypotension/ supine hypertension, hyponatremia.  Orthostatic hypotension/supine hypertension: Likely related to autonomic dysfunction in diabetic patient.  Leg edema likely combination of HFpEF and medication side effects. Continue diltiazem 180 mg daily, Carvedilol 25 mg bid.  Hyponatremia: Currently resolved.   Abnormal stress test: Mild inferior ischemia. No angina symptoms at this time. Continue medical management.   F/u in 6 months    Nigel Mormon, MD Pager: [phone] Office: [phone]

## 2022-08-02 ENCOUNTER — Encounter: Payer: Self-pay | Admitting: Cardiology

## 2022-08-07 ENCOUNTER — Ambulatory Visit: Payer: BC Managed Care – PPO | Admitting: Physical Therapy

## 2022-08-07 ENCOUNTER — Encounter: Payer: Self-pay | Admitting: Physical Therapy

## 2022-08-07 DIAGNOSIS — R2681 Unsteadiness on feet: Secondary | ICD-10-CM

## 2022-08-07 DIAGNOSIS — R2689 Other abnormalities of gait and mobility: Secondary | ICD-10-CM

## 2022-08-07 DIAGNOSIS — M79604 Pain in right leg: Secondary | ICD-10-CM

## 2022-08-07 DIAGNOSIS — M79605 Pain in left leg: Secondary | ICD-10-CM

## 2022-08-07 DIAGNOSIS — M6281 Muscle weakness (generalized): Secondary | ICD-10-CM

## 2022-08-07 NOTE — Therapy (Signed)
OUTPATIENT PHYSICAL THERAPY NEURO TREATMENT   Patient Name: Tim Spencer MRN: [id number] DOB:05/30/1961, 61 y.o., male Today's Date: 08/07/2022   PCP: Lin Landsman MD  REFERRING PROVIDER: Marcial Pacas, MD    PT End of Session - 08/07/22 1539     Visit Number 4    Number of Visits 17    Date for PT Re-Evaluation 09/20/22    Authorization Type [employer]    Authorization Time Period 07/26/22 to 09/20/22    Authorization - Number of Visits 16    PT Start Time 1450    PT Stop Time 1529    PT Time Calculation (min) 39 min    Equipment Utilized During Treatment Gait belt    Activity Tolerance Patient tolerated treatment well    Behavior During Therapy WFL for tasks assessed/performed                Past Medical History:  Diagnosis Date   Anemia    CKD (chronic kidney disease) stage 3, GFR 30-59 ml/min (HCC)    Hypertension    Idiopathic progressive neuropathy    Secondary hyperparathyroidism, renal ([place])    Type 2 diabetes mellitus ([place])    Past Surgical History:  Procedure Laterality Date   GIVENS CAPSULE STUDY N/A 09/01/2020   Procedure: GIVENS CAPSULE STUDY;  Surgeon: Juanita Craver, MD;  Location: [facility] ENDOSCOPY;  Service: Endoscopy;  Laterality: N/A;   Patient Active Problem List   Diagnosis Date Noted   Chronic heart failure with preserved ejection fraction ([place]) 08/01/2022   Stage 3b chronic kidney disease ([place]) 08/01/2022   Spinal stenosis of lumbar region 03/07/2022   Gait abnormality 01/31/2022   Peripheral neuropathy 01/31/2022   Chronic left-sided low back pain with left-sided sciatica 01/31/2022   Abnormal stress test 08/27/2021   Heart failure ([place]) 08/14/2021   Supine hypertension 08/14/2021   Angina pectoris ([place]) 08/14/2021   Acute heart failure with preserved ejection fraction (HFpEF) (HCC)    Elevated troponin    Left carotid bruit    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)    NSTEMI (non-ST elevated myocardial  infarction) ([place]) 07/21/2021   AKI (acute kidney injury) ([place])    Bilateral lower extremity edema    Anemia    Hypertensive urgency 06/28/2021   Chronic kidney disease, stage 3a ([place])    Insulin-requiring or dependent type II diabetes mellitus ([place])    Hypothyroidism    Hyponatremia 07/05/2020   Orthostatic hypotension 06/06/2020   Bruit of right carotid artery 06/06/2020    ONSET DATE: 07/16/2022   REFERRING DIAG: M48.061 (ICD-10-CM) - Spinal stenosis of lumbar region, unspecified whether neurogenic claudication present G62.89 (ICD-10-CM) - Other polyneuropathy R26.9 (ICD-10-CM) - Gait abnormality   THERAPY DIAG:  Unsteadiness on feet  Pain in both lower extremities  Muscle weakness (generalized)  Other abnormalities of gait and mobility  Rationale for Evaluation and Treatment: Rehabilitation  SUBJECTIVE:  SUBJECTIVE STATEMENT:  I went to the cardiologist today, BP was high and told me that I'm retaining a lot of water and adjusted some meds. Felt OK after last time. Having more pain in the back of my left leg I think it was from sitting on the toilet for awhile.   Pt accompanied by: significant other  PERTINENT HISTORY: ASSESSMENT AND PLAN   Tim Spencer is a 61 y.o. male     Severe lumbar stenosis at L4-5, Bilateral hands paresthesia Gait abnormalities             Status post decompression surgery on June 05, 2022 recovered well,             Still have residual right low back pain, radiating pain to right lower extremity, bilateral lower extremity neuropathic pain, responding well to Cymbalta, but has GI side effect, will slow titrating 20 mg / 40 mg daily             EMG nerve conduction study, for evaluation of superimposed peripheral neuropathy, bilateral upper extremity  focal neuropathy             Referred to physical therapy             Bilateral AFO     DIAGNOSTIC DATA (LABS, IMAGING, TESTING) - I reviewed patient records, labs, notes, testing and imaging myself where available.   Laboratory evaluations in February 2023, elevated TSH 6.720, normal free T4, triglyceride was elevated 299, LDL was 94 glucose was 227, creatinine was 3, A1c was 7.0, hemoglobin was 12.2,   MEDICAL HISTORY:   Tim Spencer, is a 61 year old male, seen in request by his primary care physician Dr. Ayesha Rumpf, [place], companied by his wife for evaluation of worsening bilateral lower extremity paresthesia, unsteady gait, fall,   I reviewed and summarized the referring note. PMHX. HLD HTN DM 21 years CKD   Patient has diabetes for more than 20 years, around 2010, he began to notice bilateral feet numbness tingling, gradually getting worse over the past many years, now extending to knee level, also developed variable degree are sharp pain involving his lower extremity, needle prick sensation, burning, difficulty bearing weight,  He began to use cane few years back, now reliant on walker for longer distance since 2020  He also reported a history of lumbar minimally invasive procedure for low back pain left lumbar radicular pain in the past, now presenting with worsening lower extremity pain, radiating pain to left hip,  He also diagnosed with orthostatic hypotension, is on 3 agents for blood pressure control, this including Coreg 25 mg twice a day, clonidine 0.2 mg half tablets every day, isosorbide/hydrochlorothiazide 1 tablet 3 times a day,  He complains of declining functional status over the past few years, rely on his cane and walker for longer distance, transient sharp pain of bilateral lower extremity, despite uptitrating dose of gabapentin up to 300 mg 3 times a day, not a good candidate for higher dose due to chronic kidney disease,  His neuropathy symptoms continue to  progress, now began to involving bilateral upper extremities since 2023, he denies bowel and bladder incontinence, is able to transfer himself in and out of wheelchair PAIN:  Are you having pain? Yes: NPRS scale: 6-7/10 Pain location: back of RLE  Pain description: sciatic pain is better, current pain is not sciatic- like a muscle pain  Aggravating factors: standing, sitting on toilet with something hard pressing on this  Relieving factors: some medications  PRECAUTIONS: Back and Fall, watch BPs  WEIGHT BEARING RESTRICTIONS: No  FALLS: Has patient fallen in last 6 months? No  LIVING ENVIRONMENT: Lives with: lives with their family Lives in: House/apartment Stairs: ground level but has 2 steps to go into living room; has basement and second floor of home  Has following equipment at home: Walker - 4 wheeled, [place]   PLOF: Independent, Independent with basic ADLs, Independent with gait, and Independent with transfers  PATIENT GOALS: be able to get through daily routine easier and safer, try to reduce pain if able   OBJECTIVE:   DIAGNOSTIC FINDINGS: CLINICAL DATA:  Bilateral leg pains. History of herniated disc repair   EXAM: LUMBAR SPINE - 2-3 VIEW   COMPARISON:  Lumbar spine MRI 03/06/2022   FINDINGS: No evidence of acute fracture. Mild loss of disc height at L4-L5 and mild-moderate loss of disc height at L5-S1. Moderate lumbar facet arthropathy at L4-S1. Aortic atherosclerotic calcification.   IMPRESSION: No acute fracture. Degenerative changes at L4-S1 where there is mild-moderate disc space height loss and facet arthropathy.    COGNITION: Overall cognitive status: Within functional limits for tasks assessed   SENSATION: Hx severe polyneuropathy  , LTT OK actually able to pass screen   COORDINATION: Mild ataxia noted especially L UE with RAM motions   EDEMA:    MUSCLE TONE:   MUSCLE LENGTH:   DTRs: POSTURE:   LOWER EXTREMITY ROM:     Active  Right Eval  Left Eval  Hip flexion    Hip extension    Hip abduction    Hip adduction    Hip internal rotation    Hip external rotation    Knee flexion    Knee extension    Ankle dorsiflexion    Ankle plantarflexion    Ankle inversion    Ankle eversion     (Blank rows = not tested)  LOWER EXTREMITY MMT:    MMT Right Eval Left Eval  Hip flexion    Hip extension    Hip abduction    Hip adduction    Hip internal rotation    Hip external rotation    Knee flexion 4 4-  Knee extension 4+ 4-  Ankle dorsiflexion 4 4-  Ankle plantarflexion    Ankle inversion    Ankle eversion    (Blank rows = not tested)  BED MOBILITY:  Unable to assess at eval, BP  TRANSFERS: Assistive device utilized: Environmental consultant - 4 wheeled  Sit to stand: Modified independence Stand to sit: Modified independence Chair to chair:  Floor:   RAMP:    CURB:    STAIRS: Level of Assistance:  Stair Negotiation Technique:   Number of Stairs:   Height of Stairs:   Comments: unable to assess at eval, high BP   GAIT: Gait pattern: step through pattern, decreased arm swing- Right, decreased arm swing- Left, scissoring, ataxic, trendelenburg, trunk flexed, and narrow BOS Distance walked: in clinic distances  Assistive device utilized: None Level of assistance: CGA Comments: able to walk in clinic distances with min guard but unable to maintain straight line navigation, mild ataxia and scissoring pattern; needs rollator for stability and pain control with gait   FUNCTIONAL TESTS:  Tandem stance : 5-6 seconds MinA ataxic  5xSTS: 24 seconds BUE support on rollator  PATIENT SURVEYS:    TODAY'S TREATMENT:  DATE:   08/07/22  BP 150/71 HR 62 sitting BP after 5 min standing 131/69 HR 61  NMR  SLS on 4 inch box solid surface 3x30 seconds B  Marches onto 4 inch box x30 MinA no UEs Forward and  lateral toe taps off blue foam pad 2x10 each LE Backwards toe taps off blue foam pad 1x10 each LE  4 inch hurdles forwards x4 laps at first with BUE support progressing to U UE support 4 inch hurdles lateral step overs x4 laps total U UE support      08/01/22  BP 154/65 HR 69  Education on transfers with rollator- device placement/safety, brakes   NMR  Tandem 3x30 seconds B solid surface MinA (dizzy at end of exercise, BP seated 151/66 HR 67) Narrow BOS solid surface 4x30 seconds Standing on blue foam pad 4x30 seconds (148/68 HR 65 after) SLS with one foot on 4 inch step 2x30 seconds B    07/30/22  Beginning of session BP 159/73 HR 71 (seated) 2nd reading mid-session BP 147/67 HR 68 (seated)   Berg 28/56  NMR  Tandem stance 2x30 seconds B solid surface (up to [place]) Side stepping in // bars x4 rounds (up to [place]) Standing marches in // bars x20     Eval:   BP 181/83 HR 66 seated 170/71 HR 67 standing  182/81 HR 66 upon 3rd BP check as per pt request in sitting at EOS      PATIENT EDUCATION: Education details: exercise form and purpose  Person educated: Patient and Spouse Education method: Explanation Education comprehension: verbalized understanding and needs further education  HOME EXERCISE PROGRAM: TBD   GOALS: Goals reviewed with patient? No  SHORT TERM GOALS: Target date: 08/23/2022  Will be compliant with appropriate progressive HEP  Baseline: Goal status: INITIAL  2.  Will be able to maintain tandem stance for 15 seconds with no more than min guard B Baseline:  Goal status: INITIAL  3.  Will be able to demonstrate correct technique for skin checks/wear schedule for new AFOs after he has them  Baseline:  Goal status: INITIAL  4.  Will score at least 34/56 on Berg  Baseline:  Goal status: INITIAL    LONG TERM GOALS: Target date: 09/20/2022  MMT to improve by at least 1 grade in all weak groups  Baseline:  Goal status: INITIAL  2.   Pain to be no more than 7/10 at worst  Baseline:  Goal status: INITIAL  3.  Will score at least 38/56 on Berg  Baseline:  Goal status: INITIAL  4.  Will be able to ambulate household distances with no device and no unsteadiness, and community distances with LRAD with minimal fall risk  Baseline:  Goal status: INITIAL  5.  Will be able to complete floor to stand transfers with no more than min guard assist  Baseline:  Goal status: INITIAL    ASSESSMENT:  CLINICAL IMPRESSION:  Cheney arrives today doing OK, reports standing BP was low earlier but he took some salt and it improved. BPs all OK with checks this session. Continued focus on balance and initiated HEP, will continue to progress as able and tolerated.    OBJECTIVE IMPAIRMENTS: Abnormal gait, decreased activity tolerance, decreased balance, decreased coordination, decreased knowledge of use of DME, difficulty walking, decreased strength, impaired sensation, and pain.   ACTIVITY LIMITATIONS: standing, squatting, stairs, transfers, and locomotion level  PARTICIPATION LIMITATIONS: driving, shopping, community activity, and yard work  PERSONAL FACTORS: Age,  Behavior pattern, Fitness, Past/current experiences, and Time since onset of injury/illness/exacerbation are also affecting patient's functional outcome.   REHAB POTENTIAL: Fair complex medical history and multiple chronic health issues contributing to complaints   CLINICAL DECISION MAKING: Evolving/moderate complexity  EVALUATION COMPLEXITY: Moderate  PLAN:  PT FREQUENCY: 2x/week  PT DURATION: 8 weeks  PLANNED INTERVENTIONS: Therapeutic exercises, Therapeutic activity, Neuromuscular re-education, Balance training, Gait training, Patient/Family education, Self Care, Joint mobilization, Stair training, Orthotic/Fit training, DME instructions, Taping, Manual therapy, and Re-evaluation  PLAN FOR NEXT SESSION: BP systolic 370 goal per pt; watch BPs, orthostatic (but  can run super high too) would focus on the functional and balance/strength/fall prevention. Has poor awareness of back precautions post-op, need to keep reminding him.    Ann Lions PT DPT PN2  08/07/2022, 3:40 PM

## 2022-08-09 ENCOUNTER — Encounter: Payer: Self-pay | Admitting: Physical Therapy

## 2022-08-09 ENCOUNTER — Ambulatory Visit: Payer: BC Managed Care – PPO | Admitting: Physical Therapy

## 2022-08-09 DIAGNOSIS — M6281 Muscle weakness (generalized): Secondary | ICD-10-CM

## 2022-08-09 DIAGNOSIS — M79605 Pain in left leg: Secondary | ICD-10-CM

## 2022-08-09 DIAGNOSIS — R2681 Unsteadiness on feet: Secondary | ICD-10-CM

## 2022-08-09 DIAGNOSIS — R2689 Other abnormalities of gait and mobility: Secondary | ICD-10-CM

## 2022-08-09 NOTE — Therapy (Signed)
[facility] [address] [place], [place], [address] Phone: [phone]   Fax:  [phone]  Patient Details  Name: Tim Spencer MRN: [id number] Date of Birth: 01/23/1961 Referring Provider:  Lin Landsman, MD  Encounter Date: 08/09/2022  Pt entered clinic for PT as per usual today, but BPs were elevated as follows:  BP 172/76 HR 67 BP 170/66 HR 66 after 5 min of seated rest  BP 171/73HR 66 after 5 min of standing   Usually his BP will drop to a safe level with 5-10 minutes of standing activities when high, but did not today. We opted to hold therapy for today due to elevated pressures, no charge for today's visit.    Ann Lions PT DPT PN2  08/09/2022, 3:15 PM  [facility] [address] [place], [place], [address] Phone: [phone]   Fax:  [phone]

## 2022-08-13 ENCOUNTER — Ambulatory Visit: Payer: BC Managed Care – PPO | Admitting: Physical Therapy

## 2022-08-14 ENCOUNTER — Ambulatory Visit: Payer: BC Managed Care – PPO | Admitting: Physical Therapy

## 2022-08-14 ENCOUNTER — Encounter: Payer: Self-pay | Admitting: Physical Therapy

## 2022-08-14 DIAGNOSIS — R2681 Unsteadiness on feet: Secondary | ICD-10-CM

## 2022-08-14 DIAGNOSIS — R2689 Other abnormalities of gait and mobility: Secondary | ICD-10-CM

## 2022-08-14 DIAGNOSIS — M79604 Pain in right leg: Secondary | ICD-10-CM

## 2022-08-14 DIAGNOSIS — M6281 Muscle weakness (generalized): Secondary | ICD-10-CM

## 2022-08-14 NOTE — Therapy (Signed)
OUTPATIENT PHYSICAL THERAPY NEURO TREATMENT   Patient Name: Tim Spencer MRN: [id number] DOB:June 11, 1961, 61 y.o., male Today's Date: 08/14/2022   PCP: Lin Landsman MD  REFERRING PROVIDER: Marcial Pacas, MD    PT End of Session - 08/14/22 1542     Visit Number 5    Number of Visits 17    Date for PT Re-Evaluation 09/20/22    Authorization Type [employer]    Authorization Time Period 07/26/22 to 09/20/22    Authorization - Number of Visits 16    PT Start Time 1447    PT Stop Time 1529    PT Time Calculation (min) 42 min    Equipment Utilized During Treatment Gait belt    Activity Tolerance Patient tolerated treatment well    Behavior During Therapy WFL for tasks assessed/performed                 Past Medical History:  Diagnosis Date   Anemia    CKD (chronic kidney disease) stage 3, GFR 30-59 ml/min (HCC)    Hypertension    Idiopathic progressive neuropathy    Secondary hyperparathyroidism, renal ([place])    Type 2 diabetes mellitus ([place])    Past Surgical History:  Procedure Laterality Date   GIVENS CAPSULE STUDY N/A 09/01/2020   Procedure: GIVENS CAPSULE STUDY;  Surgeon: Juanita Craver, MD;  Location: [facility] ENDOSCOPY;  Service: Endoscopy;  Laterality: N/A;   Patient Active Problem List   Diagnosis Date Noted   Chronic heart failure with preserved ejection fraction ([place]) 08/01/2022   Stage 3b chronic kidney disease ([place]) 08/01/2022   Spinal stenosis of lumbar region 03/07/2022   Gait abnormality 01/31/2022   Peripheral neuropathy 01/31/2022   Chronic left-sided low back pain with left-sided sciatica 01/31/2022   Abnormal stress test 08/27/2021   Heart failure ([place]) 08/14/2021   Supine hypertension 08/14/2021   Angina pectoris ([place]) 08/14/2021   Acute heart failure with preserved ejection fraction (HFpEF) (HCC)    Elevated troponin    Left carotid bruit    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)    NSTEMI (non-ST elevated myocardial  infarction) ([place]) 07/21/2021   AKI (acute kidney injury) ([place])    Bilateral lower extremity edema    Anemia    Hypertensive urgency 06/28/2021   Chronic kidney disease, stage 3a ([place])    Insulin-requiring or dependent type II diabetes mellitus ([place])    Hypothyroidism    Hyponatremia 07/05/2020   Orthostatic hypotension 06/06/2020   Bruit of right carotid artery 06/06/2020    ONSET DATE: 07/16/2022   REFERRING DIAG: M48.061 (ICD-10-CM) - Spinal stenosis of lumbar region, unspecified whether neurogenic claudication present G62.89 (ICD-10-CM) - Other polyneuropathy R26.9 (ICD-10-CM) - Gait abnormality   THERAPY DIAG:  Unsteadiness on feet  Pain in both lower extremities  Muscle weakness (generalized)  Other abnormalities of gait and mobility  Rationale for Evaluation and Treatment: Rehabilitation  SUBJECTIVE:  SUBJECTIVE STATEMENT:  I'm good today, I took my BP medicine today; pressure was OK at my house then I took my medicines.   Pt accompanied by: significant other  PERTINENT HISTORY: ASSESSMENT AND PLAN   Tim Spencer is a 62 y.o. male     Severe lumbar stenosis at L4-5, Bilateral hands paresthesia Gait abnormalities             Status post decompression surgery on June 05, 2022 recovered well,             Still have residual right low back pain, radiating pain to right lower extremity, bilateral lower extremity neuropathic pain, responding well to Cymbalta, but has GI side effect, will slow titrating 20 mg / 40 mg daily             EMG nerve conduction study, for evaluation of superimposed peripheral neuropathy, bilateral upper extremity focal neuropathy             Referred to physical therapy             Bilateral AFO     DIAGNOSTIC DATA (LABS, IMAGING, TESTING) - I  reviewed patient records, labs, notes, testing and imaging myself where available.   Laboratory evaluations in February 2023, elevated TSH 6.720, normal free T4, triglyceride was elevated 299, LDL was 94 glucose was 227, creatinine was 3, A1c was 7.0, hemoglobin was 12.2,   MEDICAL HISTORY:   Tim Spencer, is a 61 year old male, seen in request by his primary care physician Dr. Ayesha Rumpf, [place], companied by his wife for evaluation of worsening bilateral lower extremity paresthesia, unsteady gait, fall,   I reviewed and summarized the referring note. PMHX. HLD HTN DM 21 years CKD   Patient has diabetes for more than 20 years, around 2010, he began to notice bilateral feet numbness tingling, gradually getting worse over the past many years, now extending to knee level, also developed variable degree are sharp pain involving his lower extremity, needle prick sensation, burning, difficulty bearing weight,  He began to use cane few years back, now reliant on walker for longer distance since 2020  He also reported a history of lumbar minimally invasive procedure for low back pain left lumbar radicular pain in the past, now presenting with worsening lower extremity pain, radiating pain to left hip,  He also diagnosed with orthostatic hypotension, is on 3 agents for blood pressure control, this including Coreg 25 mg twice a day, clonidine 0.2 mg half tablets every day, isosorbide/hydrochlorothiazide 1 tablet 3 times a day,  He complains of declining functional status over the past few years, rely on his cane and walker for longer distance, transient sharp pain of bilateral lower extremity, despite uptitrating dose of gabapentin up to 300 mg 3 times a day, not a good candidate for higher dose due to chronic kidney disease,  His neuropathy symptoms continue to progress, now began to involving bilateral upper extremities since 2023, he denies bowel and bladder incontinence, is able to transfer himself in  and out of wheelchair PAIN:  Are you having pain? Yes: NPRS scale: 7/10 Pain location: back of RLE and neuropathic pain Pain description: sciatic pain is better, current pain is not sciatic- like a muscle pain  Aggravating factors: standing, sitting on toilet with something hard pressing on this  Relieving factors: some medications   PRECAUTIONS: Fall and Other: BLT as tolerated per surgical nurse case manager , watch BPs   WEIGHT BEARING RESTRICTIONS: No  FALLS: Has patient fallen  in last 6 months? No  LIVING ENVIRONMENT: Lives with: lives with their family Lives in: House/apartment Stairs: ground level but has 2 steps to go into living room; has basement and second floor of home  Has following equipment at home: Walker - 4 wheeled, [place]   PLOF: Independent, Independent with basic ADLs, Independent with gait, and Independent with transfers  PATIENT GOALS: be able to get through daily routine easier and safer, try to reduce pain if able   OBJECTIVE:   DIAGNOSTIC FINDINGS: CLINICAL DATA:  Bilateral leg pains. History of herniated disc repair   EXAM: LUMBAR SPINE - 2-3 VIEW   COMPARISON:  Lumbar spine MRI 03/06/2022   FINDINGS: No evidence of acute fracture. Mild loss of disc height at L4-L5 and mild-moderate loss of disc height at L5-S1. Moderate lumbar facet arthropathy at L4-S1. Aortic atherosclerotic calcification.   IMPRESSION: No acute fracture. Degenerative changes at L4-S1 where there is mild-moderate disc space height loss and facet arthropathy.    COGNITION: Overall cognitive status: Within functional limits for tasks assessed   SENSATION: Hx severe polyneuropathy  , LTT OK actually able to pass screen   COORDINATION: Mild ataxia noted especially L UE with RAM motions   EDEMA:    MUSCLE TONE:   MUSCLE LENGTH:   DTRs: POSTURE:   LOWER EXTREMITY ROM:     Active  Right Eval Left Eval  Hip flexion    Hip extension    Hip abduction    Hip  adduction    Hip internal rotation    Hip external rotation    Knee flexion    Knee extension    Ankle dorsiflexion    Ankle plantarflexion    Ankle inversion    Ankle eversion     (Blank rows = not tested)  LOWER EXTREMITY MMT:    MMT Right Eval Left Eval  Hip flexion    Hip extension    Hip abduction    Hip adduction    Hip internal rotation    Hip external rotation    Knee flexion 4 4-  Knee extension 4+ 4-  Ankle dorsiflexion 4 4-  Ankle plantarflexion    Ankle inversion    Ankle eversion    (Blank rows = not tested)  BED MOBILITY:  Unable to assess at eval, BP  TRANSFERS: Assistive device utilized: Environmental consultant - 4 wheeled  Sit to stand: Modified independence Stand to sit: Modified independence Chair to chair:  Floor:   RAMP:    CURB:    STAIRS: Level of Assistance:  Stair Negotiation Technique:   Number of Stairs:   Height of Stairs:   Comments: unable to assess at eval, high BP   GAIT: Gait pattern: step through pattern, decreased arm swing- Right, decreased arm swing- Left, scissoring, ataxic, trendelenburg, trunk flexed, and narrow BOS Distance walked: in clinic distances  Assistive device utilized: None Level of assistance: CGA Comments: able to walk in clinic distances with min guard but unable to maintain straight line navigation, mild ataxia and scissoring pattern; needs rollator for stability and pain control with gait   FUNCTIONAL TESTS:  Tandem stance : 5-6 seconds MinA ataxic  5xSTS: 24 seconds BUE support on rollator  PATIENT SURVEYS:    TODAY'S TREATMENT:  DATE:   08/14/22  BP 167/74 HR 67 BPM seated BP standing x5 minutes  145/63 HR 64BPM  NMR  Standing on blue foam pad with external perturbations 3x60 seconds up to [place] for balance Balance recovery with posterior step recovery x10 B Mod cues/MinA for  balance Balance recovery with anterior step recovery x10 B Tandem stance on balance zone 2x30 seconds Side stepping on balance zone beam up to [place]         PATIENT EDUCATION: Education details: exercise form and purpose, medical need for PT to check blood pressures to make sure they are not too high or falling too low due to chronic orthostatic hypotension/complex medical management, PT's inability to release information without confirming that the number is for a medical office also working with the patient   Person educated: Patient and Spouse Education method: Explanation Education comprehension: verbalized understanding and needs further education  HOME EXERCISE PROGRAM: TBD   GOALS: Goals reviewed with patient? No  SHORT TERM GOALS: Target date: 08/23/2022  Will be compliant with appropriate progressive HEP  Baseline: Goal status: INITIAL  2.  Will be able to maintain tandem stance for 15 seconds with no more than min guard B Baseline:  Goal status: INITIAL  3.  Will be able to demonstrate correct technique for skin checks/wear schedule for new AFOs after he has them  Baseline:  Goal status: INITIAL  4.  Will score at least 34/56 on [place]  Baseline:  Goal status: INITIAL    LONG TERM GOALS: Target date: 09/20/2022  MMT to improve by at least 1 grade in all weak groups  Baseline:  Goal status: INITIAL  2.  Pain to be no more than 7/10 at worst  Baseline:  Goal status: INITIAL  3.  Will score at least 38/56 on [place]  Baseline:  Goal status: INITIAL  4.  Will be able to ambulate household distances with no device and no unsteadiness, and community distances with LRAD with minimal fall risk  Baseline:  Goal status: INITIAL  5.  Will be able to complete floor to stand transfers with no more than min guard assist  Baseline:  Goal status: INITIAL    ASSESSMENT:  CLINICAL IMPRESSION:  Tim Spencer arrives today doing OK, we checked BP and it was much  better as compared to last session. Continued to work on balance today, did well- he asked if his wife can video the session so they can do these same activities at home, educated that this is not appropriate or safe and to continue with current HEP. Pt also insisted on calling surgeon's office mid-session on speaker phone, discussed his back off of speaker phone with nurse case manager now basically OK for BLT and lifting as tolerated (was limited to 7-10# for the first month post-op then ok to add 5#/week after that as long as it is pain free). Did well today, will plan on updating HEP to include some strengthening next session.    OBJECTIVE IMPAIRMENTS: Abnormal gait, decreased activity tolerance, decreased balance, decreased coordination, decreased knowledge of use of DME, difficulty walking, decreased strength, impaired sensation, and pain.   ACTIVITY LIMITATIONS: standing, squatting, stairs, transfers, and locomotion level  PARTICIPATION LIMITATIONS: driving, shopping, community activity, and yard work  PERSONAL FACTORS: Age, Behavior pattern, Fitness, Past/current experiences, and Time since onset of injury/illness/exacerbation are also affecting patient's functional outcome.   REHAB POTENTIAL: Fair complex medical history and multiple chronic health issues contributing to complaints   CLINICAL DECISION MAKING:  Evolving/moderate complexity  EVALUATION COMPLEXITY: Moderate  PLAN:  PT FREQUENCY: 2x/week  PT DURATION: 8 weeks  PLANNED INTERVENTIONS: Therapeutic exercises, Therapeutic activity, Neuromuscular re-education, Balance training, Gait training, Patient/Family education, Self Care, Joint mobilization, Stair training, Orthotic/Fit training, DME instructions, Taping, Manual therapy, and Re-evaluation  PLAN FOR NEXT SESSION: BP  goal 150/80 per pt; watch BPs, orthostatic (but can run super high too) would focus on the functional and balance/strength/fall prevention. Add some  strength to HEP    Tim Spencer U PT DPT PN2  08/14/2022, 3:43 PM

## 2022-08-15 ENCOUNTER — Encounter: Payer: Self-pay | Admitting: Physical Therapy

## 2022-08-15 ENCOUNTER — Ambulatory Visit: Payer: BC Managed Care – PPO | Admitting: Physical Therapy

## 2022-08-15 VITALS — BP 112/48 | HR 58

## 2022-08-15 DIAGNOSIS — M6281 Muscle weakness (generalized): Secondary | ICD-10-CM

## 2022-08-15 DIAGNOSIS — R2681 Unsteadiness on feet: Secondary | ICD-10-CM

## 2022-08-15 DIAGNOSIS — R2689 Other abnormalities of gait and mobility: Secondary | ICD-10-CM

## 2022-08-15 NOTE — Therapy (Signed)
OUTPATIENT PHYSICAL THERAPY NEURO TREATMENT   Patient Name: Tim Spencer MRN: [id number] DOB:[birth year], 61 y.o., male Today's Date: 08/15/2022   PCP: Lin Landsman MD  REFERRING PROVIDER: Marcial Pacas, MD    PT End of Session - 08/15/22 1104     Visit Number 6    Number of Visits 17    Date for PT Re-Evaluation 09/20/22    Authorization Type [employer]    Authorization Time Period 07/26/22 to 09/20/22    Authorization - Number of Visits 16    PT Start Time 1103    PT Stop Time 1144    PT Time Calculation (min) 41 min    Equipment Utilized During Treatment Gait belt    Activity Tolerance Patient tolerated treatment well   limited by low BP   Behavior During Therapy WFL for tasks assessed/performed                 Past Medical History:  Diagnosis Date   Anemia    CKD (chronic kidney disease) stage 3, GFR 30-59 ml/min (HCC)    Hypertension    Idiopathic progressive neuropathy    Secondary hyperparathyroidism, renal ([place])    Type 2 diabetes mellitus ([place])    Past Surgical History:  Procedure Laterality Date   GIVENS CAPSULE STUDY N/A 09/01/2020   Procedure: GIVENS CAPSULE STUDY;  Surgeon: Juanita Craver, MD;  Location: [facility] ENDOSCOPY;  Service: Endoscopy;  Laterality: N/A;   Patient Active Problem List   Diagnosis Date Noted   Chronic heart failure with preserved ejection fraction ([place]) 08/01/2022   Stage 3b chronic kidney disease ([place]) 08/01/2022   Spinal stenosis of lumbar region 03/07/2022   Gait abnormality 01/31/2022   Peripheral neuropathy 01/31/2022   Chronic left-sided low back pain with left-sided sciatica 01/31/2022   Abnormal stress test 08/27/2021   Heart failure ([place]) 08/14/2021   Supine hypertension 08/14/2021   Angina pectoris ([place]) 08/14/2021   Acute heart failure with preserved ejection fraction (HFpEF) (HCC)    Elevated troponin    Left carotid bruit    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)    NSTEMI (non-ST  elevated myocardial infarction) ([place]) 07/21/2021   AKI (acute kidney injury) ([place])    Bilateral lower extremity edema    Anemia    Hypertensive urgency 06/28/2021   Chronic kidney disease, stage 3a ([place])    Insulin-requiring or dependent type II diabetes mellitus ([place])    Hypothyroidism    Hyponatremia 07/05/2020   Orthostatic hypotension 06/06/2020   Bruit of right carotid artery 06/06/2020    ONSET DATE: 07/16/2022   REFERRING DIAG: M48.061 (ICD-10-CM) - Spinal stenosis of lumbar region, unspecified whether neurogenic claudication present G62.89 (ICD-10-CM) - Other polyneuropathy R26.9 (ICD-10-CM) - Gait abnormality   THERAPY DIAG:  Unsteadiness on feet  Muscle weakness (generalized)  Other abnormalities of gait and mobility  Rationale for Evaluation and Treatment: Rehabilitation  SUBJECTIVE:  SUBJECTIVE STATEMENT:   No changes since she he was last here.   Pt accompanied by: significant other  PERTINENT HISTORY: ASSESSMENT AND PLAN   Tim Spencer is a 61 y.o. male     Severe lumbar stenosis at L4-5, Bilateral hands paresthesia Gait abnormalities             Status post decompression surgery on June 05, 2022 recovered well,             Still have residual right low back pain, radiating pain to right lower extremity, bilateral lower extremity neuropathic pain, responding well to Cymbalta, but has GI side effect, will slow titrating 20 mg / 40 mg daily             EMG nerve conduction study, for evaluation of superimposed peripheral neuropathy, bilateral upper extremity focal neuropathy             Referred to physical therapy             Bilateral AFO     DIAGNOSTIC DATA (LABS, IMAGING, TESTING) - I reviewed patient records, labs, notes, testing and imaging myself where  available.   Laboratory evaluations in February 2023, elevated TSH 6.720, normal free T4, triglyceride was elevated 299, LDL was 94 glucose was 227, creatinine was 3, A1c was 7.0, hemoglobin was 12.2,   MEDICAL HISTORY:   Tim Spencer, is a 61 year old male, seen in request by his primary care physician Dr. Ayesha Rumpf, [place], companied by his wife for evaluation of worsening bilateral lower extremity paresthesia, unsteady gait, fall,   I reviewed and summarized the referring note. PMHX. HLD HTN DM 21 years CKD   Patient has diabetes for more than 20 years, around 2010, he began to notice bilateral feet numbness tingling, gradually getting worse over the past many years, now extending to knee level, also developed variable degree are sharp pain involving his lower extremity, needle prick sensation, burning, difficulty bearing weight,  He began to use cane few years back, now reliant on walker for longer distance since 2020  He also reported a history of lumbar minimally invasive procedure for low back pain left lumbar radicular pain in the past, now presenting with worsening lower extremity pain, radiating pain to left hip,  He also diagnosed with orthostatic hypotension, is on 3 agents for blood pressure control, this including Coreg 25 mg twice a day, clonidine 0.2 mg half tablets every day, isosorbide/hydrochlorothiazide 1 tablet 3 times a day,  He complains of declining functional status over the past few years, rely on his cane and walker for longer distance, transient sharp pain of bilateral lower extremity, despite uptitrating dose of gabapentin up to 300 mg 3 times a day, not a good candidate for higher dose due to chronic kidney disease,  His neuropathy symptoms continue to progress, now began to involving bilateral upper extremities since 2023, he denies bowel and bladder incontinence, is able to transfer himself in and out of wheelchair PAIN:  Are you having pain? Yes: NPRS scale:  7/10 Pain location: LLE leg Pain description: sciatic pain is better, current pain is not sciatic- like a muscle pain  Aggravating factors: standing, sitting on toilet with something hard pressing on this  Relieving factors: some medications   5/10 pain in RLE.   PRECAUTIONS: Fall and Other: BLT as tolerated per surgical nurse case manager , watch BPs   WEIGHT BEARING RESTRICTIONS: No  FALLS: Has patient fallen in last 6 months? No  LIVING ENVIRONMENT: Lives  with: lives with their family Lives in: House/apartment Stairs: ground level but has 2 steps to go into living room; has basement and second floor of home  Has following equipment at home: Walker - 4 wheeled, [place]   PLOF: Independent, Independent with basic ADLs, Independent with gait, and Independent with transfers  PATIENT GOALS: be able to get through daily routine easier and safer, try to reduce pain if able   OBJECTIVE:   DIAGNOSTIC FINDINGS: CLINICAL DATA:  Bilateral leg pains. History of herniated disc repair   EXAM: LUMBAR SPINE - 2-3 VIEW   COMPARISON:  Lumbar spine MRI 03/06/2022   FINDINGS: No evidence of acute fracture. Mild loss of disc height at L4-L5 and mild-moderate loss of disc height at L5-S1. Moderate lumbar facet arthropathy at L4-S1. Aortic atherosclerotic calcification.   IMPRESSION: No acute fracture. Degenerative changes at L4-S1 where there is mild-moderate disc space height loss and facet arthropathy.    COGNITION: Overall cognitive status: Within functional limits for tasks assessed   SENSATION: Hx severe polyneuropathy  , LTT OK actually able to pass screen   COORDINATION: Mild ataxia noted especially L UE with RAM motions    LOWER EXTREMITY MMT:    MMT Right Eval Left Eval  Hip flexion    Hip extension    Hip abduction    Hip adduction    Hip internal rotation    Hip external rotation    Knee flexion 4 4-  Knee extension 4+ 4-  Ankle dorsiflexion 4 4-  Ankle  plantarflexion    Ankle inversion    Ankle eversion    (Blank rows = not tested)  BED MOBILITY:  Unable to assess at eval, BP  TRANSFERS: Assistive device utilized: Environmental consultant - 4 wheeled  Sit to stand: Modified independence Stand to sit: Modified independence Chair to chair:  Floor:    GAIT: Gait pattern: step through pattern, decreased arm swing- Right, decreased arm swing- Left, scissoring, ataxic, trendelenburg, trunk flexed, and narrow BOS Distance walked: in clinic distances  Assistive device utilized: None Level of assistance: CGA Comments: able to walk in clinic distances with min guard but unable to maintain straight line navigation, mild ataxia and scissoring pattern; needs rollator for stability and pain control with gait   FUNCTIONAL TESTS:  Tandem stance : 5-6 seconds MinA ataxic  5xSTS: 24 seconds BUE support on rollator    TODAY'S TREATMENT:                                                                                                                              DATE:   08/15/22  Vitals:   08/15/22 1108 08/15/22 1125 08/15/22 1132  BP: (!) 123/58 (!) 118/51 (!) 112/48  Pulse: 60 (!) 58 (!) 58   First 2 assessed in sitting, last assessed in standing.   NMR On air ex with feet apart: 10 reps head turns,  10 reps head nods, pt to need chair intermittently  for balance.   Access Code: [email or web address] URL: [email or web address] Date: 08/15/2022 Prepared by: Janann August  Added Tim exercises for HEP for balance. Pt needing cues for proper technique. See MedBridge for further details.   Exercises - Standing Single Leg Stance with Counter Support  - 2 x daily - 5 x weekly - 3 sets - 10-15 hold - Sit to Stand  - 2 x daily - 5 x weekly - 1 sets - 10 reps - Standing Hip Abduction with Counter Support  - 2 x daily - 5 x weekly - 1-2 sets - 10 reps   Therapeutic Exercise: With 3# ankle weight in sitting:   Seated marching x10 reps bilat, seated  marching with hip ABD stepping over 4" obstacle x10 reps  2 sets of 10 reps seated LAQs bilat with cues for 3 second hold, more challenged with LLE.   Therapeutic Activity: Monitored pt's BP throughout session in sitting/standing, with pt not orthostatic and asymptomatic throughout session, but did have lower BP values in sitting and standing. Pt monitors his BP at home and his cardiologist is aware of orthostatic hypotension.  Pt asking about trialing AFOs in session and practicing with them here. Pt has already gone to [facility] for AFOs and is waiting for them to be ready for him (unsure of what AFOs pt got). Discussed will practice with AFOs in clinic once pt gets his own.    PATIENT EDUCATION: Education details: Additions to HEP for balance  Person educated: Patient and Spouse Education method: Explanation, Demonstration, and Handouts Education comprehension: verbalized understanding and needs further education  HOME EXERCISE PROGRAM: [email or web address]  GOALS: Goals reviewed with patient? No  SHORT TERM GOALS: Target date: 08/23/2022  Will be compliant with appropriate progressive HEP  Baseline: Goal status: INITIAL  2.  Will be able to maintain tandem stance for 15 seconds with no more than min guard B Baseline:  Goal status: INITIAL  3.  Will be able to demonstrate correct technique for skin checks/wear schedule for Tim AFOs after he has them  Baseline:  Goal status: INITIAL  4.  Will score at least 34/56 on [place]  Baseline:  Goal status: INITIAL    LONG TERM GOALS: Target date: 09/20/2022  MMT to improve by at least 1 grade in all weak groups  Baseline:  Goal status: INITIAL  2.  Pain to be no more than 7/10 at worst  Baseline:  Goal status: INITIAL  3.  Will score at least 38/56 on Berg  Baseline:  Goal status: INITIAL  4.  Will be able to ambulate household distances with no device and no unsteadiness, and community distances with LRAD with minimal fall risk   Baseline:  Goal status: INITIAL  5.  Will be able to complete floor to stand transfers with no more than min guard assist  Baseline:  Goal status: INITIAL    ASSESSMENT:  CLINICAL IMPRESSION:  Added standing balance and strengthening to pt's HEP with pt tolerating well. Pt challenged by SLS and needing to use UE support. Pt with a drop in his diastolic BP from sitting to standing tasks. Pt reporting asymptomatic throughout session and took frequent rest breaks. Due to low diastolic value (see above), transitioned to seated strengthening tasks. Educated on importance of staying well hydrated due to low BP. Will continue to progress towards LTGs.    OBJECTIVE IMPAIRMENTS: Abnormal gait, decreased activity tolerance, decreased balance, decreased coordination, decreased knowledge of use of DME, difficulty walking, decreased strength, impaired sensation,  and pain.   ACTIVITY LIMITATIONS: standing, squatting, stairs, transfers, and locomotion level  PARTICIPATION LIMITATIONS: driving, shopping, community activity, and yard work  PERSONAL FACTORS: Age, Behavior pattern, Fitness, Past/current experiences, and Time since onset of injury/illness/exacerbation are also affecting patient's functional outcome.   REHAB POTENTIAL: Fair complex medical history and multiple chronic health issues contributing to complaints   CLINICAL DECISION MAKING: Evolving/moderate complexity  EVALUATION COMPLEXITY: Moderate  PLAN:  PT FREQUENCY: 2x/week  PT DURATION: 8 weeks  PLANNED INTERVENTIONS: Therapeutic exercises, Therapeutic activity, Neuromuscular re-education, Balance training, Gait training, Patient/Family education, Self Care, Joint mobilization, Stair training, Orthotic/Fit training, DME instructions, Taping, Manual therapy, and Re-evaluation  PLAN FOR NEXT SESSION: BP  goal 150/80 per pt; watch BPs, orthostatic (but can run super high too) would focus on the functional and balance/strength/fall  prevention. Add some any other balance to HEP.   Janann August, PT, DPT 08/15/22 11:56 AM

## 2022-08-20 ENCOUNTER — Telehealth: Payer: Self-pay

## 2022-08-20 NOTE — Telephone Encounter (Signed)
Patient is calling regards to a new medication he was prescribed by his nephrologist (Chlorthalidone 25 mg one tablet daily) . He is concerned that it may not be safe to take along side the medications that you gave him. He is requesting to speak with you directly, if possible. His call back number is, [phone].

## 2022-08-20 NOTE — Telephone Encounter (Signed)
Im not able to call right now, but please let him know medications is okay by me. If still has questions, I can call later today or tomorrow.  Thanks MJP

## 2022-08-20 NOTE — Telephone Encounter (Signed)
Called patient and let him know it was okay to take this medication Per Dr. Virgina Jock. He Acknowledged understanding, and had no further questions.

## 2022-08-21 ENCOUNTER — Encounter: Payer: Self-pay | Admitting: Physical Therapy

## 2022-08-21 ENCOUNTER — Ambulatory Visit: Payer: BC Managed Care – PPO | Admitting: Physical Therapy

## 2022-08-21 DIAGNOSIS — M79604 Pain in right leg: Secondary | ICD-10-CM

## 2022-08-21 DIAGNOSIS — R2681 Unsteadiness on feet: Secondary | ICD-10-CM | POA: Diagnosis not present

## 2022-08-21 DIAGNOSIS — R2689 Other abnormalities of gait and mobility: Secondary | ICD-10-CM

## 2022-08-21 DIAGNOSIS — M6281 Muscle weakness (generalized): Secondary | ICD-10-CM

## 2022-08-21 NOTE — Therapy (Signed)
OUTPATIENT PHYSICAL THERAPY NEURO TREATMENT   Patient Name: Tim Spencer MRN: [id number] DOB:01/10/1961, 61 y.o., male Today's Date: 08/21/2022   PCP: Lin Landsman MD  REFERRING PROVIDER: Marcial Pacas, MD    PT End of Session - 08/21/22 1418     Visit Number 7    Number of Visits 17    Date for PT Re-Evaluation 09/20/22    Authorization Type [employer]    Authorization Time Period 07/26/22 to 09/20/22    Authorization - Number of Visits 16    PT Start Time 1401    PT Stop Time 1442    PT Time Calculation (min) 41 min    Equipment Utilized During Treatment Gait belt    Activity Tolerance Patient tolerated treatment well    Behavior During Therapy WFL for tasks assessed/performed                  Past Medical History:  Diagnosis Date   Anemia    CKD (chronic kidney disease) stage 3, GFR 30-59 ml/min (HCC)    Hypertension    Idiopathic progressive neuropathy    Secondary hyperparathyroidism, renal ([place])    Type 2 diabetes mellitus ([place])    Past Surgical History:  Procedure Laterality Date   GIVENS CAPSULE STUDY N/A 09/01/2020   Procedure: GIVENS CAPSULE STUDY;  Surgeon: Juanita Craver, MD;  Location[facility] ENDOSCOPY;  Service: Endoscopy;  Laterality: N/A;   Patient Active Problem List   Diagnosis Date Noted   Chronic heart failure with preserved ejection fraction ([place]) 08/01/2022   Stage 3b chronic kidney disease ([place]) 08/01/2022   Spinal stenosis of lumbar region 03/07/2022   Gait abnormality 01/31/2022   Peripheral neuropathy 01/31/2022   Chronic left-sided low back pain with left-sided sciatica 01/31/2022   Abnormal stress test 08/27/2021   Heart failure ([employer]) 08/14/2021   Supine hypertension 08/14/2021   Angina pectoris ([place]) 08/14/2021   Acute heart failure with preserved ejection fraction (HFpEF) (HCC)    Elevated troponin    Left carotid bruit    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)    NSTEMI (non-ST elevated myocardial  infarction) ([place]) 07/21/2021   AKI (acute kidney injury) ([place])    Bilateral lower extremity edema    Anemia    Hypertensive urgency 06/28/2021   Chronic kidney disease, stage 3a ([place])    Insulin-requiring or dependent type II diabetes mellitus ([place])    Hypothyroidism    Hyponatremia 07/05/2020   Orthostatic hypotension 06/06/2020   Bruit of right carotid artery 06/06/2020    ONSET DATE: 07/16/2022   REFERRING DIAG: M48.061 (ICD-10-CM) - Spinal stenosis of lumbar region, unspecified whether neurogenic claudication present G62.89 (ICD-10-CM) - Other polyneuropathy R26.9 (ICD-10-CM) - Gait abnormality   THERAPY DIAG:  Unsteadiness on feet  Muscle weakness (generalized)  Other abnormalities of gait and mobility  Pain in both lower extremities  Rationale for Evaluation and Treatment: Rehabilitation  SUBJECTIVE:  SUBJECTIVE STATEMENT:   Felt good after last session, nothing new. Last time my BP was low so we did mostly sitting exercises. Getting AFO 12/20.   Pt accompanied by: significant other  PERTINENT HISTORY: ASSESSMENT AND PLAN   Tim Spencer is a 61 y.o. male     Severe lumbar stenosis at L4-5, Bilateral hands paresthesia Gait abnormalities             Status post decompression surgery on June 05, 2022 recovered well,             Still have residual right low back pain, radiating pain to right lower extremity, bilateral lower extremity neuropathic pain, responding well to Cymbalta, but has GI side effect, will slow titrating 20 mg / 40 mg daily             EMG nerve conduction study, for evaluation of superimposed peripheral neuropathy, bilateral upper extremity focal neuropathy             Referred to physical therapy             Bilateral AFO     DIAGNOSTIC DATA (LABS,  IMAGING, TESTING) - I reviewed patient records, labs, notes, testing and imaging myself where available.   Laboratory evaluations in February 2023, elevated TSH 6.720, normal free T4, triglyceride was elevated 299, LDL was 94 glucose was 227, creatinine was 3, A1c was 7.0, hemoglobin was 12.2,   MEDICAL HISTORY:   Tim Spencer, is a 61 year old male, seen in request by his primary care physician Dr. Ayesha Spencer, Tim Spencer, companied by his wife for evaluation of worsening bilateral lower extremity paresthesia, unsteady gait, fall,   I reviewed and summarized the referring note. PMHX. HLD HTN DM 21 years CKD   Patient has diabetes for more than 20 years, around 2010, he began to notice bilateral feet numbness tingling, gradually getting worse over the past many years, now extending to knee level, also developed variable degree are sharp pain involving his lower extremity, needle prick sensation, burning, difficulty bearing weight,  He began to use cane few years back, now reliant on walker for longer distance since 2020  He also reported a history of lumbar minimally invasive procedure for low back pain left lumbar radicular pain in the past, now presenting with worsening lower extremity pain, radiating pain to left hip,  He also diagnosed with orthostatic hypotension, is on 3 agents for blood pressure control, this including Coreg 25 mg twice a day, clonidine 0.2 mg half tablets every day, isosorbide/hydrochlorothiazide 1 tablet 3 times a day,  He complains of declining functional status over the past few years, rely on his cane and walker for longer distance, transient sharp pain of bilateral lower extremity, despite uptitrating dose of gabapentin up to 300 mg 3 times a day, not a good candidate for higher dose due to chronic kidney disease,  His neuropathy symptoms continue to progress, now began to involving bilateral upper extremities since 2023, he denies bowel and bladder incontinence, is able  to transfer himself in and out of wheelchair PAIN:  Are you having pain? Yes: NPRS scale: 7/10 Pain location: LLE leg Pain description: sciatic pain is better, current pain is not sciatic- like a muscle pain  Aggravating factors: standing, sitting on toilet with something hard pressing on this  Relieving factors: some medications     PRECAUTIONS: Fall and Other: BLT as tolerated per surgical nurse case manager , watch BPs   WEIGHT BEARING RESTRICTIONS: No  FALLS: Has patient  fallen in last 6 months? No  LIVING ENVIRONMENT: Lives with: lives with their family Lives in: House/apartment Stairs: ground level but has 2 steps to go into living room; has basement and second floor of home  Has following equipment at home: Walker - 4 wheeled, [place]   PLOF: Independent, Independent with basic ADLs, Independent with gait, and Independent with transfers  PATIENT GOALS: be able to get through daily routine easier and safer, try to reduce pain if able   OBJECTIVE:   DIAGNOSTIC FINDINGS: CLINICAL DATA:  Bilateral leg pains. History of herniated disc repair   EXAM: LUMBAR SPINE - 2-3 VIEW   COMPARISON:  Lumbar spine MRI 03/06/2022   FINDINGS: No evidence of acute fracture. Mild loss of disc height at L4-L5 and mild-moderate loss of disc height at L5-S1. Moderate lumbar facet arthropathy at L4-S1. Aortic atherosclerotic calcification.   IMPRESSION: No acute fracture. Degenerative changes at L4-S1 where there is mild-moderate disc space height loss and facet arthropathy.    COGNITION: Overall cognitive status: Within functional limits for tasks assessed   SENSATION: Hx severe polyneuropathy  , LTT OK actually able to pass screen   COORDINATION: Mild ataxia noted especially L UE with RAM motions    LOWER EXTREMITY MMT:    MMT Right Eval Left Eval  Hip flexion    Hip extension    Hip abduction    Hip adduction    Hip internal rotation    Hip external rotation    Knee  flexion 4 4-  Knee extension 4+ 4-  Ankle dorsiflexion 4 4-  Ankle plantarflexion    Ankle inversion    Ankle eversion    (Blank rows = not tested)  BED MOBILITY:  Unable to assess at eval, BP  TRANSFERS: Assistive device utilized: Environmental consultant - 4 wheeled  Sit to stand: Modified independence Stand to sit: Modified independence Chair to chair:  Floor:    GAIT: Gait pattern: step through pattern, decreased arm swing- Right, decreased arm swing- Left, scissoring, ataxic, trendelenburg, trunk flexed, and narrow BOS Distance walked: in clinic distances  Assistive device utilized: None Level of assistance: CGA Comments: able to walk in clinic distances with min guard but unable to maintain straight line navigation, mild ataxia and scissoring pattern; needs rollator for stability and pain control with gait   FUNCTIONAL TESTS:  Tandem stance : 5-6 seconds MinA ataxic  5xSTS: 24 seconds BUE support on rollator    TODAY'S TREATMENT:                                                                                                                              DATE:   08/21/22  BP sitting 167/76 HR 67 Standing x 5 minutes BP161/67 HR 64  TherEx  3 way hip red TB 1x10 B Sidesteps in // bars for hip ABD x5 laps red TB Hip hikes x10 B, then hip hikes with swing x10 B  NMR  Target taps while standing on blue foam pad x10 B up to MinA for balance, stance on R LE much more difficult  Target taps on solid surface (floor mat, cone, floor mat) x5 B     PATIENT EDUCATION: Education details: exercise form/purpose, HEP updates, reasoning for close BP monitoring in PT  Person educated: Patient and Spouse Education method: Explanation, Demonstration, and Handouts Education comprehension: verbalized understanding and needs further education  HOME EXERCISE PROGRAM: [id number]  GOALS: Goals reviewed with patient? No  SHORT TERM GOALS: Target date: 08/23/2022  Will be compliant with  appropriate progressive HEP  Baseline: Goal status: INITIAL  2.  Will be able to maintain tandem stance for 15 seconds with no more than min guard B Baseline:  Goal status: INITIAL  3.  Will be able to demonstrate correct technique for skin checks/wear schedule for new AFOs after he has them  Baseline:  Goal status: INITIAL  4.  Will score at least 34/56 on [place]  Baseline:  Goal status: INITIAL    LONG TERM GOALS: Target date: 09/20/2022  MMT to improve by at least 1 grade in all weak groups  Baseline:  Goal status: INITIAL  2.  Pain to be no more than 7/10 at worst  Baseline:  Goal status: INITIAL  3.  Will score at least 38/56 on [place]  Baseline:  Goal status: INITIAL  4.  Will be able to ambulate household distances with no device and no unsteadiness, and community distances with LRAD with minimal fall risk  Baseline:  Goal status: INITIAL  5.  Will be able to complete floor to stand transfers with no more than min guard assist  Baseline:  Goal status: INITIAL    ASSESSMENT:  CLINICAL IMPRESSION:  Doron arrives today doing well, BP was slightly high but closer to goal so we proceeded with standing exercises this afternoon. Introduced some standing strengthening tasks with resistance bands, otherwise continued with progression of balance training. Did well overall today. We are a bit overdue for goal check, will plan for this next session.    OBJECTIVE IMPAIRMENTS: Abnormal gait, decreased activity tolerance, decreased balance, decreased coordination, decreased knowledge of use of DME, difficulty walking, decreased strength, impaired sensation, and pain.   ACTIVITY LIMITATIONS: standing, squatting, stairs, transfers, and locomotion level  PARTICIPATION LIMITATIONS: driving, shopping, community activity, and yard work  PERSONAL FACTORS: Age, Behavior pattern, Fitness, Past/current experiences, and Time since onset of injury/illness/exacerbation are also  affecting patient's functional outcome.   REHAB POTENTIAL: Fair complex medical history and multiple chronic health issues contributing to complaints   CLINICAL DECISION MAKING: Evolving/moderate complexity  EVALUATION COMPLEXITY: Moderate  PLAN:  PT FREQUENCY: 2x/week  PT DURATION: 8 weeks  PLANNED INTERVENTIONS: Therapeutic exercises, Therapeutic activity, Neuromuscular re-education, Balance training, Gait training, Patient/Family education, Self Care, Joint mobilization, Stair training, Orthotic/Fit training, DME instructions, Taping, Manual therapy, and Re-evaluation  PLAN FOR NEXT SESSION: Needs goal check BP  goal 150/80 per pt; watch BPs, orthostatic (but can run super high too) would focus on the functional and balance/strength/fall prevention. Add some any other balance to HEP.   Ann Lions PT DPT PN2

## 2022-08-23 ENCOUNTER — Encounter: Payer: Self-pay | Admitting: Physical Therapy

## 2022-08-23 ENCOUNTER — Ambulatory Visit: Payer: BC Managed Care – PPO | Admitting: Physical Therapy

## 2022-08-23 DIAGNOSIS — M6281 Muscle weakness (generalized): Secondary | ICD-10-CM

## 2022-08-23 DIAGNOSIS — R2681 Unsteadiness on feet: Secondary | ICD-10-CM

## 2022-08-23 DIAGNOSIS — M79604 Pain in right leg: Secondary | ICD-10-CM

## 2022-08-23 DIAGNOSIS — R2689 Other abnormalities of gait and mobility: Secondary | ICD-10-CM

## 2022-08-23 NOTE — Therapy (Signed)
OUTPATIENT PHYSICAL THERAPY NEURO TREATMENT/Re-assessment   Patient Name: Collie Wernick MRN: [id number] DOB:21-May-1961, 61 y.o., male Today's Date: 08/23/2022   PCP: Lin Landsman MD  REFERRING PROVIDER: Marcial Pacas, MD    PT End of Session - 08/23/22 1618     Visit Number 8    Number of Visits 17    Date for PT Re-Evaluation 09/20/22    Authorization Type BCBS    Authorization Time Period 07/26/22 to 09/20/22    Authorization - Number of Visits 16    PT Start Time 1533    PT Stop Time 1611    PT Time Calculation (min) 38 min    Equipment Utilized During Treatment Gait belt    Activity Tolerance Patient tolerated treatment well    Behavior During Therapy WFL for tasks assessed/performed                   Past Medical History:  Diagnosis Date   Anemia    CKD (chronic kidney disease) stage 3, GFR 30-59 ml/min (HCC)    Hypertension    Idiopathic progressive neuropathy    Secondary hyperparathyroidism, renal ([place])    Type 2 diabetes mellitus ([place])    Past Surgical History:  Procedure Laterality Date   GIVENS CAPSULE STUDY N/A 09/01/2020   Procedure: GIVENS CAPSULE STUDY;  Surgeon: Juanita Craver, MD;  Location: [facility] ENDOSCOPY;  Service: Endoscopy;  Laterality: N/A;   Patient Active Problem List   Diagnosis Date Noted   Chronic heart failure with preserved ejection fraction ([place]) 08/01/2022   Stage 3b chronic kidney disease ([place]) 08/01/2022   Spinal stenosis of lumbar region 03/07/2022   Gait abnormality 01/31/2022   Peripheral neuropathy 01/31/2022   Chronic left-sided low back pain with left-sided sciatica 01/31/2022   Abnormal stress test 08/27/2021   Heart failure ([place]) 08/14/2021   Supine hypertension 08/14/2021   Angina pectoris ([place]) 08/14/2021   Acute heart failure with preserved ejection fraction (HFpEF) (HCC)    Elevated troponin    Left carotid bruit    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)    NSTEMI (non-ST  elevated myocardial infarction) ([place]) 07/21/2021   AKI (acute kidney injury) ([place])    Bilateral lower extremity edema    Anemia    Hypertensive urgency 06/28/2021   Chronic kidney disease, stage 3a ([place])    Insulin-requiring or dependent type II diabetes mellitus ([place])    Hypothyroidism    Hyponatremia 07/05/2020   Orthostatic hypotension 06/06/2020   Bruit of right carotid artery 06/06/2020    ONSET DATE: 07/16/2022   REFERRING DIAG: M48.061 (ICD-10-CM) - Spinal stenosis of lumbar region, unspecified whether neurogenic claudication present G62.89 (ICD-10-CM) - Other polyneuropathy R26.9 (ICD-10-CM) - Gait abnormality   THERAPY DIAG:  Unsteadiness on feet  Other abnormalities of gait and mobility  Muscle weakness (generalized)  Pain in both lower extremities  Rationale for Evaluation and Treatment: Rehabilitation  SUBJECTIVE:  SUBJECTIVE STATEMENT:   I have a new blood pressure medicine but I forgot the name, I took it today it was from my nephrologist and my cardiologist approved. Felt good after last time. Had soreness on the outside of my right leg the day after last time but then it went away. I've been having pain in my right big toe for years now, it used to bother me for just a day at a time, now it stays for a few days at a time.   Pt accompanied by: significant other  PERTINENT HISTORY: ASSESSMENT AND PLAN   Roland Widrig is a 61 y.o. male     Severe lumbar stenosis at L4-5, Bilateral hands paresthesia Gait abnormalities             Status post decompression surgery on June 05, 2022 recovered well,             Still have residual right low back pain, radiating pain to right lower extremity, bilateral lower extremity neuropathic pain, responding well to Cymbalta, but has GI  side effect, will slow titrating 20 mg / 40 mg daily             EMG nerve conduction study, for evaluation of superimposed peripheral neuropathy, bilateral upper extremity focal neuropathy             Referred to physical therapy             Bilateral AFO     DIAGNOSTIC DATA (LABS, IMAGING, TESTING) - I reviewed patient records, labs, notes, testing and imaging myself where available.   Laboratory evaluations in February 2023, elevated TSH 6.720, normal free T4, triglyceride was elevated 299, LDL was 94 glucose was 227, creatinine was 3, A1c was 7.0, hemoglobin was 12.2,   MEDICAL HISTORY:   Benedicto Capozzi, is a 61 year old male, seen in request by his primary care physician Dr. Ayesha Rumpf, [place], companied by his wife for evaluation of worsening bilateral lower extremity paresthesia, unsteady gait, fall,   I reviewed and summarized the referring note. PMHX. HLD HTN DM 21 years CKD   Patient has diabetes for more than 20 years, around 2010, he began to notice bilateral feet numbness tingling, gradually getting worse over the past many years, now extending to knee level, also developed variable degree are sharp pain involving his lower extremity, needle prick sensation, burning, difficulty bearing weight,  He began to use cane few years back, now reliant on walker for longer distance since 2020  He also reported a history of lumbar minimally invasive procedure for low back pain left lumbar radicular pain in the past, now presenting with worsening lower extremity pain, radiating pain to left hip,  He also diagnosed with orthostatic hypotension, is on 3 agents for blood pressure control, this including Coreg 25 mg twice a day, clonidine 0.2 mg half tablets every day, isosorbide/hydrochlorothiazide 1 tablet 3 times a day,  He complains of declining functional status over the past few years, rely on his cane and walker for longer distance, transient sharp pain of bilateral lower extremity,  despite uptitrating dose of gabapentin up to 300 mg 3 times a day, not a good candidate for higher dose due to chronic kidney disease,  His neuropathy symptoms continue to progress, now began to involving bilateral upper extremities since 2023, he denies bowel and bladder incontinence, is able to transfer himself in and out of wheelchair PAIN:  Are you having pain? Yes: NPRS scale: 7/10 Pain location: LLE leg, R  LE 5/10  Pain description: sciatic pain is better, current pain is not sciatic- like a muscle pain  Aggravating factors: standing, sitting on toilet with something hard pressing on this  Relieving factors: some medications     PRECAUTIONS: Fall and Other: BLT as tolerated per surgical nurse case manager , watch BPs   WEIGHT BEARING RESTRICTIONS: No  FALLS: Has patient fallen in last 6 months? No  LIVING ENVIRONMENT: Lives with: lives with their family Lives in: House/apartment Stairs: ground level but has 2 steps to go into living room; has basement and second floor of home  Has following equipment at home: Walker - 4 wheeled, [place]   PLOF: Independent, Independent with basic ADLs, Independent with gait, and Independent with transfers  PATIENT GOALS: be able to get through daily routine easier and safer, try to reduce pain if able   OBJECTIVE:   DIAGNOSTIC FINDINGS: CLINICAL DATA:  Bilateral leg pains. History of herniated disc repair   EXAM: LUMBAR SPINE - 2-3 VIEW   COMPARISON:  Lumbar spine MRI 03/06/2022   FINDINGS: No evidence of acute fracture. Mild loss of disc height at L4-L5 and mild-moderate loss of disc height at L5-S1. Moderate lumbar facet arthropathy at L4-S1. Aortic atherosclerotic calcification.   IMPRESSION: No acute fracture. Degenerative changes at L4-S1 where there is mild-moderate disc space height loss and facet arthropathy.    COGNITION: Overall cognitive status: Within functional limits for tasks assessed   SENSATION: Hx severe  polyneuropathy  , LTT OK actually able to pass screen   COORDINATION: Mild ataxia noted especially L UE with RAM motions    LOWER EXTREMITY MMT:    MMT Right Eval Left Eval R 11/30 L 11/30  Hip flexion      Hip extension      Hip abduction      Hip adduction      Hip internal rotation      Hip external rotation      Knee flexion 4 4- 4+ 4  Knee extension 4+ 4- 4 4  Ankle dorsiflexion 4 4- 4+ 3+  Ankle plantarflexion      Ankle inversion      Ankle eversion      (Blank rows = not tested)  BED MOBILITY:  Unable to assess at eval, BP  TRANSFERS: Assistive device utilized: Environmental consultant - 4 wheeled  Sit to stand: Modified independence Stand to sit: Modified independence Chair to chair:  Floor:    GAIT: Gait pattern: step through pattern, decreased arm swing- Right, decreased arm swing- Left, scissoring, ataxic, trendelenburg, trunk flexed, and narrow BOS Distance walked: in clinic distances  Assistive device utilized: None Level of assistance: CGA Comments: able to walk in clinic distances with min guard but unable to maintain straight line navigation, mild ataxia and scissoring pattern; needs rollator for stability and pain control with gait   FUNCTIONAL TESTS:  Tandem stance : 5-6 seconds MinA ataxic  5xSTS: 24 seconds BUE support on rollator    TODAY'S TREATMENT:  DATE:   08/23/22  BP after sitting about 5 minutes 161/69 HR 63 BP after static standing for 5 minutes 138/59 HR 61    PATIENT EDUCATION: Education details: reasoning for close BP monitoring in PT  especially in context of new BP med within the past 2 weeks, exam findings/progress towards goals, gastroc stretch on doorframe, role of electronics/fitness watches in health (IE for checking blood sugars and BP) and that these features are relatively new/likely not accurate yet, noted  delayed capillary refill/cold feet today and recommended f/u with MD on vascularization, importance of skin checks and blood sugar management in context of DM (pt very concerned he will eventually end up with amputation as this is common diabetics from an article he read), importance of using traditional BP cuff instead of wrist cuff for accurate measures, can have cardiologist calibrate his personal cuff if he is concerned it is not accurate, POC moving forward  Person educated: Patient and Spouse Education method: Explanation, Demonstration, and Handouts Education comprehension: verbalized understanding and needs further education  HOME EXERCISE PROGRAM: [id number]  GOALS: Goals reviewed with patient? No  SHORT TERM GOALS: Target date: 08/23/2022  Will be compliant with appropriate progressive HEP  Baseline: Goal status: MET 11/30 compliant with daily HEP   2.  Will be able to maintain tandem stance for 15 seconds with no more than min guard B Baseline:  Goal status: IN PROGRESS 11/30- min guard to occasional MinA   3.  Will be able to demonstrate correct technique for skin checks/wear schedule for new AFOs after he has them  Baseline:  Goal status: IN PROGRESS 11/30- doesn't see [facility] until 12/20, trying to get in sooner via wait list   4.  Will score at least 34/56 on Berg  Baseline:  Goal status: MET11/30- 37    LONG TERM GOALS: Target date: 09/20/2022  MMT to improve by at least 1 grade in all weak groups  Baseline:  Goal status: IN PROGRESS  2.  Pain to be no more than 7/10 at worst  Baseline:  Goal status: IN PROGRESS11/30- can get to higher than 7/10 when sleeping or on days he walks more   3.  Will score at least 38/56 on Berg  Baseline:  Goal status: IN PROGRESS 11/30-37   4.  Will be able to ambulate household distances with no device and no unsteadiness, and community distances with LRAD with minimal fall risk  Baseline:  Goal status: IN PROGRESS 11/30- DNT    5.  Will be able to complete floor to stand transfers with no more than min guard assist  Baseline:  Goal status: IN PROGRESS 11/30- DNT     ASSESSMENT:  CLINICAL IMPRESSION:  Rooney arrives today doing well, we spent this session getting objective measures. Does seem to be showing some improvement but still shows some impairments in balance and functional strength. Educated on lots of various topics today, had lots of questions about various health topics (see below for details). Will continue with skilled PT services as per POC.     OBJECTIVE IMPAIRMENTS: Abnormal gait, decreased activity tolerance, decreased balance, decreased coordination, decreased knowledge of use of DME, difficulty walking, decreased strength, impaired sensation, and pain.   ACTIVITY LIMITATIONS: standing, squatting, stairs, transfers, and locomotion level  PARTICIPATION LIMITATIONS: driving, shopping, community activity, and yard work  PERSONAL FACTORS: Age, Behavior pattern, Fitness, Past/current experiences, and Time since onset of injury/illness/exacerbation are also affecting patient's functional outcome.   REHAB POTENTIAL: Fair complex medical  history and multiple chronic health issues contributing to complaints   CLINICAL DECISION MAKING: Evolving/moderate complexity  EVALUATION COMPLEXITY: Moderate  PLAN:  PT FREQUENCY: 2x/week  PT DURATION: 8 weeks  PLANNED INTERVENTIONS: Therapeutic exercises, Therapeutic activity, Neuromuscular re-education, Balance training, Gait training, Patient/Family education, Self Care, Joint mobilization, Stair training, Orthotic/Fit training, DME instructions, Taping, Manual therapy, and Re-evaluation  PLAN FOR NEXT SESSION:  BP  goal 150/80 per pt; watch BPs, orthostatic (but can run super high too) would focus on the functional and balance/strength/fall prevention. Add some any other balance to HEP. Has appointment at [facility] 12/20 but trying to move it up    Tehya Leath U PT DPT PN2

## 2022-08-28 ENCOUNTER — Ambulatory Visit: Payer: BC Managed Care – PPO | Admitting: Neurology

## 2022-08-28 ENCOUNTER — Ambulatory Visit: Payer: BC Managed Care – PPO | Attending: Neurology | Admitting: Physical Therapy

## 2022-08-28 ENCOUNTER — Ambulatory Visit (INDEPENDENT_AMBULATORY_CARE_PROVIDER_SITE_OTHER): Payer: BC Managed Care – PPO | Admitting: Neurology

## 2022-08-28 ENCOUNTER — Encounter: Payer: Self-pay | Admitting: Neurology

## 2022-08-28 VITALS — BP 127/66 | HR 60

## 2022-08-28 VITALS — BP 151/79 | HR 60 | Ht 67.0 in | Wt 182.0 lb

## 2022-08-28 DIAGNOSIS — M6281 Muscle weakness (generalized): Secondary | ICD-10-CM | POA: Diagnosis present

## 2022-08-28 DIAGNOSIS — M48061 Spinal stenosis, lumbar region without neurogenic claudication: Secondary | ICD-10-CM

## 2022-08-28 DIAGNOSIS — R269 Unspecified abnormalities of gait and mobility: Secondary | ICD-10-CM

## 2022-08-28 DIAGNOSIS — R2689 Other abnormalities of gait and mobility: Secondary | ICD-10-CM | POA: Insufficient documentation

## 2022-08-28 DIAGNOSIS — G5603 Carpal tunnel syndrome, bilateral upper limbs: Secondary | ICD-10-CM | POA: Diagnosis not present

## 2022-08-28 DIAGNOSIS — M79604 Pain in right leg: Secondary | ICD-10-CM | POA: Diagnosis present

## 2022-08-28 DIAGNOSIS — E1142 Type 2 diabetes mellitus with diabetic polyneuropathy: Secondary | ICD-10-CM

## 2022-08-28 DIAGNOSIS — R2681 Unsteadiness on feet: Secondary | ICD-10-CM | POA: Insufficient documentation

## 2022-08-28 DIAGNOSIS — M79605 Pain in left leg: Secondary | ICD-10-CM | POA: Diagnosis present

## 2022-08-28 DIAGNOSIS — G6289 Other specified polyneuropathies: Secondary | ICD-10-CM

## 2022-08-28 NOTE — Progress Notes (Signed)
ASSESSMENT AND PLAN  Tim Spencer is a 61 y.o. male    Severe lumbar stenosis at L4-5, status post decompression on September 04, 2022 Severe diabetic peripheral neuropathy Bilateral carpal tunnel syndromes, right ulnar neuropathy across right elbow  His low back pain has much improved lumbar decompression surgery  Unsteady pain, but improved with physical therapy, fitted for left AFO,  Advising conservative treatment bilateral wrist splint for carpal tunnels syndrome,  Cymbalta 20 mg 3 tablets a day has helped his neuropathic pain, abnormal kidney function, gabapentin 300 mg 3 times daily as needed  Will return to clinic for new issues  DIAGNOSTIC DATA (LABS, IMAGING, TESTING) - I reviewed patient records, labs, notes, testing and imaging myself where available.  Laboratory evaluations in February 2023, elevated TSH 6.720, normal free T4, triglyceride was elevated 299, LDL was 94 glucose was 227, creatinine was 3, A1c was 7.0, hemoglobin was 12.2,  MEDICAL HISTORY:  Tim Spencer, is a 61 year old male, seen in request by his primary care physician Dr. Ayesha Rumpf, [place], companied by his wife for evaluation of worsening bilateral lower extremity paresthesia, unsteady gait, fall,  I reviewed and summarized the referring note. PMHX. HLD HTN DM 21 years CKD  Patient has diabetes for more than 20 years, around 2010, he began to notice bilateral feet numbness tingling, gradually getting worse over the past many years, now extending to knee level, also developed variable degree are sharp pain involving his lower extremity, needle prick sensation, burning, difficulty bearing weight,  He began to use cane few years back, now reliant on walker for longer distance since 2020  He also reported a history of lumbar minimally invasive procedure for low back pain left lumbar radicular pain in the past, now presenting with worsening lower extremity pain, radiating pain to left hip,  He also  diagnosed with orthostatic hypotension, is on 3 agents for blood pressure control, this including Coreg 25 mg twice a day, clonidine 0.2 mg half tablets every day, isosorbide/hydrochlorothiazide 1 tablet 3 times a day,  He complains of declining functional status over the past few years, rely on his cane and walker for longer distance, transient sharp pain of bilateral lower extremity, despite uptitrating dose of gabapentin up to 300 mg 3 times a day, not a good candidate for higher dose due to chronic kidney disease,  His neuropathy symptoms continue to progress, now began to involving bilateral upper extremities since 2023, he denies bowel and bladder incontinence, is able to transfer himself in and out of wheelchair   Virtual Visit via video UPDATE March 07 2022  Talked with patient and his wife about MRI findings, reviewed MRI through shared screening, mild degenerative changes of cervical spine, no evidence of spinal cord compression, variable degree of mild lower cervical foraminal stenosis.  Severe spinal stenosis at L4-5,  Patient reported a history of L4-5 disc herniation laser procedure many years ago,  He also complains of intermittent bilateral hands paresthesia mainly happen at nighttime, not during the day   Laboratory evaluation in May 2023 showed well-controlled diabetes A1c 6.7, negative RPR, HIV, B12, CPK, slight elevation of ESR 40, negative ANA, protein electrophoresis, vitamin D level, C-reactive protein,  His profound bilateral distal lower extremity sensory loss and weakness will certainly not be explained by his well-controlled diabetes.  UPDATE Jul 16 2022: He had lumbar decompression surgery at [facility] on Sept 12 2023, he recovered well, but he still has low back pain, radiating pain to right leg, bilateral foot and  shin pain, ankle swelling, gait abnormality, neuropathic pain  Update August 28, 2022 He is accompanied by his wife to return for electrodiagnostic  study, which showed evidence of severe axonal sensorimotor polyneuropathy, most consistent with diabetic peripheral neuropathy, with superimposed chronic left L4-5 S1 radiculopathy, moderate bilateral carpal tunnel syndromes, right ulnar neuropathy across right elbow    PHYSICAL EXAMNIATION: Vitals:   08/28/22 1316  Weight: 182 lb (82.6 kg)  Height: _0  (1.702 m)     Gen: NAD, conversant, well nourised, well groomed                     Cardiovascular: Regular rate rhythm, no peripheral edema, warm, nontender. Eyes: Conjunctivae clear without exudates or hemorrhage Neck: Supple, no carotid bruits. Pulmonary: Clear to auscultation bilaterally   NEUROLOGICAL EXAM:  MENTAL STATUS: Speech/cognition: Awake, alert oriented to history taking and casual conversation  CRANIAL NERVES: CN II: Visual fields are full to confrontation.  Pupils are round equal and briskly reactive to light. CN III, IV, VI: extraocular movement are normal. No ptosis. CN V: Facial sensation is intact to pinprick in all 3 divisions bilaterally. Corneal responses are intact.  CN VII: Face is symmetric with normal eye closure and smile. CN VIII: Hearing is normal to casual conversation CN IX, X: Palate elevates symmetrically. Phonation is normal. CN XI: Head turning and shoulder shrug are intact   MOTOR: Bilateral upper extremity proximal and distal motor strength is normal, atrophy of left distal leg, bilateral lower extremity proximal muscle strength is normal, moderate left more than right ankle dorsiflexion weakness, mild plantarflexion weakness  REFLEXES: Reflexes are 1 and symmetric at the biceps, triceps, absent at knees, and absent at ankles. Plantar responses are flexor.  SENSORY: Length-dependent decreased light touch pinprick to mid shin level, hairline receding to mid shin  COORDINATION: Rapid alternating movements and fine finger movements are intact. There is no dysmetria on finger-to-nose and  heel-knee-shin.    GAIT/STANCE: Need push-up to get up from seated position, bilateral foot drop, left worse than right, could not stand up on heels, tiptoe.    REVIEW OF SYSTEMS:  Full 14 system review of systems performed and notable only for as above All other review of systems were negative.   ALLERGIES: Allergies  Allergen Reactions   Pollen Extract     sneezing    HOME MEDICATIONS: Current Outpatient Medications  Medication Sig Dispense Refill   ALPRAZolam (XANAX) 0.5 MG tablet Take 0.5 mg by mouth 3 (three) times daily.     aspirin EC 81 MG EC tablet Take 1 tablet (81 mg total) by mouth daily. Swallow whole. 30 tablet 1   atorvastatin (LIPITOR) 20 MG tablet Take 20 mg by mouth daily after lunch.      carvedilol (COREG) 25 MG tablet Take 1 tablet (25 mg total) by mouth 2 (two) times daily with a meal. 180 tablet 3   chlorthalidone (HYGROTON) 25 MG tablet Take 25 mg by mouth daily.     cloNIDine (CATAPRES) 0.2 MG tablet Take 1 tablet (0.2 mg total) by mouth every evening. (Patient taking differently: Take 0.1-0.2 mg by mouth every evening.) 60 tablet 1   Continuous Blood Gluc Sensor (FREESTYLE LIBRE 2 SENSOR) MISC Inject into the skin every 14 (fourteen) days.     diltiazem (CARDIZEM SR) 90 MG 12 hr capsule Take 1 capsule (90 mg total) by mouth 2 (two) times daily. 180 capsule 3   DULoxetine (CYMBALTA) 20 MG capsule One tab qam,  2tab qhs 90 capsule 11   EUTHYROX 137 MCG tablet Take 137 mcg by mouth daily.     ferrous sulfate 325 (65 FE) MG tablet Take 650 mg by mouth daily after supper.      fluticasone (FLONASE) 50 MCG/ACT nasal spray Place 2 sprays into both nostrils daily.     gabapentin (NEURONTIN) 300 MG capsule Take 300 mg by mouth 3 (three) times daily.     HUMALOG KWIKPEN 100 UNIT/ML KwikPen Inject 10-15 Units into the skin 3 (three) times daily before meals. Sliding Scale Insulin     Iron-FA-B Cmp-C-Biot-Probiotic (FUSION PLUS) CAPS Take 1 capsule by mouth daily.      magnesium oxide (MAG-OX) 400 MG tablet Take 400 mg by mouth daily after lunch.     Multiple Vitamin (MULTIVITAMIN WITH MINERALS) TABS tablet Take 1 tablet by mouth daily after lunch.     nitroGLYCERIN (NITROSTAT) 0.4 MG SL tablet Place 1 tablet (0.4 mg total) under the tongue every 5 (five) minutes x 3 doses as needed for chest pain. 30 tablet 12   Omega-3 1000 MG CAPS Take 2,000 mg by mouth at bedtime.      ondansetron (ZOFRAN-ODT) 4 MG disintegrating tablet Take 4 mg by mouth 3 (three) times daily.     torsemide (DEMADEX) 20 MG tablet Take 1 tablet (20 mg total) by mouth 2 (two) times daily. 60 tablet 2   TOUJEO SOLOSTAR 300 UNIT/ML Solostar Pen Inject 30 Units into the skin daily. 30     vitamin B-12 (CYANOCOBALAMIN) 1000 MCG tablet Take 1,000 mcg by mouth daily.     No current facility-administered medications for this visit.    PAST MEDICAL HISTORY: Past Medical History:  Diagnosis Date   Anemia    CKD (chronic kidney disease) stage 3, GFR 30-59 ml/min (HCC)    Hypertension    Idiopathic progressive neuropathy    Secondary hyperparathyroidism, renal ([place])    Type 2 diabetes mellitus ([place])     PAST SURGICAL HISTORY: Past Surgical History:  Procedure Laterality Date   GIVENS CAPSULE STUDY N/A 09/01/2020   Procedure: GIVENS CAPSULE STUDY;  Surgeon: Juanita Craver, MD;  Location: [facility] END[facility];  Service: Endoscopy;  Laterality: N/A;   SPINE SURGERY  06/05/2022    FAMILY HISTORY: Family History  Problem Relation Age of Onset   Stroke Mother    Hypertension Mother    Diabetes Father     SOCIAL HISTORY: Social History   Socioeconomic History   Marital status: Married    Spouse name: Not on file   Number of children: 0   Years of education: Not on file   Highest education level: Not on file  Occupational History   Not on file  Tobacco Use   Smoking status: Never   Smokeless tobacco: Never  Vaping Use   Vaping Use: Never used  Substance and Sexual Activity   Alcohol  use: Never   Drug use: Never   Sexual activity: Not on file  Other Topics Concern   Not on file  Social History Narrative   Not on file   Social Determinants of Health   Financial Resource Strain: Not on file  Food Insecurity: Not on file  Transportation Needs: Not on file  Physical Activity: Not on file  Stress: Not on file  Social Connections: Not on file  Intimate Partner Violence: Not on file      Marcial Pacas, M.D. Ph.D.  [facility] [address], [place], [place] [address]  Ph: [phone] Fax: [phone]  CC:  Lin Landsman, MD [place],  [place] [id number]  Lin Landsman, MD

## 2022-08-28 NOTE — Procedures (Signed)
Full Name: Tim Spencer Gender: Male MRN #: [id number] Date of Birth: Nov 25, 1960    Visit Date: 08/28/2022 13:07 Age: 61 Years Examining Physician: Dr. Marcial Pacas Referring Physician: Dr. Marcial Pacas Height: 5 feet 7 inch History: 61 year old male with history of diabetes, lumbar stenosis status decompression, continue lower extremity paresthesia also intermittent upper extremity paresthesia  Summary of the test: Nerve conduction study: Right sural, superficial peroneal, bilateral median, ulnar sensory responses were absent.  Right radial sensory response showed mildly prolonged peak latency with significantly decreased snap amplitude.  Right peroneal to EDB, tibial motor responses were absent.  Bilateral median motor responses showed mild to moderately prolonged distal latency, left worse than right.  Bilateral ulnar motor responses showed mild to moderately prolonged distal latency, within normal range CMAP amplitude, right side showed 23 m/s amplitude drop across right elbow.  Electromyography: Selected needle examination was followed by bilateral lower extremity muscles, bilateral lumbosacral paraspinal muscles; bilateral abductor pollicis brevis.  There is evidence of chronic neuropathic changes, most noticeable at left lower extremity muscles especially left L4-5 S1 myotomes.  There was increased insertional activity, polyphasic motor unit potential at bilateral lumbosacral paraspinal muscles.  There is also evidence of increased insertional activity, no large complex motor unit potential with mildly decreased recruitment at bilateral abductor pollicis brevis, distal right leg muscles.  Conclusion: This is an abnormal study.  There is evidence of severe length-dependent axonal sensorimotor polyneuropathy, most consistent with diabetic peripheral neuropathy.  In addition, there is evidence of superimposed chronic left lumbosacral radiculopathy, involving left L4-5 S1 myotomes  the most.  He also has evidence of moderate bilateral carpal tunnel syndromes, mild right ulnar neuropathy across right elbow., demyelinating in nature.   ------------------------------- Catalina Pizza.D.Ph.D.  [facility] [address] [place], [place] [address] Tel: [phone] Fax: [phone]  Verbal informed consent was obtained from the patient, patient was informed of potential risk of procedure, including bruising, bleeding, hematoma formation, infection, muscle weakness, muscle pain, numbness, among others.        [place]    Nerve / Sites Muscle Latency Ref. Amplitude Ref. Rel Amp Segments Distance Velocity Ref. Area    ms ms mV mV %  cm m/s m/s mVms  R Median - APB     Wrist APB 4.9 ?4.4 8.3 ?4.0 100 Wrist - APB 7   28.4     Upper arm APB 9.2  7.7  92.5 Upper arm - Wrist 23 54 ?49 27.1  L Median - APB     Wrist APB 6.1 ?4.4 4.5 ?4.0 100 Wrist - APB 7   15.1     Upper arm APB 11.4  4.3  95 Upper arm - Wrist 22 42 ?49 13.5  R Ulnar - ADM     Wrist ADM 4.5 ?3.3 5.6 ?6.0 100 Wrist - ADM 7   18.6     B.Elbow ADM 6.6  6.7  119 B.Elbow - Wrist 14 67 ?49 19.2     A.Elbow ADM 9.8  6.1  90.7 A.Elbow - B.Elbow 14 44 ?49 18.5  L Ulnar - ADM     Wrist ADM 3.4 ?3.3 7.6 ?6.0 100 Wrist - ADM 7   24.3     B.Elbow ADM 6.7  7.8  102 B.Elbow - Wrist 14 42 ?49 25.3     A.Elbow ADM 9.8  7.5  96.7 A.Elbow - B.Elbow 16 52 ?49 24.0  R Peroneal - EDB  Ankle EDB NR ?6.5 NR ?2.0 NR Ankle - EDB 9   NR     Fib head EDB NR  NR  NR Fib head - Ankle   ?44 NR     Pop fossa EDB NR  NR  NR Pop fossa - Fib head   ?44 NR         Pop fossa - Ankle      R Tibial - AH     Ankle AH NR ?5.8 NR ?4.0 NR Ankle - AH 9   NR     Pop fossa AH      Pop fossa - Ankle   ?42                  SNC    Nerve / Sites Rec. Site Peak Lat Ref.  Amp Ref. Segments Distance    ms ms V V  cm  R Radial - Anatomical snuff box (Forearm)     Forearm Wrist 3.0 ?2.9 4 ?15 Forearm - Wrist 10  R Sural -  Ankle (Calf)     Calf Ankle NR ?4.4 NR ?6 Calf - Ankle 14  R Superficial peroneal - Ankle     Lat leg Ankle NR ?4.4 NR ?6 Lat leg - Ankle 14  R Median - Orthodromic (Dig II, Mid palm)     Dig II Wrist NR ?3.4 NR ?10 Dig II - Wrist 13  L Median - Orthodromic (Dig II, Mid palm)     Dig II Wrist NR ?3.4 NR ?10 Dig II - Wrist 13  R Ulnar - Orthodromic, (Dig V, Mid palm)     Dig V Wrist NR ?3.1 NR ?5 Dig V - Wrist 11  L Ulnar - Orthodromic, (Dig V, Mid palm)     Dig V Wrist NR ?3.1 NR ?5 Dig V - Wrist 40                   F  Wave    Nerve F Lat Ref.   ms ms  R Ulnar - ADM 35.5 ?32.0  L Ulnar - ADM 33.2 ?32.0         EMG Summary Table    Spontaneous MUAP Recruitment  Muscle IA Fib PSW Fasc Other Amp Dur. Poly Pattern  R. Tibialis posterior Increased 1+ None None _______ Increased Increased 1+ Reduced  R. Tibialis anterior Increased 1+ None None _______ Increased Increased 1+ Reduced  R. Peroneus longus Normal None None None _______ Normal Normal Normal Reduced  R. Gastrocnemius (Medial head) Normal None None None _______ Normal Normal Normal Reduced  R. Vastus lateralis Normal None None None _______ Normal Normal Normal Normal  L. Tibialis anterior Increased 1+ None None _______ Increased Increased 1+ Reduced  L. Tibialis posterior Increased None None None _______ Normal Normal Normal Reduced  L. Peroneus longus Increased None None None _______ Normal Normal Normal Reduced  L. Gastrocnemius (Medial head) Increased 1+ None None _______ Increased Increased 1+ Reduced  L. Vastus lateralis Normal None None None _______ Normal Normal Normal Reduced  R. Abductor hallucis Increased 1+ 1+ None _______ Increased Increased 1+ Reduced  R. Abductor pollicis brevis Normal None None None _______ Normal Normal Normal Reduced  L. Abductor pollicis brevis Normal None None None _______ Normal Normal Normal Reduced  R. Lumbar paraspinals (low) Normal None None None _______ Normal Normal Normal Normal  L.  Lumbar paraspinals (low) Normal None None None _______ Normal Normal Normal Normal  L. Lumbar  paraspinals (mid) Normal None None None _______ Normal Normal Normal Normal  R. Lumbar paraspinals (mid) Normal None None None _______ Normal Normal Normal Normal

## 2022-08-28 NOTE — Therapy (Addendum)
OUTPATIENT PHYSICAL THERAPY NEURO TREATMENT   Patient Name: Tim Spencer MRN: [id number] DOB:1961-04-30, 61 y.o., male Today's Date: 08/28/2022   PCP: Lin Landsman MD  REFERRING PROVIDER: Marcial Pacas, MD    PT End of Session - 08/28/22 1535     Visit Number 9    Number of Visits 17    Date for PT Re-Evaluation 09/20/22    Authorization Type BCBS    Authorization Time Period 07/26/22 to 09/20/22    Authorization - Number of Visits 16    PT Start Time 1534    PT Stop Time 1615    PT Time Calculation (min) 41 min    Equipment Utilized During Treatment Gait belt    Activity Tolerance Patient tolerated treatment well    Behavior During Therapy WFL for tasks assessed/performed                    Past Medical History:  Diagnosis Date   Anemia    CKD (chronic kidney disease) stage 3, GFR 30-59 ml/min (HCC)    Hypertension    Idiopathic progressive neuropathy    Secondary hyperparathyroidism, renal ([place])    Type 2 diabetes mellitus ([place])    Past Surgical History:  Procedure Laterality Date   GIVENS CAPSULE STUDY N/A 09/01/2020   Procedure: GIVENS CAPSULE STUDY;  Surgeon: Juanita Craver, MD;  Location: [facility] ENDOSCOPY;  Service: Endoscopy;  Laterality: N/A;   [place] SURGERY  06/05/2022   Patient Active Problem List   Diagnosis Date Noted   Diabetic peripheral neuropathy ([place]) 08/28/2022   Bilateral carpal tunnel syndrome 08/28/2022   Chronic heart failure with preserved ejection fraction ([place]) 08/01/2022   Stage 3b chronic kidney disease ([place]) 08/01/2022   Lumbar disc herniation with radiculopathy 06/05/2022   Dyslipidemia 06/01/2022   Spinal stenosis of lumbar region 03/07/2022   Gait abnormality 01/31/2022   Peripheral neuropathy 01/31/2022   Chronic left-sided low back pain with left-sided sciatica 01/31/2022   Abnormal stress test 08/27/2021   Heart failure ([place]) 08/14/2021   Supine hypertension 08/14/2021   Angina pectoris ([place]) 08/14/2021   Acute heart  failure with preserved ejection fraction (HFpEF) (HCC)    Elevated troponin    Left carotid bruit    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)    NSTEMI (non-ST elevated myocardial infarction) ([place]) 07/21/2021   AKI (acute kidney injury) ([place])    Bilateral lower extremity edema    Anemia    Hypertensive urgency 06/28/2021   Chronic kidney disease, stage 3a ([place])    Insulin-requiring or dependent type II diabetes mellitus ([place])    Hypothyroidism    Hyponatremia 07/05/2020   Orthostatic hypotension 06/06/2020   Bruit of right carotid artery 06/06/2020    ONSET DATE: 07/16/2022   REFERRING DIAG: M48.061 (ICD-10-CM) - Spinal stenosis of lumbar region, unspecified whether neurogenic claudication present G62.89 (ICD-10-CM) - Other polyneuropathy R26.9 (ICD-10-CM) - Gait abnormality   THERAPY DIAG:  Unsteadiness on feet  Other abnormalities of gait and mobility  Muscle weakness (generalized)  Pain in both lower extremities  Rationale for Evaluation and Treatment: Rehabilitation  SUBJECTIVE:  SUBJECTIVE STATEMENT: Pt reports ongoing neuropathic pain in his BLE, ranges from 5/10 to 10/10, remains constant. Pt reports he has been doing his HEP and seated cycler x 1 hour at slow speed. Pt also reports he has been trying to walk for about 45 min each day but sometimes has to break it up into 10 min increments due to getting fatigued.  Pt accompanied by: significant other  PERTINENT HISTORY: ASSESSMENT AND PLAN   Tim Spencer is a 61 y.o. male     Severe lumbar stenosis at L4-5, Bilateral hands paresthesia Gait abnormalities             Status post decompression surgery on June 05, 2022 recovered well,             Still have residual right low back pain,  radiating pain to right lower extremity, bilateral lower extremity neuropathic pain, responding well to Cymbalta, but has GI side effect, will slow titrating 20 mg / 40 mg daily             EMG nerve conduction study, for evaluation of superimposed peripheral neuropathy, bilateral upper extremity focal neuropathy             Referred to physical therapy             Bilateral AFO     DIAGNOSTIC DATA (LABS, IMAGING, TESTING) - I reviewed patient records, labs, notes, testing and imaging myself where available.   Laboratory evaluations in February 2023, elevated TSH 6.720, normal free T4, triglyceride was elevated 299, LDL was 94 glucose was 227, creatinine was 3, A1c was 7.0, hemoglobin was 12.2,   MEDICAL HISTORY:   Tim Spencer, is a 61 year old male, seen in request by his primary care physician Dr. Ayesha Rumpf, [place], companied by his wife for evaluation of worsening bilateral lower extremity paresthesia, unsteady gait, fall,   I reviewed and summarized the referring note. PMHX. HLD HTN DM 21 years CKD   Patient has diabetes for more than 20 years, around 2010, he began to notice bilateral feet numbness tingling, gradually getting worse over the past many years, now extending to knee level, also developed variable degree are sharp pain involving his lower extremity, needle prick sensation, burning, difficulty bearing weight,  He began to use cane few years back, now reliant on walker for longer distance since 2020  He also reported a history of lumbar minimally invasive procedure for low back pain left lumbar radicular pain in the past, now presenting with worsening lower extremity pain, radiating pain to left hip,  He also diagnosed with orthostatic hypotension, is on 3 agents for blood pressure control, this including Coreg 25 mg twice a day, clonidine 0.2 mg half tablets every day, isosorbide/hydrochlorothiazide 1 tablet 3 times a day,  He complains of declining functional status over  the past few years, rely on his cane and walker for longer distance, transient sharp pain of bilateral lower extremity, despite uptitrating dose of gabapentin up to 300 mg 3 times a day, not a good candidate for higher dose due to chronic kidney disease,  His neuropathy symptoms continue to progress, now began to involving bilateral upper extremities since 2023, he denies bowel and bladder incontinence, is able to transfer himself in and out of wheelchair PAIN:  Are you having pain? Yes: NPRS scale: 10/10 Pain location: LLE leg, R LE 5/10  Pain description: sciatic pain is better, current pain is not sciatic- like a muscle pain  Aggravating factors: standing, sitting on  toilet with something hard pressing on this  Relieving factors: some medications   PRECAUTIONS: Fall and Other: BLT as tolerated per surgical nurse case manager , watch BPs   WEIGHT BEARING RESTRICTIONS: No  FALLS: Has patient fallen in last 6 months? No  LIVING ENVIRONMENT: Lives with: lives with their family Lives in: House/apartment Stairs: ground level but has 2 steps to go into living room; has basement and second floor of home  Has following equipment at home: Walker - 4 wheeled, [place]   PLOF: Independent, Independent with basic ADLs, Independent with gait, and Independent with transfers  PATIENT GOALS: be able to get through daily routine easier and safer, try to reduce pain if able   OBJECTIVE:   DIAGNOSTIC FINDINGS: CLINICAL DATA:  Bilateral leg pains. History of herniated disc repair   EXAM: LUMBAR SPINE - 2-3 VIEW   COMPARISON:  Lumbar spine MRI 03/06/2022   FINDINGS: No evidence of acute fracture. Mild loss of disc height at L4-L5 and mild-moderate loss of disc height at L5-S1. Moderate lumbar facet arthropathy at L4-S1. Aortic atherosclerotic calcification.   IMPRESSION: No acute fracture. Degenerative changes at L4-S1 where there is mild-moderate disc space height loss and facet arthropathy.      TODAY'S TREATMENT:                                                                                                                              DATE:   THER ACT: In // bars Foam beam step-overs with progression from BUE support to no UE support with min A needed 2 x 10 reps with each LE, focus on increasing hip and knee flexion to increase step height and step length  Blaze pods random 5 dot configuration In // bars Static stance alt LE dot taps with no UE support and min A for balance Pt scores 6, 12, and 6 taps Sidesteps with alt L/R dot taps with BUE support and SBA Pt scores 4, 5, and 10 taps   Vitals:   08/28/22 1538  BP: 127/66  Pulse: 60    GAIT: Gait pattern:  B shoulder elevation, decreased hip/knee flexion- Right, and decreased hip/knee flexion- Left Distance walked: 345 ft Assistive device utilized: Walker - 4 wheeled Level of assistance: Modified independence Comments: see deviations above   PATIENT EDUCATION: Education details: continue HEP Person educated: Patient and Spouse Education method: Customer service manager Education comprehension: verbalized understanding and needs further education  HOME EXERCISE PROGRAM: Access Code: [email or web address] URL: [email or web address] Date: 08/28/2022 Prepared by: Excell Seltzer  Exercises - Standing Single Leg Stance with Counter Support  - 2 x daily - 5 x weekly - 3 sets - 10-15 hold - Sit to Stand  - 2 x daily - 5 x weekly - 1 sets - 10 reps - Standing Hip Abduction with Counter Support  - 2 x daily - 5 x weekly - 1-2 sets - 10 reps - Standing 3-way Hip with Gilford Rile  -  1 x daily - 7 x weekly - 1 sets - 10 reps - Side Stepping with Counter Support  - 1 x daily - 7 x weekly - 1 sets - 5 reps  GOALS: Goals reviewed with patient? No  SHORT TERM GOALS: Target date: 08/23/2022  Will be compliant with appropriate progressive HEP  Baseline: Goal status: MET 11/30 compliant with daily HEP   2.   Will be able to maintain tandem stance for 15 seconds with no more than min guard B Baseline:  Goal status: IN PROGRESS 11/30- min guard to occasional MinA   3.  Will be able to demonstrate correct technique for skin checks/wear schedule for new AFOs after he has them  Baseline:  Goal status: IN PROGRESS 11/30- doesn't see [facility] until 12/20, trying to get in sooner via wait list   4.  Will score at least 34/56 on [place]  Baseline:  Goal status: MET11/30- 37    LONG TERM GOALS: Target date: 09/20/2022  MMT to improve by at least 1 grade in all weak groups  Baseline:  Goal status: IN PROGRESS  2.  Pain to be no more than 7/10 at worst  Baseline:  Goal status: IN PROGRESS11/30- can get to higher than 7/10 when sleeping or on days he walks more   3.  Will score at least 38/56 on Berg  Baseline:  Goal status: IN PROGRESS 11/30-37   4.  Will be able to ambulate household distances with no device and no unsteadiness, and community distances with LRAD with minimal fall risk  Baseline:  Goal status: IN PROGRESS 11/30- DNT   5.  Will be able to complete floor to stand transfers with no more than min guard assist  Baseline:  Goal status: IN PROGRESS 11/30- DNT     ASSESSMENT:  CLINICAL IMPRESSION: Emphasis of skilled PT session on assessing gait to determine gait deviations, working on increasing hip/knee flexion during gait, and working on balance with SLS via use of blaze pods. Pt exhibits decreased balance in SLS especially without use of UE support and needs up to min A to recover balance and prevent a fall. Additionally, pt has some dizziness following standing activity that does resolve with a seated rest break. Pt's vitals WNL during session, see above. Pt continues to benefit from skilled therapy services to address ongoing gait and balance impairments due to BLE weakness and neuropathic pain. Continue POC.   OBJECTIVE IMPAIRMENTS: Abnormal gait, decreased activity tolerance,  decreased balance, decreased coordination, decreased knowledge of use of DME, difficulty walking, decreased strength, impaired sensation, and pain.   ACTIVITY LIMITATIONS: standing, squatting, stairs, transfers, and locomotion level  PARTICIPATION LIMITATIONS: driving, shopping, community activity, and yard work  PERSONAL FACTORS: Age, Behavior pattern, Fitness, Past/current experiences, and Time since onset of injury/illness/exacerbation are also affecting patient's functional outcome.   REHAB POTENTIAL: Fair complex medical history and multiple chronic health issues contributing to complaints   CLINICAL DECISION MAKING: Evolving/moderate complexity  EVALUATION COMPLEXITY: Moderate  PLAN:  PT FREQUENCY: 2x/week  PT DURATION: 8 weeks  PLANNED INTERVENTIONS: Therapeutic exercises, Therapeutic activity, Neuromuscular re-education, Balance training, Gait training, Patient/Family education, Self Care, Joint mobilization, Stair training, Orthotic/Fit training, DME instructions, Taping, Manual therapy, and Re-evaluation  PLAN FOR NEXT SESSION:  BP  goal 150/80 per pt; watch BPs, orthostatic (but can run super high too) would focus on the functional and balance/strength/fall prevention. Add some any other balance to HEP. Has appointment at [facility]/20 but trying to move it  up   Excell Seltzer, PT, DPT, CSRS

## 2022-08-30 ENCOUNTER — Ambulatory Visit: Payer: BC Managed Care – PPO | Admitting: Physical Therapy

## 2022-08-30 NOTE — Therapy (Incomplete)
OUTPATIENT PHYSICAL THERAPY NEURO TREATMENT-10TH VISIT PROGRESS NOTE   Patient Name: Tim Spencer MRN: [id number] DOB:1961-04-19, 61 y.o., male Today's Date: 08/30/2022   PCP: Lin Landsman MD  REFERRING PROVIDER: Marcial Pacas, MD   Physical Therapy Progress Note   Dates of Reporting Period:07/26/2022 - 08/30/2022  See Note below for Objective Data and Assessment of Progress/Goals.  Thank you for the referral of this patient. Excell Seltzer, PT, DPT, CSRS             Past Medical History:  Diagnosis Date   Anemia    CKD (chronic kidney disease) stage 3, GFR 30-59 ml/min (HCC)    Hypertension    Idiopathic progressive neuropathy    Secondary hyperparathyroidism, renal ([place])    Type 2 diabetes mellitus ([place])    Past Surgical History:  Procedure Laterality Date   GIVENS CAPSULE STUDY N/A 09/01/2020   Procedure: GIVENS CAPSULE STUDY;  Surgeon: Juanita Craver, MD;  Location: [facility] ENDOSCOPY;  Service: Endoscopy;  Laterality: N/A;   [place] SURGERY  06/05/2022   Patient Active Problem List   Diagnosis Date Noted   Diabetic peripheral neuropathy ([place]) 08/28/2022   Bilateral carpal tunnel syndrome 08/28/2022   Chronic heart failure with preserved ejection fraction ([place]) 08/01/2022   Stage 3b chronic kidney disease ([place]) 08/01/2022   Lumbar disc herniation with radiculopathy 06/05/2022   Dyslipidemia 06/01/2022   Spinal stenosis of lumbar region 03/07/2022   Gait abnormality 01/31/2022   Peripheral neuropathy 01/31/2022   Chronic left-sided low back pain with left-sided sciatica 01/31/2022   Abnormal stress test 08/27/2021   Heart failure ([place]) 08/14/2021   Supine hypertension 08/14/2021   Angina pectoris ([place]) 08/14/2021   Acute heart failure with preserved ejection fraction (HFpEF) (HCC)    Elevated troponin    Left carotid bruit    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)    NSTEMI (non-ST elevated myocardial infarction) ([place])  07/21/2021   AKI (acute kidney injury) ([place])    Bilateral lower extremity edema    Anemia    Hypertensive urgency 06/28/2021   Chronic kidney disease, stage 3a ([place])    Insulin-requiring or dependent type II diabetes mellitus ([place])    Hypothyroidism    Hyponatremia 07/05/2020   Orthostatic hypotension 06/06/2020   Bruit of right carotid artery 06/06/2020    ONSET DATE: 07/16/2022   REFERRING DIAG: M48.061 (ICD-10-CM) - Spinal stenosis of lumbar region, unspecified whether neurogenic claudication present G62.89 (ICD-10-CM) - Other polyneuropathy R26.9 (ICD-10-CM) - Gait abnormality   THERAPY DIAG:  No diagnosis found.  Rationale for Evaluation and Treatment: Rehabilitation  SUBJECTIVE:  SUBJECTIVE STATEMENT: ***  Pt accompanied by: significant other  PERTINENT HISTORY: ASSESSMENT AND PLAN   Tim Spencer is a 61 y.o. male     Severe lumbar stenosis at L4-5, Bilateral hands paresthesia Gait abnormalities             Status post decompression surgery on June 05, 2022 recovered well,             Still have residual right low back pain, radiating pain to right lower extremity, bilateral lower extremity neuropathic pain, responding well to Cymbalta, but has GI side effect, will slow titrating 20 mg / 40 mg daily             EMG nerve conduction study, for evaluation of superimposed peripheral neuropathy, bilateral upper extremity focal neuropathy             Referred to physical therapy             Bilateral AFO     DIAGNOSTIC DATA (LABS, IMAGING, TESTING) - I reviewed patient records, labs, notes, testing and imaging myself where available.   Laboratory evaluations in February 2023, elevated TSH 6.720, normal free T4, triglyceride was elevated 299, LDL was 94 glucose was 227, creatinine  was 3, A1c was 7.0, hemoglobin was 12.2,   MEDICAL HISTORY:   Tim Spencer, is a 61 year old male, seen in request by his primary care physician Dr. Ayesha Rumpf, [place], companied by his wife for evaluation of worsening bilateral lower extremity paresthesia, unsteady gait, fall,   I reviewed and summarized the referring note. PMHX. HLD HTN DM 21 years CKD   Patient has diabetes for more than 20 years, around 2010, he began to notice bilateral feet numbness tingling, gradually getting worse over the past many years, now extending to knee level, also developed variable degree are sharp pain involving his lower extremity, needle prick sensation, burning, difficulty bearing weight,  He began to use cane few years back, now reliant on walker for longer distance since 2020  He also reported a history of lumbar minimally invasive procedure for low back pain left lumbar radicular pain in the past, now presenting with worsening lower extremity pain, radiating pain to left hip,  He also diagnosed with orthostatic hypotension, is on 3 agents for blood pressure control, this including Coreg 25 mg twice a day, clonidine 0.2 mg half tablets every day, isosorbide/hydrochlorothiazide 1 tablet 3 times a day,  He complains of declining functional status over the past few years, rely on his cane and walker for longer distance, transient sharp pain of bilateral lower extremity, despite uptitrating dose of gabapentin up to 300 mg 3 times a day, not a good candidate for higher dose due to chronic kidney disease,  His neuropathy symptoms continue to progress, now began to involving bilateral upper extremities since 2023, he denies bowel and bladder incontinence, is able to transfer himself in and out of wheelchair PAIN:  Are you having pain? Yes: NPRS scale: 10/10 Pain location: LLE leg, R LE 5/10  Pain description: sciatic pain is better, current pain is not sciatic- like a muscle pain  Aggravating factors:  standing, sitting on toilet with something hard pressing on this  Relieving factors: some medications   PRECAUTIONS: Fall and Other: BLT as tolerated per surgical nurse case manager , watch BPs   WEIGHT BEARING RESTRICTIONS: No  FALLS: Has patient fallen in last 6 months? No  LIVING ENVIRONMENT: Lives with: lives with their family Lives in: House/apartment Stairs: ground level but  has 2 steps to go into living room; has basement and second floor of home  Has following equipment at home: Walker - 4 wheeled, [place]   PLOF: Independent, [place] with basic ADLs, Independent with gait, and Independent with transfers  PATIENT GOALS: be able to get through daily routine easier and safer, try to reduce pain if able   OBJECTIVE:   DIAGNOSTIC FINDINGS: CLINICAL DATA:  Bilateral leg pains. History of herniated disc repair   EXAM: LUMBAR SPINE - 2-3 VIEW   COMPARISON:  Lumbar spine MRI 03/06/2022   FINDINGS: No evidence of acute fracture. Mild loss of disc height at L4-L5 and mild-moderate loss of disc height at L5-S1. Moderate lumbar facet arthropathy at L4-S1. Aortic atherosclerotic calcification.   IMPRESSION: No acute fracture. Degenerative changes at L4-S1 where there is mild-moderate disc space height loss and facet arthropathy.     TODAY'S TREATMENT:                                                                                                                              DATE:   THER ACT: ***   There were no vitals filed for this visit.   GAIT: Gait pattern:  B shoulder elevation, decreased hip/knee flexion- Right, and decreased hip/knee flexion- Left Distance walked: 345 ft Assistive device utilized: Walker - 4 wheeled Level of assistance: Modified independence Comments: see deviations above   PATIENT EDUCATION: Education details: continue HEP*** Person educated: Patient and Spouse Education method: Customer service manager Education comprehension:  verbalized understanding and needs further education  HOME EXERCISE PROGRAM: Access Code: [email or web address] URL: [email or web address] Date: 08/28/2022 Prepared by: Excell Seltzer  Exercises - Standing Single Leg Stance with Counter Support  - 2 x daily - 5 x weekly - 3 sets - 10-15 hold - Sit to Stand  - 2 x daily - 5 x weekly - 1 sets - 10 reps - Standing Hip Abduction with Counter Support  - 2 x daily - 5 x weekly - 1-2 sets - 10 reps - Standing 3-way Hip with Walker  - 1 x daily - 7 x weekly - 1 sets - 10 reps - Side Stepping with Counter Support  - 1 x daily - 7 x weekly - 1 sets - 5 reps  GOALS: Goals reviewed with patient? No  SHORT TERM GOALS: Target date: 08/23/2022  Will be compliant with appropriate progressive HEP  Baseline: Goal status: MET 11/30 compliant with daily HEP   2.  Will be able to maintain tandem stance for 15 seconds with no more than min guard B Baseline:  Goal status: IN PROGRESS 11/30- min guard to occasional MinA   3.  Will be able to demonstrate correct technique for skin checks/wear schedule for new AFOs after he has them  Baseline:  Goal status: IN PROGRESS 11/30- doesn't see [facility] until 12/20, trying to get in sooner via wait list   4.  Will score at least 34/56 on  Berg  Baseline:  Goal status: MET11/30- 37    LONG TERM GOALS: Target date: 09/20/2022  MMT to improve by at least 1 grade in all weak groups  Baseline:  Goal status: IN PROGRESS  2.  Pain to be no more than 7/10 at worst  Baseline:  Goal status: IN PROGRESS11/30- can get to higher than 7/10 when sleeping or on days he walks more   3.  Will score at least 38/56 on Berg  Baseline:  Goal status: IN PROGRESS 11/30-37   4.  Will be able to ambulate household distances with no device and no unsteadiness, and community distances with LRAD with minimal fall risk  Baseline:  Goal status: IN PROGRESS 11/30- DNT   5.  Will be able to complete floor to stand transfers  with no more than min guard assist  Baseline:  Goal status: IN PROGRESS 11/30- DNT     ASSESSMENT:  CLINICAL IMPRESSION: Emphasis of skilled PT session on *** Continue POC.   OBJECTIVE IMPAIRMENTS: Abnormal gait, decreased activity tolerance, decreased balance, decreased coordination, decreased knowledge of use of DME, difficulty walking, decreased strength, impaired sensation, and pain.   ACTIVITY LIMITATIONS: standing, squatting, stairs, transfers, and locomotion level  PARTICIPATION LIMITATIONS: driving, shopping, community activity, and yard work  PERSONAL FACTORS: Age, Behavior pattern, Fitness, Past/current experiences, and Time since onset of injury/illness/exacerbation are also affecting patient's functional outcome.   REHAB POTENTIAL: Fair complex medical history and multiple chronic health issues contributing to complaints   CLINICAL DECISION MAKING: Evolving/moderate complexity  EVALUATION COMPLEXITY: Moderate  PLAN:  PT FREQUENCY: 2x/week  PT DURATION: 8 weeks  PLANNED INTERVENTIONS: Therapeutic exercises, Therapeutic activity, Neuromuscular re-education, Balance training, Gait training, Patient/Family education, Self Care, Joint mobilization, Stair training, Orthotic/Fit training, DME instructions, Taping, Manual therapy, and Re-evaluation  PLAN FOR NEXT SESSION:  BP  goal 150/80 per pt; watch BPs, orthostatic (but can run super high too) would focus on the functional and balance/strength/fall prevention. Add some any other balance to HEP. Has appointment at [facility] 12/20 but trying to move it up***   Excell Seltzer, PT, DPT, CSRS

## 2022-09-03 ENCOUNTER — Other Ambulatory Visit: Payer: Self-pay | Admitting: Cardiology

## 2022-09-04 ENCOUNTER — Ambulatory Visit: Payer: BC Managed Care – PPO | Admitting: Physical Therapy

## 2022-09-04 ENCOUNTER — Encounter: Payer: Self-pay | Admitting: Physical Therapy

## 2022-09-04 VITALS — BP 152/63 | HR 64

## 2022-09-04 DIAGNOSIS — R2689 Other abnormalities of gait and mobility: Secondary | ICD-10-CM

## 2022-09-04 DIAGNOSIS — R2681 Unsteadiness on feet: Secondary | ICD-10-CM | POA: Diagnosis not present

## 2022-09-04 DIAGNOSIS — M6281 Muscle weakness (generalized): Secondary | ICD-10-CM

## 2022-09-04 NOTE — Therapy (Signed)
OUTPATIENT PHYSICAL THERAPY NEURO TREATMENT   Patient Name: Tim Spencer MRN: [id number] DOB:1960/11/02, 61 y.o., male Today's Date: 09/04/2022   PCP: Lin Landsman MD  REFERRING PROVIDER: Marcial Pacas, MD    PT End of Session - 09/04/22 1452     Visit Number 10    Number of Visits 17    Date for PT Re-Evaluation 09/20/22    Authorization Type BCBS    Authorization Time Period 07/26/22 to 09/20/22    Authorization - Number of Visits 16    PT Start Time 1450    PT Stop Time 1530    PT Time Calculation (min) 40 min    Equipment Utilized During Treatment Gait belt    Activity Tolerance Patient tolerated treatment well    Behavior During Therapy WFL for tasks assessed/performed                    Past Medical History:  Diagnosis Date   Anemia    CKD (chronic kidney disease) stage 3, GFR 30-59 ml/min (HCC)    Hypertension    Idiopathic progressive neuropathy    Secondary hyperparathyroidism, renal ([place])    Type 2 diabetes mellitus ([place])    Past Surgical History:  Procedure Laterality Date   GIVENS CAPSULE STUDY N/A 09/01/2020   Procedure: GIVENS CAPSULE STUDY;  Surgeon: Juanita Craver, MD;  Location: [facility] ENDOSCOPY;  Service: Endoscopy;  Laterality: N/A;   [place] SURGERY  06/05/2022   Patient Active Problem List   Diagnosis Date Noted   Diabetic peripheral neuropathy ([place]) 08/28/2022   Bilateral carpal tunnel syndrome 08/28/2022   Chronic heart failure with preserved ejection fraction ([place]) 08/01/2022   Stage 3b chronic kidney disease ([place]) 08/01/2022   Lumbar disc herniation with radiculopathy 06/05/2022   Dyslipidemia 06/01/2022   Spinal stenosis of lumbar region 03/07/2022   Gait abnormality 01/31/2022   Peripheral neuropathy 01/31/2022   Chronic left-sided low back pain with left-sided sciatica 01/31/2022   Abnormal stress test 08/27/2021   Heart failure ([place]) 08/14/2021   Supine hypertension 08/14/2021   Angina pectoris ([place]) 08/14/2021   Acute heart  failure with preserved ejection fraction (HFpEF) (HCC)    Elevated troponin    Left carotid bruit    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)    NSTEMI (non-ST elevated myocardial infarction) ([place]) 07/21/2021   AKI (acute kidney injury) ([place])    Bilateral lower extremity edema    Anemia    Hypertensive urgency 06/28/2021   Chronic kidney disease, stage 3a ([place])    Insulin-requiring or dependent type II diabetes mellitus ([place])    Hypothyroidism    Hyponatremia 07/05/2020   Orthostatic hypotension 06/06/2020   Bruit of right carotid artery 06/06/2020    ONSET DATE: 07/16/2022   REFERRING DIAG: M48.061 (ICD-10-CM) - Spinal stenosis of lumbar region, unspecified whether neurogenic claudication present G62.89 (ICD-10-CM) - Other polyneuropathy R26.9 (ICD-10-CM) - Gait abnormality   THERAPY DIAG:  Unsteadiness on feet  Other abnormalities of gait and mobility  Muscle weakness (generalized)  Rationale for Evaluation and Treatment: Rehabilitation  SUBJECTIVE:  SUBJECTIVE STATEMENT: Had to cancel his previous appt due to a high BP. No falls. Exercises are going well at home. Has appt with [facility] on the 20th.   Pt accompanied by: significant other  PERTINENT HISTORY: ASSESSMENT AND PLAN   Tim Spencer is a 61 y.o. male     Severe lumbar stenosis at L4-5, Bilateral hands paresthesia Gait abnormalities             Status post decompression surgery on June 05, 2022 recovered well,             Still have residual right low back pain, radiating pain to right lower extremity, bilateral lower extremity neuropathic pain, responding well to Cymbalta, but has GI side effect, will slow titrating 20 mg / 40 mg daily             EMG nerve conduction study, for  evaluation of superimposed peripheral neuropathy, bilateral upper extremity focal neuropathy             Referred to physical therapy             Bilateral AFO     DIAGNOSTIC DATA (LABS, IMAGING, TESTING) - I reviewed patient records, labs, notes, testing and imaging myself where available.   Laboratory evaluations in February 2023, elevated TSH 6.720, normal free T4, triglyceride was elevated 299, LDL was 94 glucose was 227, creatinine was 3, A1c was 7.0, hemoglobin was 12.2,   MEDICAL HISTORY:   Tim Spencer, is a 61 year old male, seen in request by his primary care physician Dr. Ayesha Rumpf, [place], companied by his wife for evaluation of worsening bilateral lower extremity paresthesia, unsteady gait, fall,   I reviewed and summarized the referring note. PMHX. HLD HTN DM 21 years CKD   Patient has diabetes for more than 20 years, around 2010, he began to notice bilateral feet numbness tingling, gradually getting worse over the past many years, now extending to knee level, also developed variable degree are sharp pain involving his lower extremity, needle prick sensation, burning, difficulty bearing weight,  He began to use cane few years back, now reliant on walker for longer distance since 2020  He also reported a history of lumbar minimally invasive procedure for low back pain left lumbar radicular pain in the past, now presenting with worsening lower extremity pain, radiating pain to left hip,  He also diagnosed with orthostatic hypotension, is on 3 agents for blood pressure control, this including Coreg 25 mg twice a day, clonidine 0.2 mg half tablets every day, isosorbide/hydrochlorothiazide 1 tablet 3 times a day,  He complains of declining functional status over the past few years, rely on his cane and walker for longer distance, transient sharp pain of bilateral lower extremity, despite uptitrating dose of gabapentin up to 300 mg 3 times a day, not a good candidate for higher dose  due to chronic kidney disease,  His neuropathy symptoms continue to progress, now began to involving bilateral upper extremities since 2023, he denies bowel and bladder incontinence, is able to transfer himself in and out of wheelchair PAIN:  Are you having pain? Yes: NPRS scale: 5-8/10 Pain location: 8/10 LLE leg, 5/6 R LE 5/10  Pain description: sciatic pain is better, current pain is not sciatic- like a muscle pain. "Reports pain all the time"   Aggravating factors: standing, walking more Relieving factors: some medications   Vitals:   09/04/22 1456  BP: (!) 152/63  Pulse: 64     PRECAUTIONS: Fall and Other: BLT  as tolerated per surgical nurse case manager , watch BPs   WEIGHT BEARING RESTRICTIONS: No  FALLS: Has patient fallen in last 6 months? No  LIVING ENVIRONMENT: Lives with: lives with their family Lives in: House/apartment Stairs: ground level but has 2 steps to go into living room; has basement and second floor of home  Has following equipment at home: Walker - 4 wheeled, [place]   PLOF: Independent, Independent with basic ADLs, Independent with gait, and Independent with transfers  PATIENT GOALS: be able to get through daily routine easier and safer, try to reduce pain if able   OBJECTIVE:   DIAGNOSTIC FINDINGS: CLINICAL DATA:  Bilateral leg pains. History of herniated disc repair   EXAM: LUMBAR SPINE - 2-3 VIEW   COMPARISON:  Lumbar spine MRI 03/06/2022   FINDINGS: No evidence of acute fracture. Mild loss of disc height at L4-L5 and mild-moderate loss of disc height at L5-S1. Moderate lumbar facet arthropathy at L4-S1. Aortic atherosclerotic calcification.   IMPRESSION: No acute fracture. Degenerative changes at L4-S1 where there is mild-moderate disc space height loss and facet arthropathy.     TODAY'S TREATMENT:                                                                                                                              DATE:   NMR:   In // bars On blue foam beam -Side stepping down and back x3 reps, trying to perform without UE support, cues for incr foot clearance -Alternating forward stepping strategy x10 reps each side  -Alternating posterior stepping strategy x10 reps each side  Pt needing intermittent UE support for balance  On rockerboard in A/P direction: -Weight shifting x15 reps, pt more difficulty shifting posteriorly and frequently losing balance  -Head turns 2 sets of 10 reps  -Ball toss with 2nd PT 15 reps, with one PT providing CGA/min A, pt able to use ankle strategy to maintain balance  On blue air ex:  -Alternating SLS taps to 6" step x10 reps each side, trying to perform without UE support   PT providing min guard for all balance activities standing behind pt.  Vitals:   09/04/22 1456  BP: (!) 152/63  Pulse: 64     GAIT: Gait pattern:  B shoulder elevation, decreased hip/knee flexion- Right, and decreased hip/knee flexion- Left Distance walked: Clinic distances Assistive device utilized: Environmental consultant - 4 wheeled Level of assistance: Modified independence Comments: see deviations above   PATIENT EDUCATION: Education details: continue HEP Person educated: Patient and Spouse Education method: Customer service manager Education comprehension: verbalized understanding and needs further education  HOME EXERCISE PROGRAM: Access Code: [email or web address] URL: [email or web address] Date: 08/28/2022 Prepared by: Excell Seltzer  Exercises - Standing Single Leg Stance with Counter Support  - 2 x daily - 5 x weekly - 3 sets - 10-15 hold - Sit to Stand  - 2 x daily - 5 x weekly - 1 sets - 10 reps -  Standing Hip Abduction with Counter Support  - 2 x daily - 5 x weekly - 1-2 sets - 10 reps - Standing 3-way Hip with Walker  - 1 x daily - 7 x weekly - 1 sets - 10 reps - Side Stepping with Counter Support  - 1 x daily - 7 x weekly - 1 sets - 5 reps  GOALS: Goals reviewed with patient?  No  SHORT TERM GOALS: Target date: 08/23/2022  Will be compliant with appropriate progressive HEP  Baseline: Goal status: MET 11/30 compliant with daily HEP   2.  Will be able to maintain tandem stance for 15 seconds with no more than min guard B Baseline:  Goal status: IN PROGRESS 11/30- min guard to occasional MinA   3.  Will be able to demonstrate correct technique for skin checks/wear schedule for new AFOs after he has them  Baseline:  Goal status: IN PROGRESS 11/30- doesn't see [facility] until 12/20, trying to get in sooner via wait list   4.  Will score at least 34/56 on Berg  Baseline:  Goal status: MET11/30- 37    LONG TERM GOALS: Target date: 09/20/2022  MMT to improve by at least 1 grade in all weak groups  Baseline:  Goal status: IN PROGRESS  2.  Pain to be no more than 7/10 at worst  Baseline:  Goal status: IN PROGRESS11/30- can get to higher than 7/10 when sleeping or on days he walks more   3.  Will score at least 38/56 on Berg  Baseline:  Goal status: IN PROGRESS 11/30-37   4.  Will be able to ambulate household distances with no device and no unsteadiness, and community distances with LRAD with minimal fall risk  Baseline:  Goal status: IN PROGRESS 11/30- DNT   5.  Will be able to complete floor to stand transfers with no more than min guard assist  Baseline:  Goal status: IN PROGRESS 11/30- DNT     ASSESSMENT:  CLINICAL IMPRESSION: Today's skilled session continued to focus on standing balance strategies on compliant surfaces with weight shifting, stepping, SLS, and ankle strategy. Pt challenged by rockerboard and had tendency to lose balance posteriorly at times. Min guard/min A as needed for balance. Pt needing intermittent seated rest breaks during session due to fatigue. Will continue per POC.    OBJECTIVE IMPAIRMENTS: Abnormal gait, decreased activity tolerance, decreased balance, decreased coordination, decreased knowledge of use of DME,  difficulty walking, decreased strength, impaired sensation, and pain.   ACTIVITY LIMITATIONS: standing, squatting, stairs, transfers, and locomotion level  PARTICIPATION LIMITATIONS: driving, shopping, community activity, and yard work  PERSONAL FACTORS: Age, Behavior pattern, Fitness, Past/current experiences, and Time since onset of injury/illness/exacerbation are also affecting patient's functional outcome.   REHAB POTENTIAL: Fair complex medical history and multiple chronic health issues contributing to complaints   CLINICAL DECISION MAKING: Evolving/moderate complexity  EVALUATION COMPLEXITY: Moderate  PLAN:  PT FREQUENCY: 2x/week  PT DURATION: 8 weeks  PLANNED INTERVENTIONS: Therapeutic exercises, Therapeutic activity, Neuromuscular re-education, Balance training, Gait training, Patient/Family education, Self Care, Joint mobilization, Stair training, Orthotic/Fit training, DME instructions, Taping, Manual therapy, and Re-evaluation  PLAN FOR NEXT SESSION:  BP  goal 150/80 per pt; watch BPs, orthostatic (but can run super high too) would focus on the functional and balance/strength/fall prevention. Add some any other balance to HEP. Has appointment at [facility] 12/20 but trying to move it up   Janann August, PT, DPT 09/04/22 3:43 PM

## 2022-09-05 ENCOUNTER — Telehealth: Payer: Self-pay

## 2022-09-05 NOTE — Telephone Encounter (Signed)
Pt aware.

## 2022-09-05 NOTE — Telephone Encounter (Signed)
Yes. Just nee dot monitor blood pressure as he starts it.  Thanks MJP

## 2022-09-05 NOTE — Telephone Encounter (Signed)
Patient called and said is it ok to take chlorthalidone '25mg'$ , his nephrologist added this med?

## 2022-09-06 ENCOUNTER — Ambulatory Visit: Payer: BC Managed Care – PPO | Admitting: Physical Therapy

## 2022-09-06 VITALS — BP 160/70 | HR 61

## 2022-09-06 DIAGNOSIS — M6281 Muscle weakness (generalized): Secondary | ICD-10-CM

## 2022-09-06 DIAGNOSIS — M79604 Pain in right leg: Secondary | ICD-10-CM

## 2022-09-06 DIAGNOSIS — R2689 Other abnormalities of gait and mobility: Secondary | ICD-10-CM

## 2022-09-06 DIAGNOSIS — R2681 Unsteadiness on feet: Secondary | ICD-10-CM

## 2022-09-06 NOTE — Therapy (Signed)
OUTPATIENT PHYSICAL THERAPY NEURO TREATMENT-10TH VISIT PROGRESS NOTE   Patient Name: Tim Spencer MRN: [id number] DOB:03-26-1961, 61 y.o., male Today's Date: 09/06/2022   PCP: Lin Landsman MD  REFERRING PROVIDER: Marcial Pacas, MD   Physical Therapy Progress Note   Dates of Reporting Period:07/26/22 - 09/06/22  See Note below for Objective Data and Assessment of Progress/Goals.  Thank you for the referral of this patient. Excell Seltzer, PT, DPT, CSRS    PT End of Session - 09/06/22 1534     Visit Number 11    Number of Visits 17    Date for PT Re-Evaluation 09/20/22    Authorization Type [employer]    Authorization Time Period 07/26/22 to 09/20/22    Authorization - Number of Visits 16    PT Start Time 1534    PT Stop Time 1616    PT Time Calculation (min) 42 min    Equipment Utilized During Treatment Gait belt    Activity Tolerance Patient tolerated treatment well    Behavior During Therapy WFL for tasks assessed/performed                     Past Medical History:  Diagnosis Date   Anemia    CKD (chronic kidney disease) stage 3, GFR 30-59 ml/min (HCC)    Hypertension    Idiopathic progressive neuropathy    Secondary hyperparathyroidism, renal ([place])    Type 2 diabetes mellitus ([place])    Past Surgical History:  Procedure Laterality Date   GIVENS CAPSULE STUDY N/A 09/01/2020   Procedure: GIVENS CAPSULE STUDY;  Surgeon: Juanita Craver, MD;  Location: [facility] ENDOSCOPY;  Service: Endoscopy;  Laterality: N/A;   [place] SURGERY  06/05/2022   Patient Active Problem List   Diagnosis Date Noted   Diabetic peripheral neuropathy ([place]) 08/28/2022   Bilateral carpal tunnel syndrome 08/28/2022   Chronic heart failure with preserved ejection fraction ([place]) 08/01/2022   Stage 3b chronic kidney disease ([place]) 08/01/2022   Lumbar disc herniation with radiculopathy 06/05/2022   Dyslipidemia 06/01/2022   Spinal stenosis of lumbar region 03/07/2022   Gait abnormality 01/31/2022    Peripheral neuropathy 01/31/2022   Chronic left-sided low back pain with left-sided sciatica 01/31/2022   Abnormal stress test 08/27/2021   Heart failure ([place]) 08/14/2021   Supine hypertension 08/14/2021   Angina pectoris ([place]) 08/14/2021   Acute heart failure with preserved ejection fraction (HFpEF) (HCC)    Elevated troponin    Left carotid bruit    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)    NSTEMI (non-ST elevated myocardial infarction) ([place]) 07/21/2021   AKI (acute kidney injury) ([place])    Bilateral lower extremity edema    Anemia    Hypertensive urgency 06/28/2021   Chronic kidney disease, stage 3a ([place])    Insulin-requiring or dependent type II diabetes mellitus ([place])    Hypothyroidism    Hyponatremia 07/05/2020   Orthostatic hypotension 06/06/2020   Bruit of right carotid artery 06/06/2020    ONSET DATE: 07/16/2022   REFERRING DIAG: M48.061 (ICD-10-CM) - Spinal stenosis of lumbar region, unspecified whether neurogenic claudication present G62.89 (ICD-10-CM) - Other polyneuropathy R26.9 (ICD-10-CM) - Gait abnormality   THERAPY DIAG:  Unsteadiness on feet  Other abnormalities of gait and mobility  Muscle weakness (generalized)  Pain in both lower extremities  Rationale for Evaluation and Treatment: Rehabilitation  SUBJECTIVE:  SUBJECTIVE STATEMENT: Pt reports some ongoing neuropathic pain, 8-9/10 in LLE and 7/10 in RLE. Pt reports he has been trying to do his HEP but doesn't do it on days his pain is worse. Cloudy and cold weather makes pain worse.  Pt accompanied by: significant other  PERTINENT HISTORY: ASSESSMENT AND PLAN   Tim Spencer is a 61 y.o. male     Severe lumbar stenosis at L4-5, Bilateral hands paresthesia Gait abnormalities              Status post decompression surgery on June 05, 2022 recovered well,             Still have residual right low back pain, radiating pain to right lower extremity, bilateral lower extremity neuropathic pain, responding well to Cymbalta, but has GI side effect, will slow titrating 20 mg / 40 mg daily             EMG nerve conduction study, for evaluation of superimposed peripheral neuropathy, bilateral upper extremity focal neuropathy             Referred to physical therapy             Bilateral AFO     DIAGNOSTIC DATA (LABS, IMAGING, TESTING) - I reviewed patient records, labs, notes, testing and imaging myself where available.   Laboratory evaluations in February 2023, elevated TSH 6.720, normal free T4, triglyceride was elevated 299, LDL was 94 glucose was 227, creatinine was 3, A1c was 7.0, hemoglobin was 12.2,   MEDICAL HISTORY:   Tim Spencer, is a 62 year old male, seen in request by his primary care physician Dr. Ayesha Rumpf, [place], companied by his wife for evaluation of worsening bilateral lower extremity paresthesia, unsteady gait, fall,   I reviewed and summarized the referring note. PMHX. HLD HTN DM 21 years CKD   Patient has diabetes for more than 20 years, around 2010, he began to notice bilateral feet numbness tingling, gradually getting worse over the past many years, now extending to knee level, also developed variable degree are sharp pain involving his lower extremity, needle prick sensation, burning, difficulty bearing weight,  He began to use cane few years back, now reliant on walker for longer distance since 2020  He also reported a history of lumbar minimally invasive procedure for low back pain left lumbar radicular pain in the past, now presenting with worsening lower extremity pain, radiating pain to left hip,  He also diagnosed with orthostatic hypotension, is on 3 agents for blood pressure control, this including Coreg 25 mg twice a day, clonidine 0.2  mg half tablets every day, isosorbide/hydrochlorothiazide 1 tablet 3 times a day,  He complains of declining functional status over the past few years, rely on his cane and walker for longer distance, transient sharp pain of bilateral lower extremity, despite uptitrating dose of gabapentin up to 300 mg 3 times a day, not a good candidate for higher dose due to chronic kidney disease,  His neuropathy symptoms continue to progress, now began to involving bilateral upper extremities since 2023, he denies bowel and bladder incontinence, is able to transfer himself in and out of wheelchair PAIN:  Are you having pain? Yes: NPRS scale: 5-8/10 Pain location: 8/10 LLE leg, 5/6 R LE 5/10  Pain description: sciatic pain is better, current pain is not sciatic- like a muscle pain. "Reports pain all the time"   Aggravating factors: standing, walking more Relieving factors: some medications   Vitals:   09/06/22 1541  BP: Marland Kitchen)  160/70  Pulse: 61      PRECAUTIONS: Fall and Other: BLT as tolerated per surgical nurse case manager , watch BPs   WEIGHT BEARING RESTRICTIONS: No  FALLS: Has patient fallen in last 6 months? No  LIVING ENVIRONMENT: Lives with: lives with their family Lives in: House/apartment Stairs: ground level but has 2 steps to go into living room; has basement and second floor of home  Has following equipment at home: Walker - 4 wheeled, [place]   PLOF: Independent, Independent with basic ADLs, Independent with gait, and Independent with transfers  PATIENT GOALS: be able to get through daily routine easier and safer, try to reduce pain if able   OBJECTIVE:   DIAGNOSTIC FINDINGS: CLINICAL DATA:  Bilateral leg pains. History of herniated disc repair   EXAM: LUMBAR SPINE - 2-3 VIEW   COMPARISON:  Lumbar spine MRI 03/06/2022   FINDINGS: No evidence of acute fracture. Mild loss of disc height at L4-L5 and mild-moderate loss of disc height at L5-S1. Moderate lumbar facet arthropathy  at L4-S1. Aortic atherosclerotic calcification.   IMPRESSION: No acute fracture. Degenerative changes at L4-S1 where there is mild-moderate disc space height loss and facet arthropathy.     TODAY'S TREATMENT:                                                                                                                              DATE:   Vitals:   09/06/22 1541  BP: (!) 160/70  Pulse: 61    THER ACT: At countertop: Monster walks Progressed to use of YTB Added to HEP  In // bars: Eccentric step downs from 4" step with BUE support 2 x 10 reps B, increased difficulty with 2nd set Alt L/R dot taps with fingertip support 2 x 10 reps B  Discussed pt obtaining his AFOs next week and to wear them to therapy sessions after he receives them. Discussed ideal type of tennis shoes to wear with AFOs and gradual AFO wear schedule.  PATIENT EDUCATION: Education details: continue HEP, added to HEP Person educated: Patient and Spouse Education method: Explanation, Demonstration, and Handouts Education comprehension: verbalized understanding and needs further education  HOME EXERCISE PROGRAM: Access Code: [email or web address] URL: [email or web address] Date: 08/28/2022 Prepared by: Excell Seltzer  Exercises - Standing Single Leg Stance with Counter Support  - 2 x daily - 5 x weekly - 3 sets - 10-15 hold - Sit to Stand  - 2 x daily - 5 x weekly - 1 sets - 10 reps - Standing Hip Abduction with Counter Support  - 2 x daily - 5 x weekly - 1-2 sets - 10 reps - Standing 3-way Hip with Walker  - 1 x daily - 7 x weekly - 1 sets - 10 reps - Side Stepping with Counter Support  - 1 x daily - 7 x weekly - 1 sets - 5 reps - Forward Monster Walk with Resistance at Ankles and Counter Support  -  1 x daily - 7 x weekly - 3 sets - 10 reps   GOALS: Goals reviewed with patient? No  SHORT TERM GOALS: Target date: 08/23/2022  Will be compliant with appropriate progressive HEP  Baseline: Goal  status: MET 11/30 compliant with daily HEP   2.  Will be able to maintain tandem stance for 15 seconds with no more than min guard B Baseline:  Goal status: IN PROGRESS 11/30- min guard to occasional MinA   3.  Will be able to demonstrate correct technique for skin checks/wear schedule for new AFOs after he has them  Baseline:  Goal status: IN PROGRESS 11/30- doesn't see [facility] until 12/20, trying to get in sooner via wait list   4.  Will score at least 34/56 on Berg  Baseline:  Goal status: MET11/30- 37    LONG TERM GOALS: Target date: 09/20/2022  MMT to improve by at least 1 grade in all weak groups  Baseline:  Goal status: IN PROGRESS  2.  Pain to be no more than 7/10 at worst  Baseline:  Goal status: IN PROGRESS11/30- can get to higher than 7/10 when sleeping or on days he walks more   3.  Will score at least 38/56 on Berg  Baseline:  Goal status: IN PROGRESS 11/30-37   4.  Will be able to ambulate household distances with no device and no unsteadiness, and community distances with LRAD with minimal fall risk  Baseline:  Goal status: IN PROGRESS 11/30- DNT   5.  Will be able to complete floor to stand transfers with no more than min guard assist  Baseline:  Goal status: IN PROGRESS 11/30- DNT     ASSESSMENT:  CLINICAL IMPRESSION: Emphasis of skilled PT session on continuing to work on LE strengthening and stability in single limb stance. Pt continues to exhibit ongoing BLE weakness and nerve impairments. Pt continues to benefit from skilled therapy services to address decreased strength, balance, and safety with functional mobility. Continue POC.    OBJECTIVE IMPAIRMENTS: Abnormal gait, decreased activity tolerance, decreased balance, decreased coordination, decreased knowledge of use of DME, difficulty walking, decreased strength, impaired sensation, and pain.   ACTIVITY LIMITATIONS: standing, squatting, stairs, transfers, and locomotion level  PARTICIPATION  LIMITATIONS: driving, shopping, community activity, and yard work  PERSONAL FACTORS: Age, Behavior pattern, Fitness, Past/current experiences, and Time since onset of injury/illness/exacerbation are also affecting patient's functional outcome.   REHAB POTENTIAL: Fair complex medical history and multiple chronic health issues contributing to complaints   CLINICAL DECISION MAKING: Evolving/moderate complexity  EVALUATION COMPLEXITY: Moderate  PLAN:  PT FREQUENCY: 2x/week  PT DURATION: 8 weeks  PLANNED INTERVENTIONS: Therapeutic exercises, Therapeutic activity, Neuromuscular re-education, Balance training, Gait training, Patient/Family education, Self Care, Joint mobilization, Stair training, Orthotic/Fit training, DME instructions, Taping, Manual therapy, and Re-evaluation  PLAN FOR NEXT SESSION:  BP  goal 150/80 per pt; watch BPs, orthostatic (but can run super high too) would focus on the functional and balance/strength/fall prevention. Has appointment at [facility]/20 but trying to move it up, add in core strengthening (no back precautions, just as tolerated)   Excell Seltzer, PT, DPT, CSRS  09/06/22 4:16 PM

## 2022-09-10 ENCOUNTER — Ambulatory Visit: Payer: BC Managed Care – PPO | Admitting: Cardiology

## 2022-09-10 ENCOUNTER — Encounter: Payer: Self-pay | Admitting: Physical Therapy

## 2022-09-10 ENCOUNTER — Ambulatory Visit: Payer: BC Managed Care – PPO | Admitting: Physical Therapy

## 2022-09-10 ENCOUNTER — Encounter: Payer: Self-pay | Admitting: Cardiology

## 2022-09-10 VITALS — BP 162/71 | HR 67

## 2022-09-10 VITALS — BP 153/77 | Resp 16 | Ht 67.0 in | Wt 182.0 lb

## 2022-09-10 DIAGNOSIS — M6281 Muscle weakness (generalized): Secondary | ICD-10-CM

## 2022-09-10 DIAGNOSIS — R2689 Other abnormalities of gait and mobility: Secondary | ICD-10-CM

## 2022-09-10 DIAGNOSIS — R2681 Unsteadiness on feet: Secondary | ICD-10-CM | POA: Diagnosis not present

## 2022-09-10 DIAGNOSIS — I10 Essential (primary) hypertension: Secondary | ICD-10-CM

## 2022-09-10 DIAGNOSIS — N184 Chronic kidney disease, stage 4 (severe): Secondary | ICD-10-CM

## 2022-09-10 MED ORDER — CARVEDILOL 25 MG PO TABS: 25.0000 mg | ORAL_TABLET | Freq: Two times a day (BID) | ORAL | 3 refills | Status: DC

## 2022-09-10 MED ORDER — DILTIAZEM HCL ER 90 MG PO CP12: 90.0000 mg | ORAL_CAPSULE | Freq: Two times a day (BID) | ORAL | 3 refills | Status: DC

## 2022-09-10 NOTE — Therapy (Signed)
OUTPATIENT PHYSICAL THERAPY NEURO TREATMENT   Patient Name: Tim Spencer MRN: [id number] DOB:1961-02-08, 61 y.o., male Today's Date: 09/10/2022   PCP: Lin Landsman MD  REFERRING PROVIDER: Marcial Pacas, MD   Physical Therapy Progress Note   Dates of Reporting Period:07/26/22 - 09/06/22  See Note below for Objective Data and Assessment of Progress/Goals.  Thank you for the referral of this patient. Excell Seltzer, PT, DPT, CSRS    PT End of Session - 09/10/22 1535     Visit Number 12    Number of Visits 17    Date for PT Re-Evaluation 09/20/22    Authorization Type BCBS    Authorization Time Period 07/26/22 to 09/20/22    Authorization - Number of Visits 16    PT Start Time 8590    PT Stop Time 1613    PT Time Calculation (min) 40 min    Equipment Utilized During Treatment Gait belt    Activity Tolerance Patient tolerated treatment well    Behavior During Therapy WFL for tasks assessed/performed                     Past Medical History:  Diagnosis Date   Anemia    CKD (chronic kidney disease) stage 3, GFR 30-59 ml/min (HCC)    Hypertension    Idiopathic progressive neuropathy    Secondary hyperparathyroidism, renal ([place])    Type 2 diabetes mellitus ([place])    Past Surgical History:  Procedure Laterality Date   GIVENS CAPSULE STUDY N/A 09/01/2020   Procedure: GIVENS CAPSULE STUDY;  Surgeon: Juanita Craver, MD;  Location: [facility] ENDOSCOPY;  Service: Endoscopy;  Laterality: N/A;   [place] SURGERY  06/05/2022   Patient Active Problem List   Diagnosis Date Noted   Diabetic peripheral neuropathy ([place]) 08/28/2022   Bilateral carpal tunnel syndrome 08/28/2022   Chronic heart failure with preserved ejection fraction ([place]) 08/01/2022   Stage 3b chronic kidney disease ([place]) 08/01/2022   Lumbar disc herniation with radiculopathy 06/05/2022   Dyslipidemia 06/01/2022   Spinal stenosis of lumbar region 03/07/2022   Gait abnormality 01/31/2022   Peripheral neuropathy  01/31/2022   Chronic left-sided low back pain with left-sided sciatica 01/31/2022   Abnormal stress test 08/27/2021   Heart failure ([place]) 08/14/2021   Supine hypertension 08/14/2021   Angina pectoris ([place]) 08/14/2021   Acute heart failure with preserved ejection fraction (HFpEF) (HCC)    Elevated troponin    Left carotid bruit    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)    NSTEMI (non-ST elevated myocardial infarction) ([place]) 07/21/2021   AKI (acute kidney injury) ([place])    Bilateral lower extremity edema    Anemia    Hypertensive urgency 06/28/2021   Chronic kidney disease, stage 3a ([employer])    Insulin-requiring or dependent type II diabetes mellitus ([place])    Hypothyroidism    Hyponatremia 07/05/2020   Orthostatic hypotension 06/06/2020   Bruit of right carotid artery 06/06/2020    ONSET DATE: 07/16/2022   REFERRING DIAG: M48.061 (ICD-10-CM) - Spinal stenosis of lumbar region, unspecified whether neurogenic claudication present G62.89 (ICD-10-CM) - Other polyneuropathy R26.9 (ICD-10-CM) - Gait abnormality   THERAPY DIAG:  Unsteadiness on feet  Other abnormalities of gait and mobility  Muscle weakness (generalized)  Rationale for Evaluation and Treatment: Rehabilitation  SUBJECTIVE:  SUBJECTIVE STATEMENT: Has been having more pain the past few days. No falls.   Pt accompanied by: significant other  PERTINENT HISTORY: ASSESSMENT AND PLAN   Tim Spencer is a 61 y.o. male     Severe lumbar stenosis at L4-5, Bilateral hands paresthesia Gait abnormalities             Status post decompression surgery on June 05, 2022 recovered well,             Still have residual right low back pain, radiating pain to right lower extremity, bilateral lower extremity  neuropathic pain, responding well to Cymbalta, but has GI side effect, will slow titrating 20 mg / 40 mg daily             EMG nerve conduction study, for evaluation of superimposed peripheral neuropathy, bilateral upper extremity focal neuropathy             Referred to physical therapy             Bilateral AFO     DIAGNOSTIC DATA (LABS, IMAGING, TESTING) - I reviewed patient records, labs, notes, testing and imaging myself where available.   Laboratory evaluations in February 2023, elevated TSH 6.720, normal free T4, triglyceride was elevated 299, LDL was 94 glucose was 227, creatinine was 3, A1c was 7.0, hemoglobin was 12.2,   MEDICAL HISTORY:   Tim Spencer, is a 61 year old male, seen in request by his primary care physician Dr. Ayesha Rumpf, [place], companied by his wife for evaluation of worsening bilateral lower extremity paresthesia, unsteady gait, fall,   I reviewed and summarized the referring note. PMHX. HLD HTN DM 21 years CKD   Patient has diabetes for more than 20 years, around 2010, he began to notice bilateral feet numbness tingling, gradually getting worse over the past many years, now extending to knee level, also developed variable degree are sharp pain involving his lower extremity, needle prick sensation, burning, difficulty bearing weight,  He began to use cane few years back, now reliant on walker for longer distance since 2020  He also reported a history of lumbar minimally invasive procedure for low back pain left lumbar radicular pain in the past, now presenting with worsening lower extremity pain, radiating pain to left hip,  He also diagnosed with orthostatic hypotension, is on 3 agents for blood pressure control, this including Coreg 25 mg twice a day, clonidine 0.2 mg half tablets every day, isosorbide/hydrochlorothiazide 1 tablet 3 times a day,  He complains of declining functional status over the past few years, rely on his cane and walker for longer distance,  transient sharp pain of bilateral lower extremity, despite uptitrating dose of gabapentin up to 300 mg 3 times a day, not a good candidate for higher dose due to chronic kidney disease,  His neuropathy symptoms continue to progress, now began to involving bilateral upper extremities since 2023, he denies bowel and bladder incontinence, is able to transfer himself in and out of wheelchair PAIN:  Are you having pain? Yes: NPRS scale: 7-9/10 Pain location: 9/10 LLE leg, 7-8/10 LLE  Pain description: sciatic pain is better, current pain is not sciatic- like a muscle pain. "Reports pain all the time"   Aggravating factors: standing, walking more Relieving factors: some medications   Vitals:   09/10/22 1540 09/10/22 1542  BP: (!) 176/77 (!) 162/71  Pulse: 68 67       PRECAUTIONS: Fall and Other: BLT as tolerated per surgical nurse case manager , watch BPs  WEIGHT BEARING RESTRICTIONS: No  FALLS: Has patient fallen in last 6 months? No  LIVING ENVIRONMENT: Lives with: lives with their family Lives in: House/apartment Stairs: ground level but has 2 steps to go into living room; has basement and second floor of home  Has following equipment at home: Walker - 4 wheeled, [employer]   PLOF: Independent, Independent with basic ADLs, Independent with gait, and Independent with transfers  PATIENT GOALS: be able to get through daily routine easier and safer, try to reduce pain if able   OBJECTIVE:   DIAGNOSTIC FINDINGS: CLINICAL DATA:  Bilateral leg pains. History of herniated disc repair   EXAM: LUMBAR SPINE - 2-3 VIEW   COMPARISON:  Lumbar spine MRI 03/06/2022   FINDINGS: No evidence of acute fracture. Mild loss of disc height at L4-L5 and mild-moderate loss of disc height at L5-S1. Moderate lumbar facet arthropathy at L4-S1. Aortic atherosclerotic calcification.   IMPRESSION: No acute fracture. Degenerative changes at L4-S1 where there is mild-moderate disc space height loss and  facet arthropathy.     TODAY'S TREATMENT:                                                                                                                              DATE:   Vitals:   09/10/22 1540 09/10/22 1542  BP: (!) 176/77 (!) 162/71  Pulse: 68 67     NMR: Seated on green balance disc for core activation, balance: Alternating seated marching x10 reps, progressed to adding in alternating UE lift x10 reps each side, pt more wobbly when adding in UE lift  Alternating LAQs x10 reps With 5 blaze pods set on random setting ; one on a foot step in front of pt (to tap with foot) 2 sets of 2 on chairs next to pt to reach laterally, 2 bouts of 1 minute each with pt keeping one leg lifted into a march and having to reach outside of BOS and tap colored blaze pod, switched whichever leg was lifted halfway through, pt fatigued easily with this  Cues for core activation and tall posture throughout   Standing with 5 blaze pods set on random one color placed in a semi-circle: 2 bouts of 2 minutes SLS taps with min guard as needed for balance, pt more challenged tapping with LLE. Pt needing a seated rest break between each set due to fatigue.    5 reps sit <> stand on air ex without UE support, cues for slowed pace and using UE support    PATIENT EDUCATION: Education details: continue HEP Person educated: Patient and Spouse Education method: Customer service manager Education comprehension: verbalized understanding and needs further education  HOME EXERCISE PROGRAM: Access Code: [email or web address] URL: [email or web address] Date: 08/28/2022 Prepared by: Excell Seltzer  Exercises - Standing Single Leg Stance with Counter Support  - 2 x daily - 5 x weekly - 3 sets - 10-15 hold - Sit to Stand  - 2 x daily -  5 x weekly - 1 sets - 10 reps - Standing Hip Abduction with Counter Support  - 2 x daily - 5 x weekly - 1-2 sets - 10 reps - Standing 3-way Hip with Walker  - 1 x daily - 7 x  weekly - 1 sets - 10 reps - Side Stepping with Counter Support  - 1 x daily - 7 x weekly - 1 sets - 5 reps - Forward Monster Walk with Resistance at Ankles and Counter Support  - 1 x daily - 7 x weekly - 3 sets - 10 reps   GOALS: Goals reviewed with patient? No  SHORT TERM GOALS: Target date: 08/23/2022  Will be compliant with appropriate progressive HEP  Baseline: Goal status: MET 11/30 compliant with daily HEP   2.  Will be able to maintain tandem stance for 15 seconds with no more than min guard B Baseline:  Goal status: IN PROGRESS 11/30- min guard to occasional MinA   3.  Will be able to demonstrate correct technique for skin checks/wear schedule for new AFOs after he has them  Baseline:  Goal status: IN PROGRESS 11/30- doesn't see [facility] until 12/20, trying to get in sooner via wait list   4.  Will score at least 34/56 on Berg  Baseline:  Goal status: MET11/30- 37    LONG TERM GOALS: Target date: 09/20/2022  MMT to improve by at least 1 grade in all weak groups  Baseline:  Goal status: IN PROGRESS  2.  Pain to be no more than 7/10 at worst  Baseline:  Goal status: IN PROGRESS11/30- can get to higher than 7/10 when sleeping or on days he walks more   3.  Will score at least 38/56 on Berg  Baseline:  Goal status: IN PROGRESS 11/30-37   4.  Will be able to ambulate household distances with no device and no unsteadiness, and community distances with LRAD with minimal fall risk  Baseline:  Goal status: IN PROGRESS 11/30- DNT   5.  Will be able to complete floor to stand transfers with no more than min guard assist  Baseline:  Goal status: IN PROGRESS 11/30- DNT     ASSESSMENT:  CLINICAL IMPRESSION: Today's skilled session worked on core strengthening and balance in seated and in standing. Pt challenged by seated balance on a compliant surface. Pt needing min guard for standing balance tasks without UE support. Pt needing intermittent rest breaks due to fatigue.  Continue POC.    OBJECTIVE IMPAIRMENTS: Abnormal gait, decreased activity tolerance, decreased balance, decreased coordination, decreased knowledge of use of DME, difficulty walking, decreased strength, impaired sensation, and pain.   ACTIVITY LIMITATIONS: standing, squatting, stairs, transfers, and locomotion level  PARTICIPATION LIMITATIONS: driving, shopping, community activity, and yard work  PERSONAL FACTORS: Age, Behavior pattern, Fitness, Past/current experiences, and Time since onset of injury/illness/exacerbation are also affecting patient's functional outcome.   REHAB POTENTIAL: Fair complex medical history and multiple chronic health issues contributing to complaints   CLINICAL DECISION MAKING: Evolving/moderate complexity  EVALUATION COMPLEXITY: Moderate  PLAN:  PT FREQUENCY: 2x/week  PT DURATION: 8 weeks  PLANNED INTERVENTIONS: Therapeutic exercises, Therapeutic activity, Neuromuscular re-education, Balance training, Gait training, Patient/Family education, Self Care, Joint mobilization, Stair training, Orthotic/Fit training, DME instructions, Taping, Manual therapy, and Re-evaluation  PLAN FOR NEXT SESSION:  BP  goal 150/80 per pt; watch BPs, orthostatic (but can run super high too) would focus on the functional and balance/strength/fall prevention. Has appointment at [facility] 12/20 but trying to  move it up, add in core strengthening (no back precautions, just as tolerated)   Janann August, PT, DPT 09/10/22 4:22 PM

## 2022-09-10 NOTE — Progress Notes (Signed)
Patient referred by Lin Landsman, MD for leg edema, orthostatic hypotension  Subjective:   Tim Spencer, male    DOB: 09/08/1961, 61 y.o.   MRN: [id number]  Chief Complaint  Patient presents with   Chronic heart failure with preserved ejection fraction   Follow-up    6 week   Dizziness     HPI  61 y.o. Tim Spencer male with hypertension, CKD 4, idiopathic progressive neuropathy, orthostatic hypotension/ supine hypertension, hyponatremia.  Reviewed recent test results with the patient, details below. Blood pressure slightly improved since starting chlorthalidone. Upcoming labs later this month. Leg swelling has returned.    Current Outpatient Medications:    ALPRAZolam (XANAX) 0.5 MG tablet, Take 0.5 mg by mouth 3 (three) times daily., Disp: , Rfl:    ascorbic acid (VITAMIN C) 500 MG tablet, Take 500 mg by mouth daily., Disp: , Rfl:    aspirin EC 81 MG EC tablet, Take 1 tablet (81 mg total) by mouth daily. Swallow whole., Disp: 30 tablet, Rfl: 1   atorvastatin (LIPITOR) 20 MG tablet, Take 20 mg by mouth daily after lunch. , Disp: , Rfl:    carvedilol (COREG) 25 MG tablet, Take 1 tablet (25 mg total) by mouth 2 (two) times daily with a meal., Disp: 180 tablet, Rfl: 3   chlorthalidone (HYGROTON) 25 MG tablet, Take 25 mg by mouth daily., Disp: , Rfl:    cloNIDine (CATAPRES) 0.2 MG tablet, Take 1 tablet (0.2 mg total) by mouth every evening. (Patient taking differently: Take 0.1-0.2 mg by mouth every evening.), Disp: 60 tablet, Rfl: 1   Continuous Blood Gluc Sensor (FREESTYLE LIBRE 2 SENSOR) MISC, Inject into the skin every 14 (fourteen) days., Disp: , Rfl:    diltiazem (CARDIZEM SR) 90 MG 12 hr capsule, Take 1 capsule by mouth twice daily, Disp: 180 capsule, Rfl: 0   DULoxetine (CYMBALTA) 20 MG capsule, One tab qam, 2tab qhs, Disp: 90 capsule, Rfl: 11   ferrous sulfate 325 (65 FE) MG tablet, Take 650 mg by mouth daily after supper. , Disp: , Rfl:    fluticasone (FLONASE) 50  MCG/ACT nasal spray, Place 2 sprays into both nostrils daily., Disp: , Rfl:    gabapentin (NEURONTIN) 300 MG capsule, Take 300 mg by mouth 3 (three) times daily., Disp: , Rfl:    HUMALOG KWIKPEN 100 UNIT/ML KwikPen, Inject 10-15 Units into the skin 3 (three) times daily before meals. Sliding Scale Insulin, Disp: , Rfl:    Iron-FA-B Cmp-C-Biot-Probiotic (FUSION PLUS) CAPS, Take 1 capsule by mouth daily., Disp: , Rfl:    levothyroxine (SYNTHROID) 175 MCG tablet, Take 175 mcg by mouth daily., Disp: , Rfl:    magnesium oxide (MAG-OX) 400 MG tablet, Take 400 mg by mouth daily after lunch., Disp: , Rfl:    Multiple Vitamin (MULTIVITAMIN WITH MINERALS) TABS tablet, Take 1 tablet by mouth daily after lunch., Disp: , Rfl:    nitroGLYCERIN (NITROSTAT) 0.4 MG SL tablet, Place 1 tablet (0.4 mg total) under the tongue every 5 (five) minutes x 3 doses as needed for chest pain., Disp: 30 tablet, Rfl: 12   Omega-3 1000 MG CAPS, Take 2,000 mg by mouth at bedtime. , Disp: , Rfl:    ondansetron (ZOFRAN-ODT) 4 MG disintegrating tablet, Take 4 mg by mouth 3 (three) times daily., Disp: , Rfl:    torsemide (DEMADEX) 20 MG tablet, Take 1 tablet (20 mg total) by mouth 2 (two) times daily., Disp: 60 tablet, Rfl: 2   TOUJEO SOLOSTAR 300  UNIT/ML Solostar Pen, Inject 30 Units into the skin daily. 30, Disp: , Rfl:    vitamin B-12 (CYANOCOBALAMIN) 1000 MCG tablet, Take 1,000 mcg by mouth daily., Disp: , Rfl:     Cardiovascular and other pertinent studies:  EKG 08/01/2022: Sinus rythm 71 bpm Normal EKG  Lexiscan Tetrofosmin stress test 08/23/2021: Lexiscan nuclear stress test performed using 1-day protocol. Stress EKG is non-diagnostic, as this is pharmacological stress test. in addition, rest and stress EKG showed sinus rhythm, inferolateral T wave inversion.  SPECT images show medium sized, mild intensity, reversible perfusion defect in apical to basal, inferior/inferolateral myocardium.  Stress LVEF 50% Low risk  study.  Echocardiogram 07/22/2021:  1. Left ventricular ejection fraction, by estimation, is 55 to 60%. The  left ventricle has normal function. The left ventricle has no regional  wall motion abnormalities. There is mild left ventricular hypertrophy.  Left ventricular diastolic parameters are consistent with Grade II diastolic dysfunction (pseudonormalization).   2. Right ventricular systolic function is normal. The right ventricular  size is normal.   3. Left atrial size was mildly dilated.   4. The mitral valve is normal in structure. Trivial mitral valve  regurgitation. No evidence of mitral stenosis.   5. The aortic valve is tricuspid. Aortic valve regurgitation is not  visualized. No aortic stenosis is present.   6. The inferior vena cava is normal in size with greater than 50%  respiratory variability, suggesting right atrial pressure of 3 mmHg.   Carotid artery duplex  06/20/2020:  Minimal stenosis in the right internal carotid artery (1-15%).  Minimal stenosis in the left internal carotid artery (1-15%).  Mild heterogeneous plaque noted in R>L carotid arteries.  Antegrade right vertebral artery flow. Antegrade left vertebral artery  Flow.  EKG 06/06/2020: Sinus rhythm 70 bpm Occaisonal PVC   Recent labs: 08/28/2022: Glucose 97, BUN/Cr 40/3.1. EGFR 22. Na/K 137/4.5. Albumin 3.6. Rest of the CMP normal H/H 10/31. MCV 88. Platelets 211 PTH, intact 92 (15-65)  07/12/2022: Glucose 123, BUN/Cr 24/2.17. EGFR 34. Na/K 141/4.8. Rest of the CMP normal   07/25/2021: Glucose 329, BUN/Cr 26/2.2. EGFR 33. Na/K 132/4.1. Albumin 2.6 H/H 7.9/23.4. MCV 90. Platelets 167 HbA1C 6.9% Chol 134, TG 40, HDL 44, LDL 82 TSH 4.4 normal    Review of Systems  Constitutional: Negative for malaise/fatigue.  Cardiovascular:  Negative for chest pain, dyspnea on exertion, leg swelling, palpitations and syncope.  Musculoskeletal:        Right toe pain  Neurological:  Positive for dizziness  and light-headedness.         Vitals:   09/10/22 0956 09/10/22 0957  BP:    Resp:    SpO2: 98% 98%   Orthostatic VS for the past 72 hrs (Last 3 readings):  Orthostatic BP Patient Position BP Location Cuff Size Orthostatic Pulse  09/10/22 0957 125/58 Standing Right Arm Normal 57  09/10/22 0956 155/67 Sitting Right Arm Normal 66  09/10/22 0955 157/70 Supine Right Arm Normal 64     Body mass index is 28.51 kg/m. Filed Weights   09/10/22 0951  Weight: 182 lb (82.6 kg)     Objective:   Physical Exam Vitals and nursing note reviewed.  Constitutional:      General: He is not in acute distress. Neck:     Vascular: No JVD.  Cardiovascular:     Rate and Rhythm: Normal rate and regular rhythm.     Pulses:          Carotid pulses are  on the right side with bruit.      Dorsalis pedis pulses are 2+ on the right side and 2+ on the left side.       Posterior tibial pulses are 2+ on the right side and 2+ on the left side.     Heart sounds: Normal heart sounds. No murmur heard. Pulmonary:     Effort: Pulmonary effort is normal.     Breath sounds: Normal breath sounds. No wheezing or rales.  Musculoskeletal:     Right lower leg: Edema (2+) present.     Left lower leg: Edema (2+) present.           Assessment & Recommendations:   61 y.o. Tim Spencer male with hypertension, CKD 4, idiopathic progressive neuropathy, orthostatic hypotension/ supine hypertension, hyponatremia.  Orthostatic hypotension/supine hypertension: Likely related to autonomic dysfunction in diabetic patient.  Reasonably well controlled blood pressure.  Provided Cr is stable after starting chlorthalidone, consider increasing torsemide to 40 ng in am and 20 mg in pm. I will send my note to Dr. Posey Pronto, to see if he agrees with the above. Given patient's fine balance re: sodium homeostasis, would be cautious in implementing changes as above.  F/u in 3 months   Nigel Mormon, MD Pager:  [phone] Office: [phone]

## 2022-09-11 ENCOUNTER — Ambulatory Visit: Payer: BC Managed Care – PPO | Admitting: Physical Therapy

## 2022-09-11 VITALS — BP 115/55 | HR 60

## 2022-09-11 DIAGNOSIS — M79604 Pain in right leg: Secondary | ICD-10-CM

## 2022-09-11 DIAGNOSIS — R2681 Unsteadiness on feet: Secondary | ICD-10-CM | POA: Diagnosis not present

## 2022-09-11 DIAGNOSIS — R2689 Other abnormalities of gait and mobility: Secondary | ICD-10-CM

## 2022-09-11 DIAGNOSIS — M6281 Muscle weakness (generalized): Secondary | ICD-10-CM

## 2022-09-11 NOTE — Therapy (Signed)
OUTPATIENT PHYSICAL THERAPY NEURO TREATMENT   Patient Name: Tim Spencer MRN: [id number] DOB:1960-10-08, 61 y.o., male Today's Date: 09/11/2022   PCP: Lin Landsman MD  REFERRING PROVIDER: Marcial Pacas, MD       PT End of Session - 09/11/22 1531     Visit Number 13    Number of Visits 17    Date for PT Re-Evaluation 09/20/22    Authorization Type [employer]    Authorization Time Period 07/26/22 to 09/20/22    Authorization - Number of Visits 16    PT Start Time 1530    PT Stop Time 1615    PT Time Calculation (min) 45 min    Equipment Utilized During Treatment Gait belt    Activity Tolerance Patient tolerated treatment well    Behavior During Therapy WFL for tasks assessed/performed                      Past Medical History:  Diagnosis Date   Anemia    CKD (chronic kidney disease) stage 3, GFR 30-59 ml/min (HCC)    Hypertension    Idiopathic progressive neuropathy    Secondary hyperparathyroidism, renal ([place])    Type 2 diabetes mellitus ([place])    Past Surgical History:  Procedure Laterality Date   GIVENS CAPSULE STUDY N/A 09/01/2020   Procedure: GIVENS CAPSULE STUDY;  Surgeon: Juanita Craver, MD;  Location: [facility] ENDOSCOPY;  Service: Endoscopy;  Laterality: N/A;   [place] [facility]  06/05/2022   Patient Active Problem List   Diagnosis Date Noted   Diabetic peripheral neuropathy ([place]) 08/28/2022   Bilateral carpal tunnel syndrome 08/28/2022   Chronic heart failure with preserved ejection fraction ([place]) 08/01/2022   Stage 3b chronic kidney disease ([place]) 08/01/2022   Lumbar disc herniation with radiculopathy 06/05/2022   Dyslipidemia 06/01/2022   Spinal stenosis of lumbar region 03/07/2022   Gait abnormality 01/31/2022   Peripheral neuropathy 01/31/2022   Chronic left-sided low back pain with left-sided sciatica 01/31/2022   Abnormal stress test 08/27/2021   Heart failure ([place]) 08/14/2021   Supine hypertension 08/14/2021   Angina pectoris ([place]) 08/14/2021    Acute heart failure with preserved ejection fraction (HFpEF) (HCC)    Elevated troponin    Left carotid bruit    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)    NSTEMI (non-ST elevated myocardial infarction) ([place]) 07/21/2021   AKI (acute kidney injury) ([place])    Bilateral lower extremity edema    Anemia    Hypertensive urgency 06/28/2021   Chronic kidney disease, stage 3a ([place])    Insulin-requiring or dependent type II diabetes mellitus ([place])    Hypothyroidism    Hyponatremia 07/05/2020   Orthostatic hypotension 06/06/2020   Bruit of right carotid artery 06/06/2020    ONSET DATE: 07/16/2022   REFERRING DIAG: M48.061 (ICD-10-CM) - Spinal stenosis of lumbar region, unspecified whether neurogenic claudication present G62.89 (ICD-10-CM) - Other polyneuropathy R26.9 (ICD-10-CM) - Gait abnormality   THERAPY DIAG:  Unsteadiness on feet  Other abnormalities of gait and mobility  Muscle weakness (generalized)  Pain in both lower extremities  Rationale for Evaluation and Treatment: Rehabilitation  SUBJECTIVE:  SUBJECTIVE STATEMENT: Pt reports some soreness in his BLE and his core after PT session yesterday.  Pt accompanied by: significant other  PERTINENT HISTORY: ASSESSMENT AND PLAN   Tim Spencer is a 61 y.o. male     Severe lumbar stenosis at L4-5, Bilateral hands paresthesia Gait abnormalities             Status post decompression surgery on June 05, 2022 recovered well,             Still have residual right low back pain, radiating pain to right lower extremity, bilateral lower extremity neuropathic pain, responding well to Cymbalta, but has GI side effect, will slow titrating 20 mg / 40 mg daily             EMG nerve conduction study, for evaluation of  superimposed peripheral neuropathy, bilateral upper extremity focal neuropathy             Referred to physical therapy             Bilateral AFO     DIAGNOSTIC DATA (LABS, IMAGING, TESTING) - I reviewed patient records, labs, notes, testing and imaging myself where available.   Laboratory evaluations in February 2023, elevated TSH 6.720, normal free T4, triglyceride was elevated 299, LDL was 94 glucose was 227, creatinine was 3, A1c was 7.0, hemoglobin was 12.2,   MEDICAL HISTORY:   Tim Spencer, is a 61 year old male, seen in request by his primary care physician Dr. Ayesha Rumpf, [place], companied by his wife for evaluation of worsening bilateral lower extremity paresthesia, unsteady gait, fall,   I reviewed and summarized the referring note. PMHX. HLD HTN DM 21 years CKD   Patient has diabetes for more than 20 years, around 2010, he began to notice bilateral feet numbness tingling, gradually getting worse over the past many years, now extending to knee level, also developed variable degree are sharp pain involving his lower extremity, needle prick sensation, burning, difficulty bearing weight,  He began to use cane few years back, now reliant on walker for longer distance since 2020  He also reported a history of lumbar minimally invasive procedure for low back pain left lumbar radicular pain in the past, now presenting with worsening lower extremity pain, radiating pain to left hip,  He also diagnosed with orthostatic hypotension, is on 3 agents for blood pressure control, this including Coreg 25 mg twice a day, clonidine 0.2 mg half tablets every day, isosorbide/hydrochlorothiazide 1 tablet 3 times a day,  He complains of declining functional status over the past few years, rely on his cane and walker for longer distance, transient sharp pain of bilateral lower extremity, despite uptitrating dose of gabapentin up to 300 mg 3 times a day, not a good candidate for higher dose due to chronic  kidney disease,  His neuropathy symptoms continue to progress, now began to involving bilateral upper extremities since 2023, he denies bowel and bladder incontinence, is able to transfer himself in and out of wheelchair PAIN:  Are you having pain? Yes: NPRS scale: 7-9/10 Pain location: 9/10 LLE leg, 7-8/10 LLE  Pain description: sciatic pain is better, current pain is not sciatic- like a muscle pain. "Reports pain all the time"   Aggravating factors: standing, walking more Relieving factors: some medications   Vitals:   09/11/22 1533 09/11/22 1606  BP: (!) 141/68 (!) 115/55  Pulse: 60    Initial BP assessed in sitting at beginning of session. Second BP assessed while in standing during session  when pt complains of feeling light-headed.     PRECAUTIONS: Fall and Other: BLT as tolerated per surgical nurse case manager , watch BPs   WEIGHT BEARING RESTRICTIONS: No  FALLS: Has patient fallen in last 6 months? No  LIVING ENVIRONMENT: Lives with: lives with their family Lives in: House/apartment Stairs: ground level but has 2 steps to go into living room; has basement and second floor of home  Has following equipment at home: Walker - 4 wheeled, [place]   PLOF: Independent, Independent with basic ADLs, Independent with gait, and Independent with transfers  PATIENT GOALS: be able to get through daily routine easier and safer, try to reduce pain if able   OBJECTIVE:   DIAGNOSTIC FINDINGS: CLINICAL DATA:  Bilateral leg pains. History of herniated disc repair   EXAM: LUMBAR SPINE - 2-3 VIEW   COMPARISON:  Lumbar spine MRI 03/06/2022   FINDINGS: No evidence of acute fracture. Mild loss of disc height at L4-L5 and mild-moderate loss of disc height at L5-S1. Moderate lumbar facet arthropathy at L4-S1. Aortic atherosclerotic calcification.   IMPRESSION: No acute fracture. Degenerative changes at L4-S1 where there is mild-moderate disc space height loss and facet arthropathy.      TODAY'S TREATMENT:                                                                                                                              DATE:   Vitals:   09/11/22 1533 09/11/22 1606  BP: (!) 141/68 (!) 115/55  Pulse: 60       NMR: ***  In // bars: 4" step taps 2" step taps with no UE support with RLE only for stance on LLE Gait with alt L/R gumdrop taps with with one UE support  THER EX: Seated exercises on green therapy ball Anterior/posterior pelvic*** R/L lateral pelvic*** Seated alt L/R marches  In // bars: Mini-squats on airex x 10 reps   PATIENT EDUCATION: Education details: continue HEP*** Person educated: Patient and Spouse Education method: Customer service manager Education comprehension: verbalized understanding and needs further education  HOME EXERCISE PROGRAM: Access Code: [email or web address] URL: [email or web address] Date: 08/28/2022 Prepared by: Excell Seltzer  Exercises - Standing Single Leg Stance with Counter Support  - 2 x daily - 5 x weekly - 3 sets - 10-15 hold - Sit to Stand  - 2 x daily - 5 x weekly - 1 sets - 10 reps - Standing Hip Abduction with Counter Support  - 2 x daily - 5 x weekly - 1-2 sets - 10 reps - Standing 3-way Hip with Walker  - 1 x daily - 7 x weekly - 1 sets - 10 reps - Side Stepping with Counter Support  - 1 x daily - 7 x weekly - 1 sets - 5 reps - Forward Monster Walk with Resistance at Ankles and Counter Support  - 1 x daily - 7 x weekly - 3 sets - 10 reps  GOALS: Goals reviewed with patient? No  SHORT TERM GOALS: Target date: 08/23/2022  Will be compliant with appropriate progressive HEP  Baseline: Goal status: MET 11/30 compliant with daily HEP   2.  Will be able to maintain tandem stance for 15 seconds with no more than min guard B Baseline:  Goal status: IN PROGRESS 11/30- min guard to occasional MinA   3.  Will be able to demonstrate correct technique for skin checks/wear schedule  for new AFOs after he has them  Baseline:  Goal status: IN PROGRESS 11/30- doesn't see [facility] until 12/20, trying to get in sooner via wait list   4.  Will score at least 34/56 on Berg  Baseline:  Goal status: MET11/30- 37    LONG TERM GOALS: Target date: 09/20/2022  MMT to improve by at least 1 grade in all weak groups  Baseline:  Goal status: IN PROGRESS  2.  Pain to be no more than 7/10 at worst  Baseline:  Goal status: IN PROGRESS11/30- can get to higher than 7/10 when sleeping or on days he walks more   3.  Will score at least 38/56 on Berg  Baseline:  Goal status: IN PROGRESS 11/30-37   4.  Will be able to ambulate household distances with no device and no unsteadiness, and community distances with LRAD with minimal fall risk  Baseline:  Goal status: IN PROGRESS 11/30- DNT   5.  Will be able to complete floor to stand transfers with no more than min guard assist  Baseline:  Goal status: IN PROGRESS 11/30- DNT     ASSESSMENT:  CLINICAL IMPRESSION: Emphasis of skilled PT session*** Continue POC.     OBJECTIVE IMPAIRMENTS: Abnormal gait, decreased activity tolerance, decreased balance, decreased coordination, decreased knowledge of use of DME, difficulty walking, decreased strength, impaired sensation, and pain.   ACTIVITY LIMITATIONS: standing, squatting, stairs, transfers, and locomotion level  PARTICIPATION LIMITATIONS: driving, shopping, community activity, and yard work  PERSONAL FACTORS: Age, Behavior pattern, Fitness, Past/current experiences, and Time since onset of injury/illness/exacerbation are also affecting patient's functional outcome.   REHAB POTENTIAL: Fair complex medical history and multiple chronic health issues contributing to complaints   CLINICAL DECISION MAKING: Evolving/moderate complexity  EVALUATION COMPLEXITY: Moderate  PLAN:  PT FREQUENCY: 2x/week  PT DURATION: 8 weeks  PLANNED INTERVENTIONS: Therapeutic exercises,  Therapeutic activity, Neuromuscular re-education, Balance training, Gait training, Patient/Family education, Self Care, Joint mobilization, Stair training, Orthotic/Fit training, DME instructions, Taping, Manual therapy, and Re-evaluation  PLAN FOR NEXT SESSION:  BP  goal 150/80 per pt; watch BPs, orthostatic (but can run super high too) would focus on the functional and balance/strength/fall prevention. Has appointment at [facility] 12/20 but trying to move it up, add in core strengthening to HEP (no back precautions, just as tolerated)***   Excell Seltzer, PT, DPT, CSRS  09/11/22 4:17 PM

## 2022-09-19 ENCOUNTER — Encounter: Payer: Self-pay | Admitting: Physical Therapy

## 2022-09-19 ENCOUNTER — Ambulatory Visit: Payer: BC Managed Care – PPO | Admitting: Physical Therapy

## 2022-09-19 VITALS — BP 151/73 | HR 66

## 2022-09-19 DIAGNOSIS — R2689 Other abnormalities of gait and mobility: Secondary | ICD-10-CM

## 2022-09-19 DIAGNOSIS — M6281 Muscle weakness (generalized): Secondary | ICD-10-CM

## 2022-09-19 DIAGNOSIS — R2681 Unsteadiness on feet: Secondary | ICD-10-CM | POA: Diagnosis not present

## 2022-09-19 NOTE — Therapy (Signed)
OUTPATIENT PHYSICAL THERAPY NEURO TREATMENT   Patient Name: Tim Spencer MRN: [id number] DOB:Aug 15, 1961, 61 y.o., male Today's Date: 09/19/2022  PCP: Lin Landsman MD  REFERRING PROVIDER: Marcial Pacas, MD     PT End of Session - 09/19/22 1526     Visit Number 14    Number of Visits 17    Date for PT Re-Evaluation 09/20/22    Authorization Type [employer]    Authorization Time Period 07/26/22 to 09/20/22    Authorization - Number of Visits 16    PT Start Time 1524    PT Stop Time 1608    PT Time Calculation (min) 44 min    Equipment Utilized During Treatment Gait belt    Activity Tolerance Patient tolerated treatment well    Behavior During Therapy WFL for tasks assessed/performed             Past Medical History:  Diagnosis Date   Anemia    CKD (chronic kidney disease) stage 3, GFR 30-59 ml/min (HCC)    Hypertension    Idiopathic progressive neuropathy    Secondary hyperparathyroidism, renal ([place])    Type 2 diabetes mellitus ([place])    Past Surgical History:  Procedure Laterality Date   GIVENS CAPSULE STUDY N/A 09/01/2020   Procedure: GIVENS CAPSULE STUDY;  Surgeon: Juanita Craver, MD;  Location: [facility];  Service: Endoscopy;  Laterality: N/A;   [place] SURGERY  06/05/2022   Patient Active Problem List   Diagnosis Date Noted   Diabetic peripheral neuropathy ([place]) 08/28/2022   Bilateral carpal tunnel syndrome 08/28/2022   Chronic heart failure with preserved ejection fraction ([place]) 08/01/2022   Stage 3b chronic kidney disease ([place]) 08/01/2022   Lumbar disc herniation with radiculopathy 06/05/2022   Dyslipidemia 06/01/2022   Spinal stenosis of lumbar region 03/07/2022   Gait abnormality 01/31/2022   Peripheral neuropathy 01/31/2022   Chronic left-sided low back pain with left-sided sciatica 01/31/2022   Abnormal stress test 08/27/2021   Heart failure ([place]) 08/14/2021   Supine hypertension 08/14/2021   Angina pectoris ([place]) 08/14/2021   Acute heart failure with  preserved ejection fraction (HFpEF) (HCC)    Elevated troponin    Left carotid bruit    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)    NSTEMI (non-ST elevated myocardial infarction) ([place]) 07/21/2021   AKI (acute kidney injury) ([place])    Bilateral lower extremity edema    Anemia    Hypertensive urgency 06/28/2021   Chronic kidney disease, stage 3a ([place])    Insulin-requiring or dependent type II diabetes mellitus ([place])    Hypothyroidism    Hyponatremia 07/05/2020   Orthostatic hypotension 06/06/2020   Bruit of right carotid artery 06/06/2020    ONSET DATE: 07/16/2022   REFERRING DIAG: M48.061 (ICD-10-CM) - Spinal stenosis of lumbar region, unspecified whether neurogenic claudication present G62.89 (ICD-10-CM) - Other polyneuropathy R26.9 (ICD-10-CM) - Gait abnormality   THERAPY DIAG:  Unsteadiness on feet  Other abnormalities of gait and mobility  Muscle weakness (generalized)  Rationale for Evaluation and Treatment: Rehabilitation  SUBJECTIVE:  SUBJECTIVE STATEMENT: Got his bilateral AFOs. Wearing them during clinic today. Has only worn them twice - once to therapy today and the other time in [place].   Pt accompanied by: significant other  PERTINENT HISTORY: ASSESSMENT AND PLAN   [name] is a 61 y.o. male     Severe lumbar stenosis at L4-5, Bilateral hands paresthesia Gait abnormalities             Status post decompression surgery on June 05, 2022 recovered well,             Still have residual right low back pain, radiating pain to right lower extremity, bilateral lower extremity neuropathic pain, responding well to Cymbalta, but has GI side effect, will slow titrating 20 mg / 40 mg daily             EMG nerve conduction study, for evaluation  of superimposed peripheral neuropathy, bilateral upper extremity focal neuropathy             Referred to physical therapy             Bilateral AFO     DIAGNOSTIC DATA (LABS, IMAGING, TESTING) - I reviewed patient records, labs, notes, testing and imaging myself where available.   Laboratory evaluations in February 2023, elevated TSH 6.720, normal free T4, triglyceride was elevated 299, LDL was 94 glucose was 227, creatinine was 3, A1c was 7.0, hemoglobin was 12.2,   MEDICAL HISTORY:   Tim Spencer, is a 61 year old male, seen in request by his primary care physician Dr. Ayesha Rumpf, [place], companied by his wife for evaluation of worsening bilateral lower extremity paresthesia, unsteady gait, fall,   I reviewed and summarized the referring note. PMHX. HLD HTN DM 21 years CKD   Patient has diabetes for more than 20 years, around 2010, he began to notice bilateral feet numbness tingling, gradually getting worse over the past many years, now extending to knee level, also developed variable degree are sharp pain involving his lower extremity, needle prick sensation, burning, difficulty bearing weight,  He began to use cane few years back, now reliant on walker for longer distance since 2020  He also reported a history of lumbar minimally invasive procedure for low back pain left lumbar radicular pain in the past, now presenting with worsening lower extremity pain, radiating pain to left hip,  He also diagnosed with orthostatic hypotension, is on 3 agents for blood pressure control, this including Coreg 25 mg twice a day, clonidine 0.2 mg half tablets every day, isosorbide/hydrochlorothiazide 1 tablet 3 times a day,  He complains of declining functional status over the past few years, rely on his cane and walker for longer distance, transient sharp pain of bilateral lower extremity, despite uptitrating dose of gabapentin up to 300 mg 3 times a day, not a good candidate for higher dose due to  chronic kidney disease,  His neuropathy symptoms continue to progress, now began to involving bilateral upper extremities since 2023, he denies bowel and bladder incontinence, is able to transfer himself in and out of wheelchair PAIN:  Are you having pain? Yes: NPRS scale: 7-9/10 Pain location: 9/10 LLE leg, 7-8/10 LLE  Pain description: sciatic pain is better, current pain is not sciatic- like a muscle pain. "Reports pain all the time"   Aggravating factors: standing, walking more Relieving factors: some medications   Vitals:   09/19/22 1535  BP: (!) 151/73  Pulse: 66      PRECAUTIONS: Fall and Other: BLT as tolerated  per surgical nurse case manager , watch BPs   WEIGHT BEARING RESTRICTIONS: No  FALLS: Has patient fallen in last 6 months? No  LIVING ENVIRONMENT: Lives with: lives with their family Lives in: House/apartment Stairs: ground level but has 2 steps to go into living room; has basement and second floor of home  Has following equipment at home: Walker - 4 wheeled, [place]   PLOF: Independent, Independent with basic ADLs, Independent with gait, and Independent with transfers  PATIENT GOALS: be able to get through daily routine easier and safer, try to reduce pain if able   OBJECTIVE:   DIAGNOSTIC FINDINGS: CLINICAL DATA:  Bilateral leg pains. History of herniated disc repair   EXAM: LUMBAR SPINE - 2-3 VIEW   COMPARISON:  Lumbar spine MRI 03/06/2022   FINDINGS: No evidence of acute fracture. Mild loss of disc height at L4-L5 and mild-moderate loss of disc height at L5-S1. Moderate lumbar facet arthropathy at L4-S1. Aortic atherosclerotic calcification.   IMPRESSION: No acute fracture. Degenerative changes at L4-S1 where there is mild-moderate disc space height loss and facet arthropathy.     TODAY'S TREATMENT:                                                                                                                               Vitals:   09/19/22  1535  BP: (!) 151/73  Pulse: 66     THERAPEUTIC ACTIVITY Reviewed wear schedule with AFOs and provided handout for pt regarding this information. Pt reporting some rubbing on his R heel with R AFO. PT assessed pt's skin with no breakdown/discoloration/redness. Discussed importance of monitoring pt's skin after wearing AFOs due to pt's medical co morbidities such as diabetes. Educated that pt should reach out to [employer] and let them know that his AFO is rubbing and is causing him discomfort in case they need to readjust or offer any further suggestions. Discussed proper shoe wear with AFO and pt has a slightly larger shoe that he can wear with them and stated that want shoes and laces to be snug and not too tight or too loose as pt's current shoes were initially tied a little too loose that looked like his R AFO was moving up and down when ambulating that could be contributing to his sore spot.   LOWER EXTREMITY MMT:   From eval on 07/26/22   MMT Right Eval Left Eval  Hip flexion      Hip extension      Hip abduction      Hip adduction      Hip internal rotation      Hip external rotation      Knee flexion 4 4-  Knee extension 4+ 4-  Ankle dorsiflexion 4 4-  Ankle plantarflexion      Ankle inversion      Ankle eversion      (Blank rows = not tested)  On 09/19/22:    MMT Right  Eval Left Eval  Hip flexion  4+ 4+   Hip extension      Hip abduction      Hip adduction      Hip internal rotation      Hip external rotation      Knee flexion 4+ 4-  Knee extension 4 4  Ankle dorsiflexion    Ankle plantarflexion      Ankle inversion      Ankle eversion      (Blank rows = not tested)   [facility] PT Assessment - 09/19/22 1550       Berg Balance Test   Sit to Stand Able to stand  independently using hands    Standing Unsupported Able to stand safely 2 minutes    Sitting with Back Unsupported but Feet Supported on Floor or Stool Able to sit safely and securely 2 minutes    Stand to  Sit Sits safely with minimal use of hands    Transfers Able to transfer safely, minor use of hands    Standing Unsupported with Eyes Closed Able to stand 10 seconds with supervision    Standing Unsupported with Feet Together Able to place feet together independently and stand for 1 minute with supervision    From Standing, Reach Forward with Outstretched Arm Can reach confidently >25 cm (10")    From Standing Position, Pick up Object from Floor Able to pick up shoe, needs supervision    From Standing Position, Turn to Look Behind Over each Shoulder Looks behind from both sides and weight shifts well    Turn 360 Degrees Able to turn 360 degrees safely but slowly   21 seconds to R, 24 seconds to L   Standing Unsupported, Alternately Place Feet on Step/Stool Able to complete >2 steps/needs minimal assist    Standing Unsupported, One Foot in Front Able to take small step independently and hold 30 seconds    Standing on One Leg Tries to lift leg/unable to hold 3 seconds but remains standing independently   with RLE   Total Score 42    Berg comment: 42/56 = Significant Fall Risk            Ambulated x2 laps with rollator with pt's bilateral AFOs around therapy gym with pt reporting that his legs got more fatigued with use of AFOs and needed to rest. When walking, it did not look like pt's R shoe was on tight enough and his heel was going up and down in his shoe.    PATIENT EDUCATION: Education details: See therapeutic activity section above, results of goals, D/C at next session (due to visit limit and pt would like to conserve some visits for next year).  Person educated: Patient and Spouse Education method: Explanation, Demonstration, Verbal cues, and Handouts Education comprehension: verbalized understanding, returned demonstration, and needs further education  HOME EXERCISE PROGRAM: Access Code: [email or web address] URL: [email or web address] Date: 08/28/2022 Prepared by: Excell Seltzer  Exercises - Standing Single Leg Stance with Counter Support  - 2 x daily - 5 x weekly - 3 sets - 10-15 hold - Sit to Stand  - 2 x daily - 5 x weekly - 1 sets - 10 reps - Standing Hip Abduction with Counter Support  - 2 x daily - 5 x weekly - 1-2 sets - 10 reps - Standing 3-way Hip with Walker  - 1 x daily - 7 x weekly - 1 sets - 10 reps - Side Stepping with Counter Support  -  1 x daily - 7 x weekly - 1 sets - 5 reps - Forward Monster Walk with Resistance at Ankles and Counter Support  - 1 x daily - 7 x weekly - 3 sets - 10 reps   GOALS: Goals reviewed with patient? No  SHORT TERM GOALS: Target date: 08/23/2022  Will be compliant with appropriate progressive HEP  Baseline: Goal status: MET 11/30 compliant with daily HEP   2.  Will be able to maintain tandem stance for 15 seconds with no more than min guard B Baseline:  Goal status: IN PROGRESS 11/30- min guard to occasional MinA   3.  Will be able to demonstrate correct technique for skin checks/wear schedule for new AFOs after he has them  Baseline:  Goal status: IN PROGRESS 11/30- doesn't see [place] until 12/20, trying to get in sooner via wait list   4.  Will score at least 34/56 on Berg  Baseline:  Goal status: MET11/30- 37    LONG TERM GOALS: Target date: 09/20/2022  MMT to improve by at least 1 grade in all weak groups  Baseline: see above MMT charts Goal status: NOT MET  2.  Pain to be no more than 7/10 at worst  Baseline:  Goal status: IN PROGRESS11/30- can get to higher than 7/10 when sleeping or on days he walks more   3.  Will score at least 38/56 on Berg  Baseline: 42/56 on 09/19/22  Goal status: MET   4.  Will be able to ambulate household distances with no device and no unsteadiness, and community distances with LRAD with minimal fall risk  Baseline:  Goal status: IN PROGRESS 11/30- DNT   5.  Will be able to complete floor to stand transfers with no more than min guard assist  Baseline:   Goal status: IN PROGRESS 11/30- DNT     ASSESSMENT:  CLINICAL IMPRESSION: Pt ambulated into clinic today wearing bilateral PLS AFOs that he had recently received from [place]. Reviewed wear schedule with pt and pt's wife and provided handout. Pt reporting discomfort behind R heel and PT assessed skin with no concerns at this time, but did educate them to follow up with [place] clinic regarding this. Began to assess pt's LTGs for D/C at the end of this week. Pt did not meet LTG #1 in regards to MMT (see above charts for further details). Pt met LTG #3 in regards to BERG. Scored a 42/56 today (previously was 28 and 37), indicating pt is at a significant fall risk. Pt with most difficulty with turning, SLS, and tandem stance. Will check remainder of goals at next session and D/C from PT. Pt has a 20 visit limit and pt wants to conserve his visits for later in the year if he needs therapy again.    OBJECTIVE IMPAIRMENTS: Abnormal gait, decreased activity tolerance, decreased balance, decreased coordination, decreased knowledge of use of DME, difficulty walking, decreased strength, impaired sensation, and pain.   ACTIVITY LIMITATIONS: standing, squatting, stairs, transfers, and locomotion level  PARTICIPATION LIMITATIONS: driving, shopping, community activity, and yard work  PERSONAL FACTORS: Age, Behavior pattern, Fitness, Past/current experiences, and Time since onset of injury/illness/exacerbation are also affecting patient's functional outcome.   REHAB POTENTIAL: Fair complex medical history and multiple chronic health issues contributing to complaints   CLINICAL DECISION MAKING: Evolving/moderate complexity  EVALUATION COMPLEXITY: Moderate  PLAN:  PT FREQUENCY: 2x/week  PT DURATION: 8 weeks  PLANNED INTERVENTIONS: Therapeutic exercises, Therapeutic activity, Neuromuscular re-education, Balance training, Gait training, Patient/Family  education, Self Care, Joint mobilization, Stair  training, Orthotic/Fit training, DME instructions, Taping, Manual therapy, and Re-evaluation  PLAN FOR NEXT SESSION: check remainder of LTGs and D/C. Review final HEP. Review AFO info with pt as needed.    Janann August, PT, DPT 09/20/22 8:42 AM

## 2022-09-20 ENCOUNTER — Ambulatory Visit: Payer: BC Managed Care – PPO | Admitting: Physical Therapy

## 2022-09-20 DIAGNOSIS — R2689 Other abnormalities of gait and mobility: Secondary | ICD-10-CM

## 2022-09-20 DIAGNOSIS — M6281 Muscle weakness (generalized): Secondary | ICD-10-CM

## 2022-09-20 DIAGNOSIS — R2681 Unsteadiness on feet: Secondary | ICD-10-CM

## 2022-09-20 DIAGNOSIS — M79604 Pain in right leg: Secondary | ICD-10-CM

## 2022-09-20 NOTE — Therapy (Signed)
OUTPATIENT PHYSICAL THERAPY NEURO TREATMENT-DISCHARGE NOTE   Patient Name: Tim Spencer MRN: [id number] DOB:[birth year], 61 y.o., male Today's Date: 09/20/2022   PHYSICAL THERAPY DISCHARGE SUMMARY  Visits from Start of Care: 15  Current functional level related to goals / functional outcomes: Mod I with rollator   Remaining deficits: Decreased balance, ongoing neuropathic pain   Education / Equipment: Handout for HEP   Patient agrees to discharge. Patient goals were partially met. Patient is being discharged due to  patient request to d/c at end of this POC due to visit limit per year based on insurance.    PCP: Lin Landsman MD  REFERRING PROVIDER: Marcial Pacas, MD     PT End of Session - 09/20/22 1531     Visit Number 15    Number of Visits 17    Date for PT Re-Evaluation 09/20/22    Authorization Type BCBS    Authorization Time Period 07/26/22 to 09/20/22    Authorization - Number of Visits 16    PT Start Time 1530    PT Stop Time 1615    PT Time Calculation (min) 45 min    Equipment Utilized During Treatment Gait belt    Activity Tolerance Patient tolerated treatment well    Behavior During Therapy WFL for tasks assessed/performed             Past Medical History:  Diagnosis Date   Anemia    CKD (chronic kidney disease) stage 3, GFR 30-59 ml/min (HCC)    Hypertension    Idiopathic progressive neuropathy    Secondary hyperparathyroidism, renal ([place])    Type 2 diabetes mellitus ([place])    Past Surgical History:  Procedure Laterality Date   GIVENS CAPSULE STUDY N/A 09/01/2020   Procedure: GIVENS CAPSULE STUDY;  Surgeon: Juanita Craver, MD;  Location: [facility] ENDOSCOPY;  Service: Endoscopy;  Laterality: N/A;   [place] SURGERY  06/05/2022   Patient Active Problem List   Diagnosis Date Noted   Diabetic peripheral neuropathy ([place]) 08/28/2022   Bilateral carpal tunnel syndrome 08/28/2022   Chronic heart failure with preserved ejection fraction ([place]) 08/01/2022    Stage 3b chronic kidney disease ([place]) 08/01/2022   Lumbar disc herniation with radiculopathy 06/05/2022   Dyslipidemia 06/01/2022   Spinal stenosis of lumbar region 03/07/2022   Gait abnormality 01/31/2022   Peripheral neuropathy 01/31/2022   Chronic left-sided low back pain with left-sided sciatica 01/31/2022   Abnormal stress test 08/27/2021   Heart failure ([place]) 08/14/2021   Supine hypertension 08/14/2021   Angina pectoris ([place]) 08/14/2021   Acute heart failure with preserved ejection fraction (HFpEF) (HCC)    Elevated troponin    Left carotid bruit    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)    NSTEMI (non-ST elevated myocardial infarction) ([place]) 07/21/2021   AKI (acute kidney injury) ([place])    Bilateral lower extremity edema    Anemia    Hypertensive urgency 06/28/2021   Chronic kidney disease, stage 3a ([place])    Insulin-requiring or dependent type II diabetes mellitus ([place])    Hypothyroidism    Hyponatremia 07/05/2020   Orthostatic hypotension 06/06/2020   Bruit of right carotid artery 06/06/2020    ONSET DATE: 07/16/2022   REFERRING DIAG: M48.061 (ICD-10-CM) - Spinal stenosis of lumbar region, unspecified whether neurogenic claudication present G62.89 (ICD-10-CM) - Other polyneuropathy R26.9 (ICD-10-CM) - Gait abnormality   THERAPY DIAG:  Unsteadiness on feet  Other abnormalities of gait and mobility  Muscle weakness (generalized)  Pain in both lower extremities  Rationale for Evaluation and Treatment: Rehabilitation  SUBJECTIVE:                                                                                                                                                                                             SUBJECTIVE STATEMENT: Pt enters the clinic wearing his new AFOs he got last week. Pt reports yesterday he wore size 11 shoes and they were too tight with his AFOs, wearing size 12 today and he is having no pain from his  braces. Pt does report ongoing chronic LE pain of 9/10.  Pt accompanied by: significant other  PERTINENT HISTORY: ASSESSMENT AND PLAN   Tim Spencer is a 61 y.o. male     Severe lumbar stenosis at L4-5, Bilateral hands paresthesia Gait abnormalities             Status post decompression surgery on June 05, 2022 recovered well,             Still have residual right low back pain, radiating pain to right lower extremity, bilateral lower extremity neuropathic pain, responding well to Cymbalta, but has GI side effect, will slow titrating 20 mg / 40 mg daily             EMG nerve conduction study, for evaluation of superimposed peripheral neuropathy, bilateral upper extremity focal neuropathy             Referred to physical therapy             Bilateral AFO     DIAGNOSTIC DATA (LABS, IMAGING, TESTING) - I reviewed patient records, labs, notes, testing and imaging myself where available.   Laboratory evaluations in February 2023, elevated TSH 6.720, normal free T4, triglyceride was elevated 299, LDL was 94 glucose was 227, creatinine was 3, A1c was 7.0, hemoglobin was 12.2,   MEDICAL HISTORY:   Tim Spencer, is a 61 year old male, seen in request by his primary care physician Dr. Ayesha Rumpf, [place], companied by his wife for evaluation of worsening bilateral lower extremity paresthesia, unsteady gait, fall,   I reviewed and summarized the referring note. PMHX. HLD HTN DM 21 years CKD   Patient has diabetes for more than 20 years, around 2010, he began to notice bilateral feet numbness tingling, gradually getting worse over the past many years, now extending to knee level, also developed variable degree are sharp pain involving his lower extremity, needle prick sensation, burning, difficulty bearing weight,  He began to use cane few years back, now reliant on walker for longer distance since 2020  He also reported a history  of lumbar minimally invasive procedure for low back pain  left lumbar radicular pain in the past, now presenting with worsening lower extremity pain, radiating pain to left hip,  He also diagnosed with orthostatic hypotension, is on 3 agents for blood pressure control, this including Coreg 25 mg twice a day, clonidine 0.2 mg half tablets every day, isosorbide/hydrochlorothiazide 1 tablet 3 times a day,  He complains of declining functional status over the past few years, rely on his cane and walker for longer distance, transient sharp pain of bilateral lower extremity, despite uptitrating dose of gabapentin up to 300 mg 3 times a day, not a good candidate for higher dose due to chronic kidney disease,  His neuropathy symptoms continue to progress, now began to involving bilateral upper extremities since 2023, he denies bowel and bladder incontinence, is able to transfer himself in and out of wheelchair PAIN:  Are you having pain? Yes: NPRS scale: 7-9/10 Pain location: 9/10 LLE leg, 7-8/10 LLE  Pain description: sciatic pain is better, current pain is not sciatic- like a muscle pain. "Reports pain all the time"   Aggravating factors: standing, walking more Relieving factors: some medications   There were no vitals filed for this visit.   PRECAUTIONS: Fall and Other: BLT as tolerated per surgical nurse case manager , watch BPs   WEIGHT BEARING RESTRICTIONS: No  FALLS: Has patient fallen in last 6 months? No  LIVING ENVIRONMENT: Lives with: lives with their family Lives in: House/apartment Stairs: ground level but has 2 steps to go into living room; has basement and second floor of home  Has following equipment at home: Walker - 4 wheeled, [place]   PLOF: Independent, Independent with basic ADLs, Independent with gait, and Independent with transfers  PATIENT GOALS: be able to get through daily routine easier and safer, try to reduce pain if able   OBJECTIVE:   DIAGNOSTIC FINDINGS: CLINICAL DATA:  Bilateral leg pains. History of herniated  disc repair   EXAM: LUMBAR SPINE - 2-3 VIEW   COMPARISON:  Lumbar spine MRI 03/06/2022   FINDINGS: No evidence of acute fracture. Mild loss of disc height at L4-L5 and mild-moderate loss of disc height at L5-S1. Moderate lumbar facet arthropathy at L4-S1. Aortic atherosclerotic calcification.   IMPRESSION: No acute fracture. Degenerative changes at L4-S1 where there is mild-moderate disc space height loss and facet arthropathy.     TODAY'S TREATMENT:                                                                                                                               There were no vitals filed for this visit.  GAIT: Gait on treadmill at 1.0 mph x 5 min, RPE 8/10 to assess if pt safe to do this at home. Encouraged pt to start with 2.5-3 min walks at 1.0 mph on treadmill at home and work on adding 1 min per week as tolerated, monitoring fatigue level during exercise  and recovery time.  THER EX: Added core strengthening exercises to HEP, see bolded below   LOWER EXTREMITY MMT:   From eval on 07/26/22   MMT Right Eval Left Eval  Hip flexion      Hip extension      Hip abduction      Hip adduction      Hip internal rotation      Hip external rotation      Knee flexion 4 4-  Knee extension 4+ 4-  Ankle dorsiflexion 4 4-  Ankle plantarflexion      Ankle inversion      Ankle eversion      (Blank rows = not tested)  On 09/19/22:    MMT Right Eval Left Eval  Hip flexion  4+ 4+   Hip extension      Hip abduction      Hip adduction      Hip internal rotation      Hip external rotation      Knee flexion 4+ 4-  Knee extension 4 4  Ankle dorsiflexion    Ankle plantarflexion      Ankle inversion      Ankle eversion      (Blank rows = not tested)       PATIENT EDUCATION: Education details: continue HEP, d/c from PT, how to setup MedBridge app Person educated: Patient and Spouse Education method: Explanation, Media planner, Verbal cues, and  Handouts Education comprehension: verbalized understanding and returned demonstration  HOME EXERCISE PROGRAM: Access Code: [email or web address] URL: [email or web address] Date: 08/28/2022 Prepared by: Excell Seltzer  Exercises - Standing Single Leg Stance with Counter Support  - 2 x daily - 5 x weekly - 3 sets - 10-15 hold - Sit to Stand  - 2 x daily - 5 x weekly - 1 sets - 10 reps - Standing Hip Abduction with Counter Support  - 2 x daily - 5 x weekly - 1-2 sets - 10 reps - Standing 3-way Hip with Walker  - 1 x daily - 7 x weekly - 1 sets - 10 reps - Side Stepping with Counter Support  - 1 x daily - 7 x weekly - 1 sets - 5 reps - Forward Monster Walk with Resistance at Ankles and Counter Support  - 1 x daily - 7 x weekly - 3 sets - 10 reps - Supine Transversus Abdominis Bracing - Hands on Stomach  - 1 x daily - 7 x weekly - 3 sets - 10 reps - Supine Posterior Pelvic Tilt  - 1 x daily - 7 x weekly - 3 sets - 10 reps - Supine Single Bent Knee Fallout  - 1 x daily - 7 x weekly - 3 sets - 10 reps   GOALS: Goals reviewed with patient? No  SHORT TERM GOALS: Target date: 08/23/2022  Will be compliant with appropriate progressive HEP  Baseline: Goal status: MET 11/30 compliant with daily HEP   2.  Will be able to maintain tandem stance for 15 seconds with no more than min guard B Baseline:  Goal status: IN PROGRESS 11/30- min guard to occasional MinA   3.  Will be able to demonstrate correct technique for skin checks/wear schedule for new AFOs after he has them  Baseline:  Goal status: IN PROGRESS 11/30- doesn't see [facility] until 12/20, trying to get in sooner via wait list   4.  Will score at least 34/56 on Berg  Baseline:  Goal status: MET11/30- 37  LONG TERM GOALS: Target date: 09/20/2022  MMT to improve by at least 1 grade in all weak groups  Baseline: see above MMT charts Goal status: NOT MET  2.  Pain to be no more than 7/10 at worst  Baseline:  Goal status: NOT  MET11/30- can get to higher than 7/10 when sleeping or on days he walks more ; 12/28-reports 9/10 pain most days  3.  Will score at least 38/56 on Berg  Baseline: 42/56 on 09/19/22  Goal status: MET   4.  Will be able to ambulate household distances with no device and no unsteadiness, and community distances with LRAD with minimal fall risk  Baseline:  Goal status: NOT MET 11/30- DNT; 12/28-pt reports ambulating in the home without device but "furniture-walking" due to ongoing unsteadiness, cannot safely ambulate without an AD at this time   5.  Will be able to complete floor to stand transfers with no more than min guard assist  Baseline:  Goal status: NOT MET 11/30- DNT; 12/28-DNT due to safety concerns    ASSESSMENT:  CLINICAL IMPRESSION: Emphasis of skilled PT session on reassessing LTG in preparation for d/c from PT services this date as well as adding core strengthening exercises to HEP. Pt has only met 1/5 LTG due to improving his Berg Balance Test score to 42/56, demonstrating decreased fall risk. He exhibited some improvement in BLE MMT but did not improve by one grade and continues to experience chronic pain in his BLE, therefore not meeting his goal of improving his pain to </= 7/10 as he reports 9/10 pain at all times minimum. Additionally, it was not safe to assess a floor transfer with patient due to decreased flexibility, strength, and mobility as well as per pt report he is currently unsafe to ambulate without an AD, therefore not meeting two goals as they were not tested for safety reasons. Overall, pt has shown improvement in his functional strength, increased safety with use of his rollator in the clinic, and good performance of his HEP. Pt to continue to work on LE and core strengthening as well as balance via his HEP, to d/c from outpatient PT services at this time.   OBJECTIVE IMPAIRMENTS: Abnormal gait, decreased activity tolerance, decreased balance, decreased  coordination, decreased knowledge of use of DME, difficulty walking, decreased strength, impaired sensation, and pain.   ACTIVITY LIMITATIONS: standing, squatting, stairs, transfers, and locomotion level  PARTICIPATION LIMITATIONS: driving, shopping, community activity, and yard work  PERSONAL FACTORS: Age, Behavior pattern, Fitness, Past/current experiences, and Time since onset of injury/illness/exacerbation are also affecting patient's functional outcome.   REHAB POTENTIAL: Fair complex medical history and multiple chronic health issues contributing to complaints   CLINICAL DECISION MAKING: Evolving/moderate complexity  EVALUATION COMPLEXITY: Moderate      Excell Seltzer, PT, DPT, CSRS 09/20/22 4:16 PM

## 2022-12-10 ENCOUNTER — Ambulatory Visit: Payer: BC Managed Care – PPO | Admitting: Cardiology

## 2022-12-11 ENCOUNTER — Ambulatory Visit: Payer: BC Managed Care – PPO | Admitting: Cardiology

## 2023-09-11 IMAGING — CR DG CHEST 2V
2 series · 2 of 2 positions shown · non-contrast
Comparison: None.

CLINICAL DATA: Shortness of breath, dizziness, and chills for 3
days.

EXAM:
CHEST - 2 VIEW

[w chest lat]
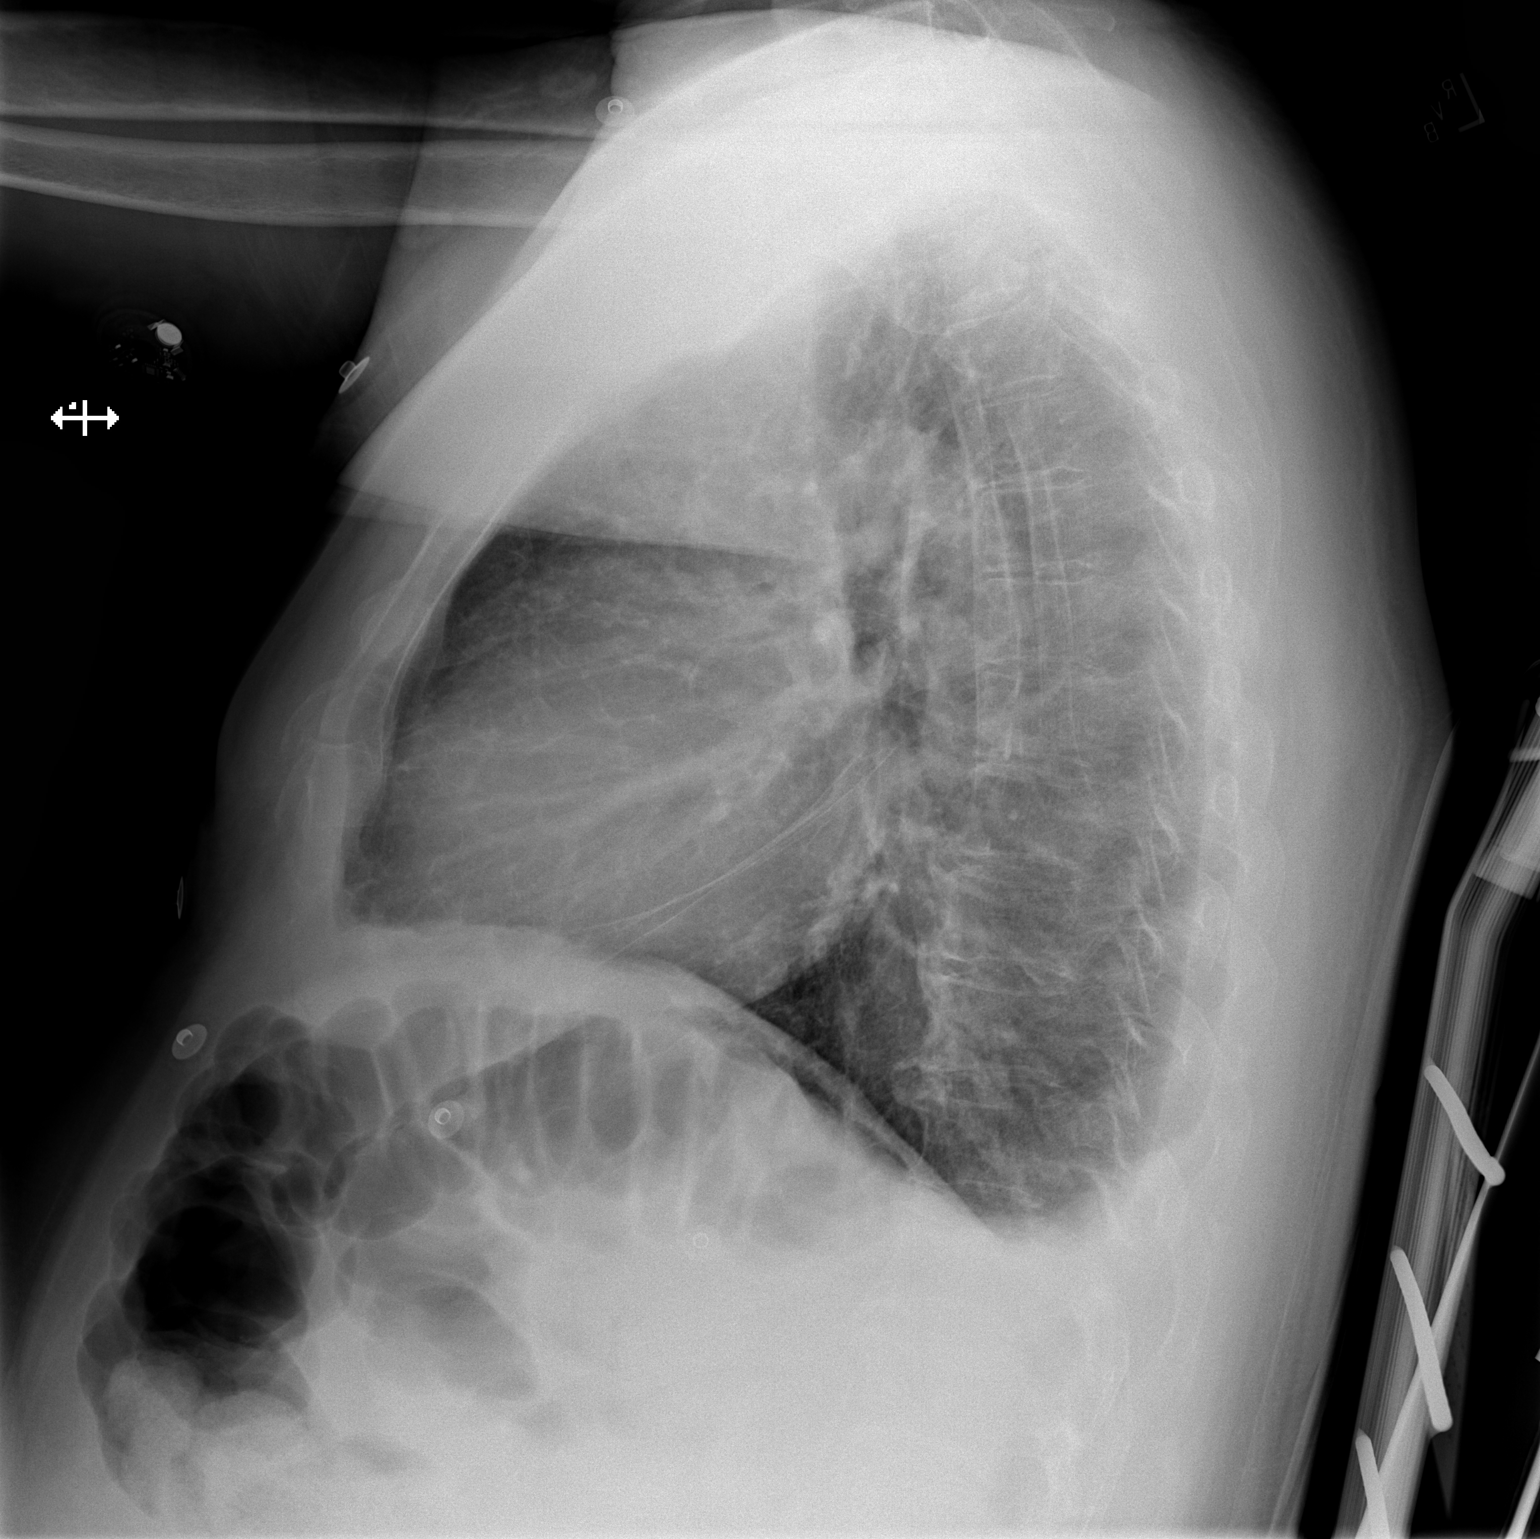

[x chest ap]
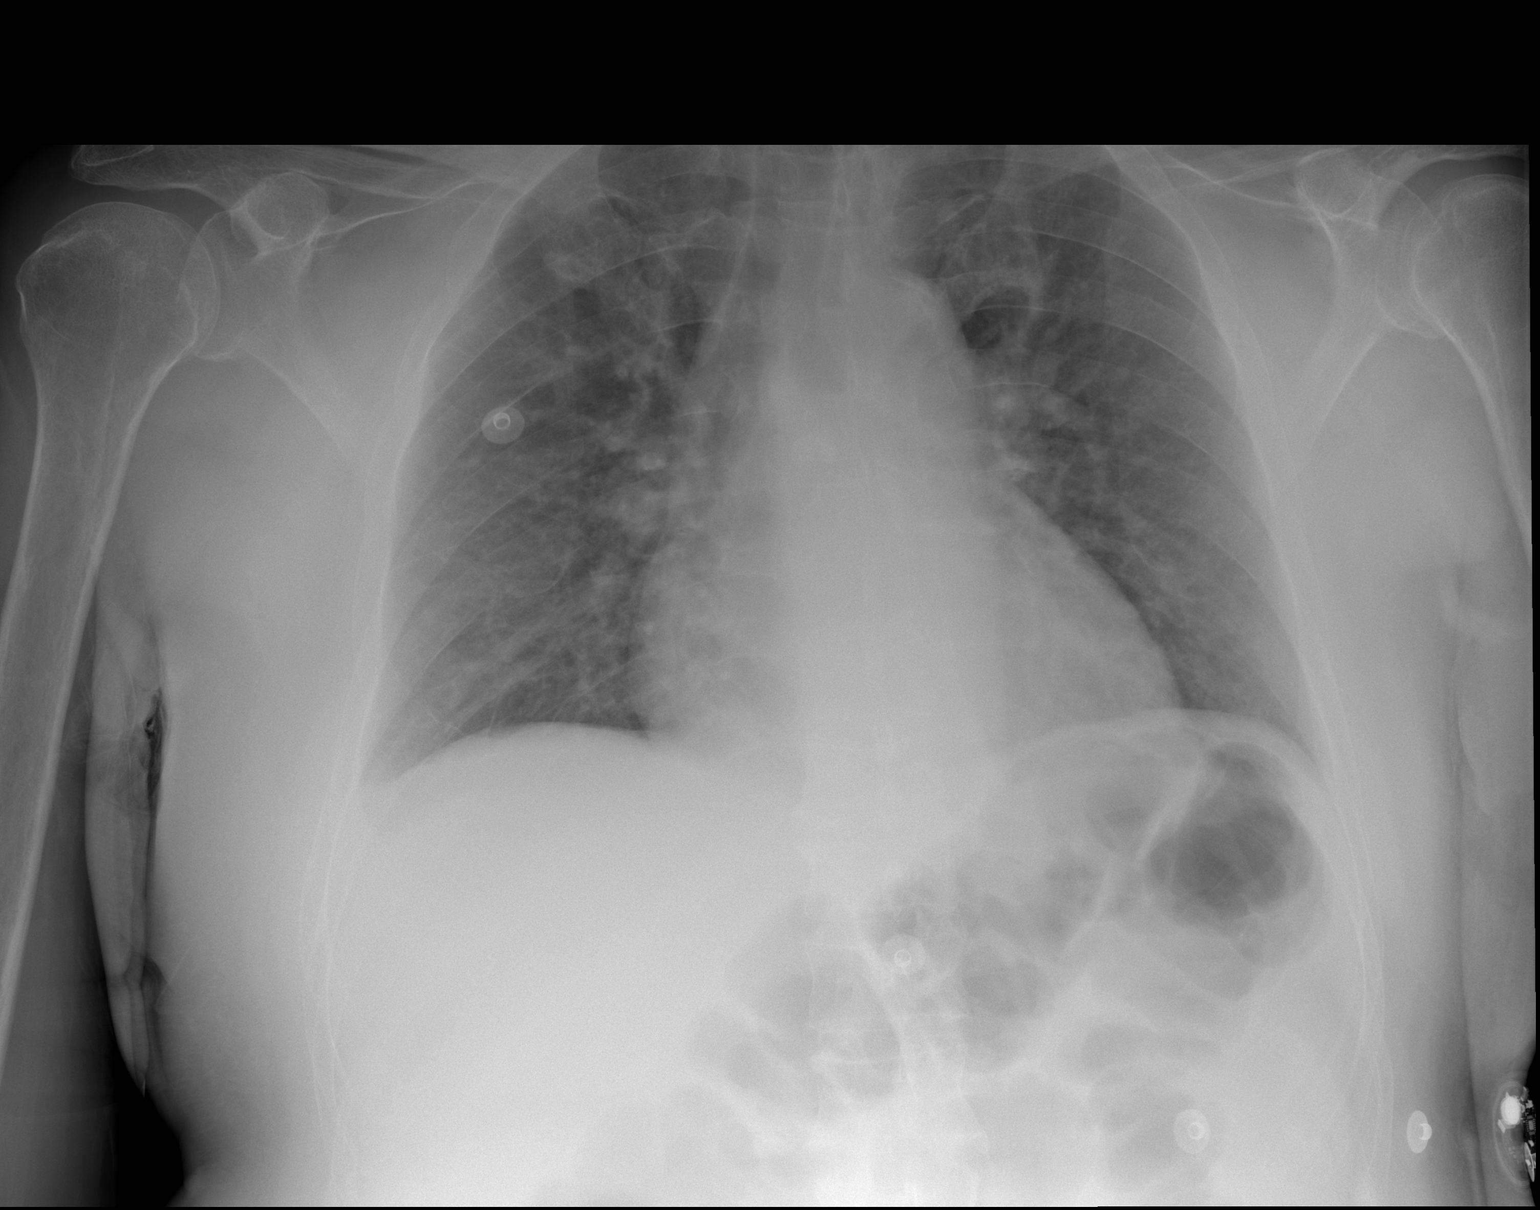

[2 of 2 positions shown; findings below may reference images not displayed]

FINDINGS: The heart size and mediastinal contours are within normal limits.
Mild pulmonary vascular congestion. Trace bilateral pleural
effusions. No consolidation or pneumothorax. No acute osseous
abnormality.
IMPRESSION: 1. Mild pulmonary vascular congestion with trace bilateral pleural
effusions.

## 2023-09-26 ENCOUNTER — Other Ambulatory Visit: Payer: Self-pay | Admitting: Cardiology

## 2023-11-07 ENCOUNTER — Other Ambulatory Visit: Payer: Self-pay | Admitting: Cardiology

## 2023-11-30 ENCOUNTER — Other Ambulatory Visit: Payer: Self-pay | Admitting: Cardiology

## 2024-01-25 ENCOUNTER — Other Ambulatory Visit: Payer: Self-pay | Admitting: Cardiology

## 2024-04-26 IMAGING — MR MR LUMBAR SPINE W/O CM
4 of 5 series · 17 of 48 positions shown · non-contrast
Comparison: None Available.

CLINICAL DATA: Gait abnormality UKF.F (JLW-16-CM). Ataxia,
nontraumatic, lumbar pathology suspected. Ataxia, nontraumatic,
cervical pathology suspected. Other polyneuropathy
(JLW-16-CM). Chronic left-sided low back pain with left-sided
sciatica M54.42, A4L.DL (JLW-16-CM).

EXAM:
MRI CERVICAL AND LUMBAR SPINE WITHOUT CONTRAST
TECHNIQUE: Multiplanar and multiecho pulse sequences of the cervical spine, to
include the craniocervical junction and cervicothoracic junction,
and lumbar spine, were obtained without intravenous contrast.

[Series 5: T2 · sagittal · 4.0mm · 0.73mm/px · 6 of 17 slices shown (1 of 2)]
[im 1/17]
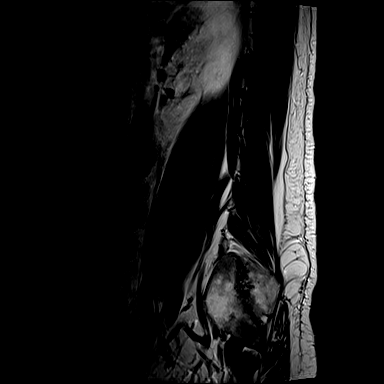
[im 4/17]
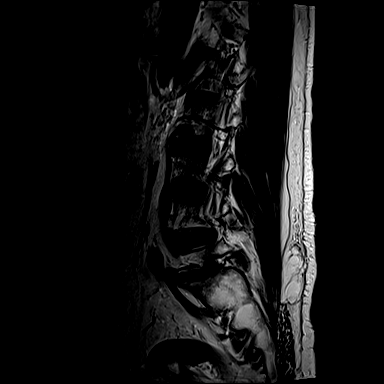
[im 7/17]
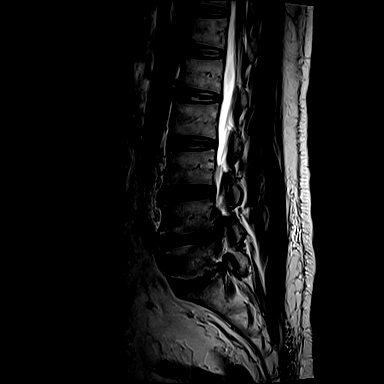
[im 10/17]
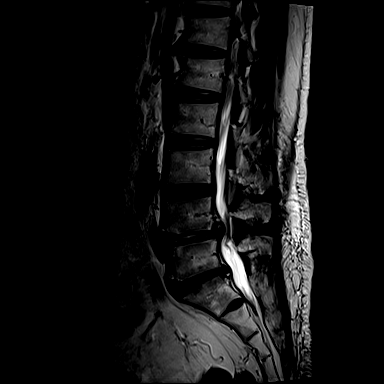
[im 13/17]
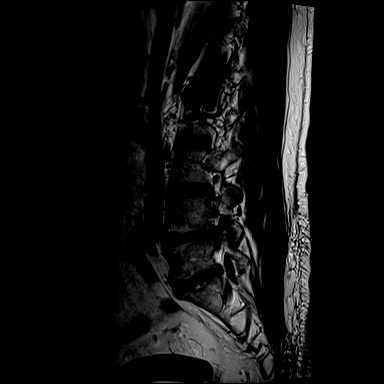
[im 17/17]
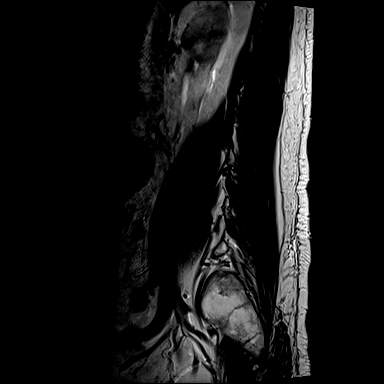

[Series 6: T1 · sagittal · 4.0mm · 0.73mm/px · 3 of 17 slices shown (1 of 2)]
[im 4/17]
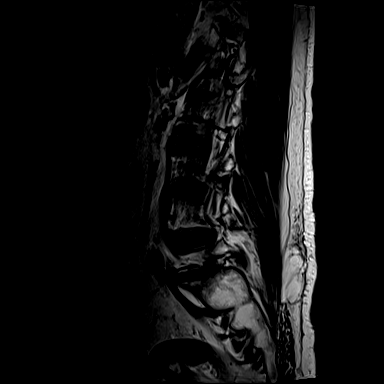
[im 10/17]
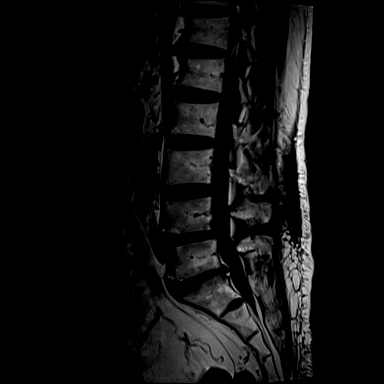
[im 17/17]
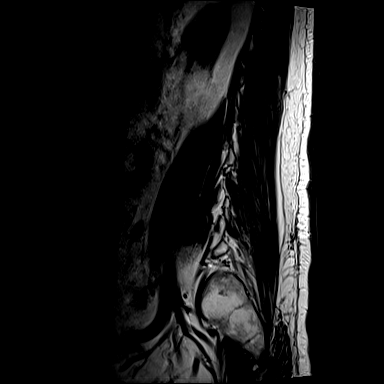

[Series 10: T1 · axial · 4.0mm · 0.28mm/px · z∈[-501,-337]mm · 3 of 41 slices shown (2 of 2)]
[im 6/41]
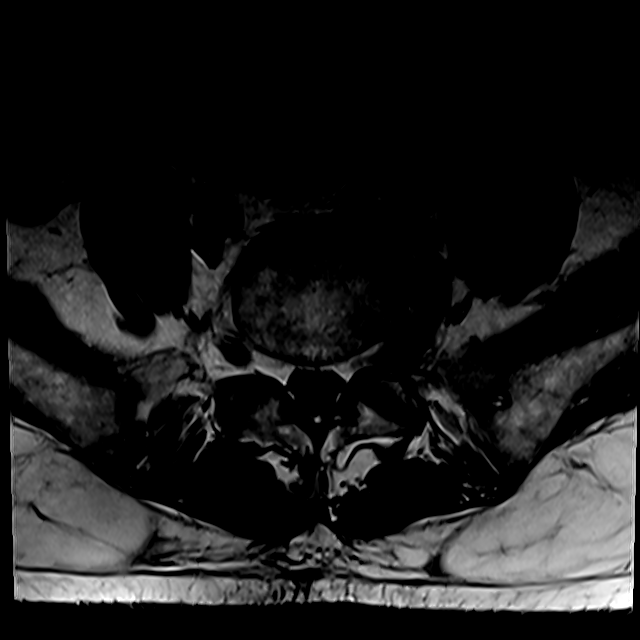
[im 21/41]
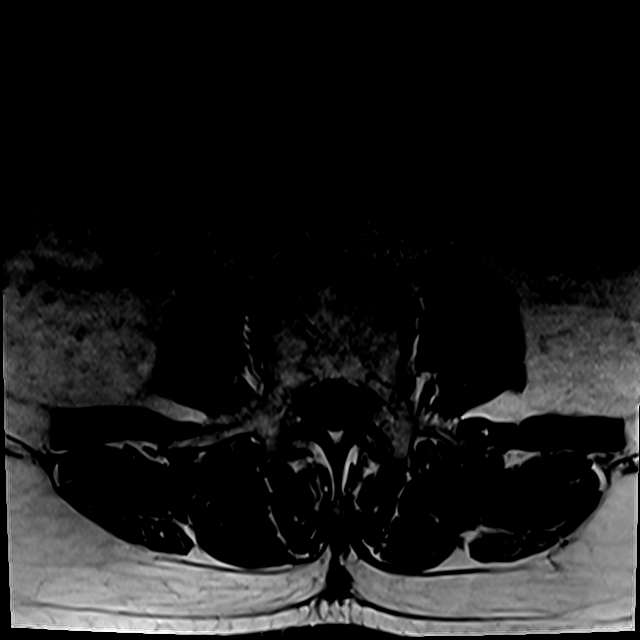
[im 35/41]
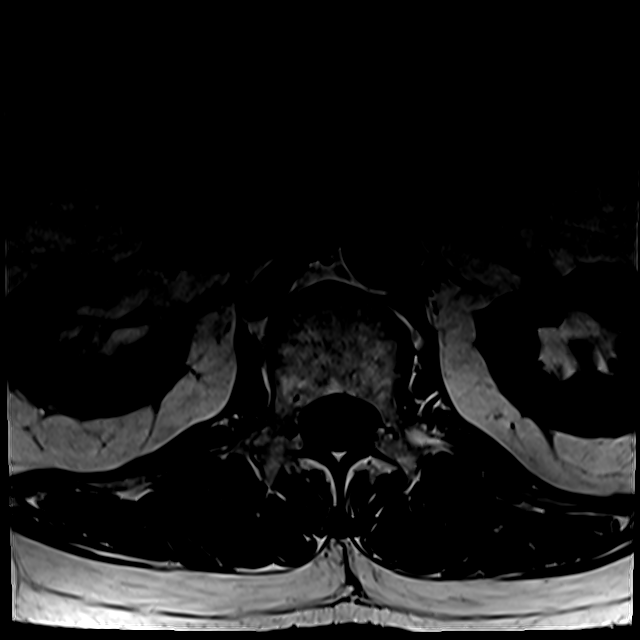

[Series 13: T2 · axial · 4.0mm · 0.28mm/px · z∈[-526,-337]mm · 5 of 41 slices shown (2 of 2)]
[im 1/41]
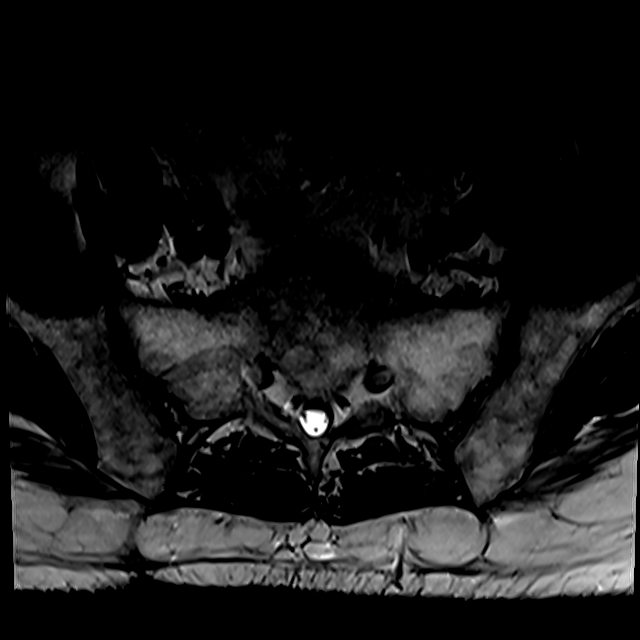
[im 6/41]
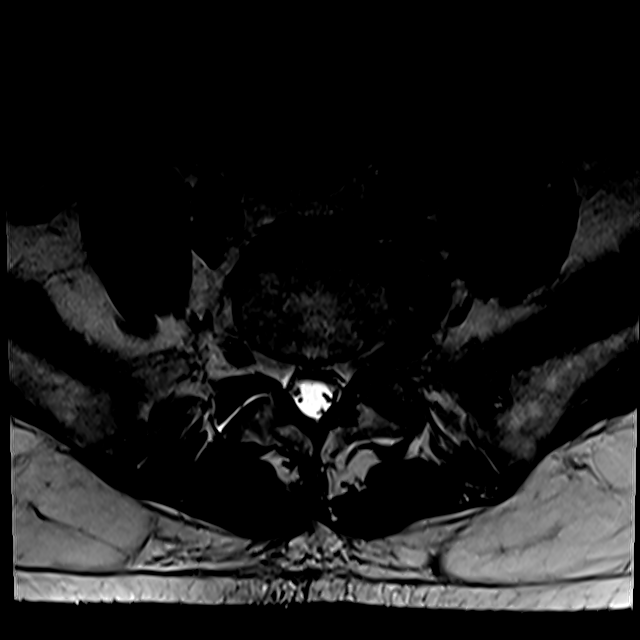
[im 12/41]
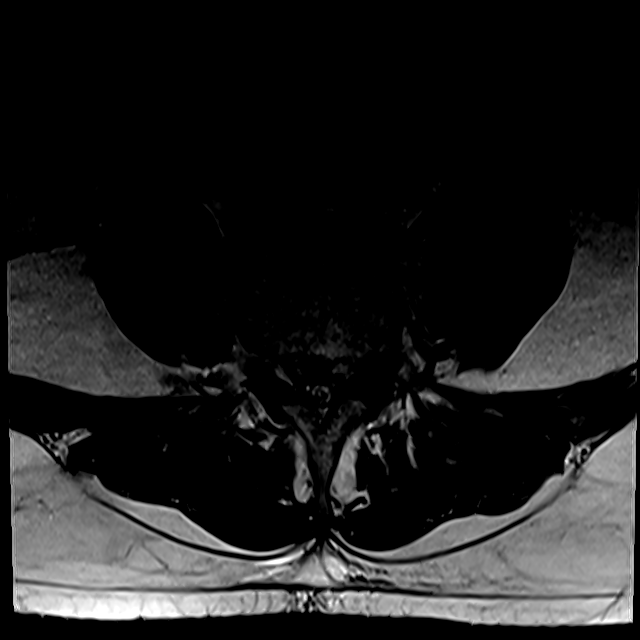
[im 21/41]
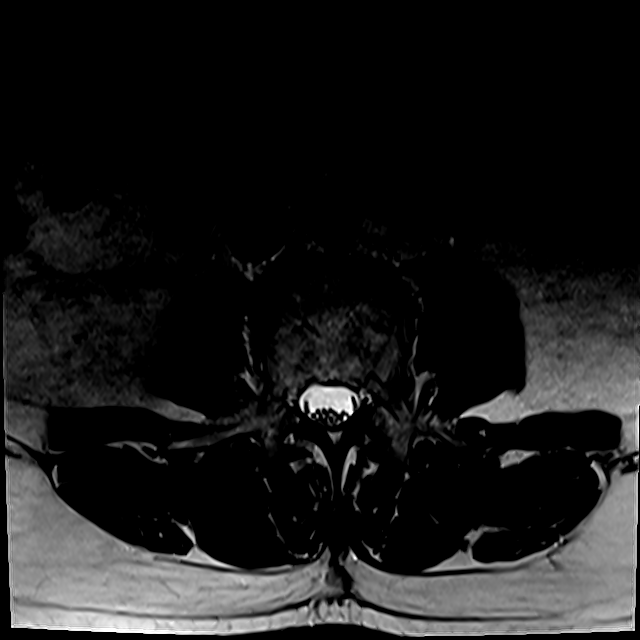
[im 35/41]
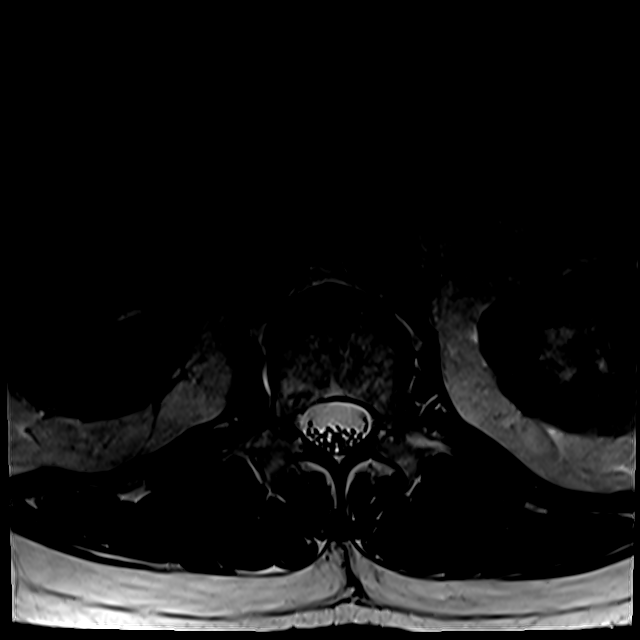

[17 of 48 positions shown; findings below may reference images not displayed]

FINDINGS: MRI CERVICAL SPINE FINDINGS

Alignment: Physiologic.

Vertebrae: No fracture, evidence of discitis, or bone lesion.

Cord: Normal signal and morphology.

Posterior Fossa, vertebral arteries, paraspinal tissues: Negative.

Disc levels:

C2-3: Mild uncovertebral and facet degenerative changes of the mild
left neural foraminal narrowing. No spinal canal stenosis.

C3-4: No spinal canal or neural foraminal stenosis.

C4-5: Small posterior disc protrusion causing indentation on the
thecal sac without significant spinal canal or neural foraminal
stenosis.

C5-6: Small posterior disc protrusion causing indentation on the
thecal sac without significant spinal canal or neural foraminal
stenosis.

C6-7: Small posterior disc protrusion causing indentation on the
thecal sac without significant spinal canal stenosis. Uncovertebral
and facet degenerative changes resulting in moderate left neural
foraminal narrowing.

C7-T1: No spinal canal or neural foraminal stenosis.

MRI LUMBAR SPINE FINDINGS

Segmentation:  Standard.

Alignment:  Trace anterolisthesis of L4 over L5.

Vertebrae:  No fracture, evidence of discitis, or bone lesion.

Conus medullaris and cauda equina: Conus extends to the L1 level.
Conus and cauda equina appear normal.

Paraspinal and other soft tissues: Small left renal cyst.

Disc levels:

T12-L1: No spinal canal or neural foraminal stenosis.

L1-2: No spinal canal or neural foraminal stenosis.

L3-4: No spinal canal or neural foraminal stenosis.

L4-5: Loss of disc height, left asymmetric disc bulge with
superimposed superiorly migrating central disc extrusion, moderate
facet degenerative changes and ligamentum flavum redundancy.
Findings result in severe spinal canal stenosis, mild right and
moderate left neural foraminal narrowing.

L5-S1: Loss of disc height, left asymmetric disc bulge with
associated left foraminal to far lateral osteophytic component,
moderate facet degenerative changes with trace bilateral joint
effusion and mild ligamentum flavum redundancy. Findings result in
narrowing of the bilateral subarticular zones, left greater than
right and severe left neural foraminal narrowing
IMPRESSION: 1. Mild degenerative changes of the cervical spine with moderate
left neural foraminal narrowing at C6-7. No high-grade cervical
spinal canal stenosis.
2. Degenerative changes at L4-5 resulting in severe spinal canal
stenosis, mild right and moderate left neural foraminal narrowing.
3. At L5-S1, narrowing of the bilateral subarticular zones, left
greater than right, and severe left neural foraminal narrowing.

## 2024-08-10 ENCOUNTER — Ambulatory Visit: Attending: Cardiology | Admitting: Cardiology

## 2024-08-10 ENCOUNTER — Encounter: Payer: Self-pay | Admitting: Cardiology

## 2024-08-10 VITALS — BP 183/78 | HR 52 | Ht 67.0 in | Wt 174.0 lb

## 2024-08-10 DIAGNOSIS — N184 Chronic kidney disease, stage 4 (severe): Secondary | ICD-10-CM | POA: Insufficient documentation

## 2024-08-10 DIAGNOSIS — I10 Essential (primary) hypertension: Secondary | ICD-10-CM | POA: Diagnosis not present

## 2024-08-10 MED ORDER — DILTIAZEM HCL ER 60 MG PO CP12: 60.0000 mg | ORAL_CAPSULE | Freq: Two times a day (BID) | ORAL | 3 refills | Status: AC

## 2024-08-10 MED ORDER — CLONIDINE HCL 0.2 MG PO TABS: 0.2000 mg | ORAL_TABLET | Freq: Every evening | ORAL | 3 refills | Status: AC

## 2024-08-10 NOTE — Progress Notes (Signed)
 Cardiology Office Note:  .   Date:  08/10/2024  ID:  Tim Spencer, DOB Jan 14, 1961, MRN [id number] PCP: Ilah Crigler, MD  [facility] Providers Cardiologist:  Newman Lawrence, MD PCP: Ilah Crigler, MD  Chief Complaint  Patient presents with   Hypertension     Tim Spencer is a 63 y.o. male with hypertension, CKD 4, idiopathic progressive neuropathy, orthostatic hypotension/ supine hypertension, hyponatremia.   Discussed the use of AI scribe software for clinical note transcription with the patient, who gave verbal consent to proceed.  History of Present Illness  Patient is here today with his wife.  He has a progressive worsening of his CKD.  He has regular follow-up with Dr. DOROTHA Spencer.  He continues to have problems with blood pressure control, but fortunately has not had any orthostatic hypotension recently.  He denies any chest pain, shortness of breath.  Diabetes is fairly well-controlled.  He has had issues with neuropathy in his feet     Vitals:   08/10/24 1028  BP: (!) 183/78  Pulse: (!) 52  SpO2: 99%      Review of Systems  Cardiovascular:  Negative for chest pain, dyspnea on exertion, leg swelling, palpitations and syncope.        Studies Reviewed: SABRA        EKG 08/10/2024: Sinus bradycardia with 1st degree A-V block Incomplete left bundle branch block Nonspecific T wave abnormality When compared with ECG of 23-Jul-2021 04:55, Incomplete left bundle branch block is now Present      Lexiscan Tetrofosmin stress test 08/23/2021: Lexiscan nuclear stress test performed using 1-day protocol. Stress EKG is non-diagnostic, as this is pharmacological stress test. in addition, rest and stress EKG showed sinus rhythm, inferolateral T wave inversion.  SPECT images show medium sized, mild intensity, reversible perfusion defect in apical to basal, inferior/inferolateral myocardium.  Stress LVEF 50% Low risk study.   Echocardiogram 07/22/2021:  1. Left  ventricular ejection fraction, by estimation, is 55 to 60%. The  left ventricle has normal function. The left ventricle has no regional  wall motion abnormalities. There is mild left ventricular hypertrophy.  Left ventricular diastolic parameters are consistent with Grade II diastolic dysfunction (pseudonormalization).   2. Right ventricular systolic function is normal. The right ventricular  size is normal.   3. Left atrial size was mildly dilated.   4. The mitral valve is normal in structure. Trivial mitral valve  regurgitation. No evidence of mitral stenosis.   5. The aortic valve is tricuspid. Aortic valve regurgitation is not  visualized. No aortic stenosis is present.   6. The inferior vena cava is normal in size with greater than 50%  respiratory variability, suggesting right atrial pressure of 3 mmHg.    Carotid artery duplex  06/20/2020:  Minimal stenosis in the right internal carotid artery (1-15%).  Minimal stenosis in the left internal carotid artery (1-15%).  Mild heterogeneous plaque noted in R>L carotid arteries.  Antegrade right vertebral artery flow. Antegrade left vertebral artery  Flow.  Labs 05/2024: Hb 11.9 Cr 3.71, eGFR 17, Na 136   Physical Exam Vitals and nursing note reviewed.  Constitutional:      General: He is not in acute distress. Neck:     Vascular: No JVD.  Cardiovascular:     Rate and Rhythm: Normal rate and regular rhythm.     Heart sounds: Normal heart sounds. No murmur heard. Pulmonary:     Effort: Pulmonary effort is normal.     Breath sounds:  Normal breath sounds. No wheezing or rales.  Musculoskeletal:     Right lower leg: Edema (Trace) present.     Left lower leg: Edema (Trace) present.      VISIT DIAGNOSES:   ICD-10-CM   1. CKD (chronic kidney disease) stage 4, GFR 15-29 ml/min (HCC)  N18.4 EKG 12-Lead    2. Primary hypertension  I10 EKG 12-Lead    ECHOCARDIOGRAM COMPLETE       Tim Spencer is a 63 y.o. male with  hypertension, CKD 4, idiopathic progressive neuropathy, orthostatic hypotension/ supine hypertension, hyponatremia.   Assessment & Plan  Orthostatic hypotension/supine hypertension: Likely related to autonomic dysfunction in diabetic patient.  Blood pressure elevated. Resume clonidine  at 0.2 mg daily.  Refill diltiazem  SR 60 mg twice daily at patient's request. Also continue carvedilol  25 mg daily. Check echocardiogram.  Type II DM: Continue insulin , statin. In absence of bleeding, determined to continue aspirin  81 mg daily given his risk factors for CAD.  CKD 4: Continue follow-up with nephrology.    Meds ordered this encounter  Medications   cloNIDine  (CATAPRES ) 0.2 MG tablet    Sig: Take 1 tablet (0.2 mg total) by mouth at bedtime.    Dispense:  90 tablet    Refill:  3   diltiazem  (CARDIZEM  SR) 60 MG 12 hr capsule    Sig: Take 1 capsule (60 mg total) by mouth 2 (two) times daily.    Dispense:  180 capsule    Refill:  3     F/u in 3 months  Signed, Newman JINNY Lawrence, MD

## 2024-08-10 NOTE — Patient Instructions (Signed)
 Medication Instructions:  CHANGE Clonidine  to 0.2 mg nightly  CHANGE Cardizem  to Cardizem  SR 60 mg twice daily   STOP Chlorthalidone   *If you need a refill on your cardiac medications before your next appointment, please call your pharmacy*   Testing/Procedures: ECHOCARDIOGRAM  Your physician has requested that you have an echocardiogram. Echocardiography is a painless test that uses sound waves to create images of your heart. It provides your doctor with information about the size and shape of your heart and how well your heart's chambers and valves are working. This procedure takes approximately one hour. There are no restrictions for this procedure. Please do NOT wear cologne, perfume, aftershave, or lotions (deodorant is allowed). Please arrive 15 minutes prior to your appointment time.  Please note: We ask at that you not bring children with you during ultrasound (echo/ vascular) testing. Due to room size and safety concerns, children are not allowed in the ultrasound rooms during exams. Our front office staff cannot provide observation of children in our lobby area while testing is being conducted. An adult accompanying a patient to their appointment will only be allowed in the ultrasound room at the discretion of the ultrasound technician under special circumstances. We apologize for any inconvenience.   Follow-Up: At [facility], you and your health needs are our priority.  As part of our continuing mission to provide you with exceptional heart care, our providers are all part of one team.  This team includes your primary Cardiologist (physician) and Advanced Practice Providers or APPs (Physician Assistants and Nurse Practitioners) who all work together to provide you with the care you need, when you need it.  Your next appointment:   3 month(s)  Provider:   Newman JINNY Lawrence, MD

## 2024-09-21 ENCOUNTER — Ambulatory Visit (HOSPITAL_COMMUNITY)
Admission: RE | Admit: 2024-09-21 | Discharge: 2024-09-21 | Disposition: A | Discharge status: Home / Self Care | Source: Ambulatory Visit | Attending: Internal Medicine | Admitting: Internal Medicine

## 2024-09-21 DIAGNOSIS — I10 Essential (primary) hypertension: Secondary | ICD-10-CM | POA: Insufficient documentation

## 2024-09-21 LAB — ECHOCARDIOGRAM COMPLETE
Area-P 1/2: 4.54 cm2
S' Lateral: 3.2 cm

## 2024-09-22 ENCOUNTER — Ambulatory Visit: Payer: Self-pay | Admitting: Cardiology

## 2024-11-09 ENCOUNTER — Ambulatory Visit: Admitting: Cardiology
